# Patient Record
Sex: Female | Born: 1970 | Race: Black or African American | Hispanic: No | Marital: Married | Smoking: Never smoker
Health system: Southern US, Community
[De-identification: ages and names within clinical notes are randomized; demographics above are authoritative.]

## PROBLEM LIST (undated history)

## (undated) DIAGNOSIS — T7840XA Allergy, unspecified, initial encounter: Secondary | ICD-10-CM

## (undated) DIAGNOSIS — D649 Anemia, unspecified: Secondary | ICD-10-CM

## (undated) DIAGNOSIS — N879 Dysplasia of cervix uteri, unspecified: Secondary | ICD-10-CM

## (undated) DIAGNOSIS — F419 Anxiety disorder, unspecified: Secondary | ICD-10-CM

## (undated) DIAGNOSIS — R87619 Unspecified abnormal cytological findings in specimens from cervix uteri: Secondary | ICD-10-CM

## (undated) DIAGNOSIS — I10 Essential (primary) hypertension: Secondary | ICD-10-CM

## (undated) DIAGNOSIS — R519 Headache, unspecified: Secondary | ICD-10-CM

## (undated) DIAGNOSIS — K219 Gastro-esophageal reflux disease without esophagitis: Secondary | ICD-10-CM

## (undated) DIAGNOSIS — K5792 Diverticulitis of intestine, part unspecified, without perforation or abscess without bleeding: Secondary | ICD-10-CM

## (undated) DIAGNOSIS — N946 Dysmenorrhea, unspecified: Secondary | ICD-10-CM

## (undated) DIAGNOSIS — F329 Major depressive disorder, single episode, unspecified: Secondary | ICD-10-CM

## (undated) DIAGNOSIS — J45909 Unspecified asthma, uncomplicated: Secondary | ICD-10-CM

## (undated) DIAGNOSIS — M1990 Unspecified osteoarthritis, unspecified site: Secondary | ICD-10-CM

## (undated) DIAGNOSIS — A64 Unspecified sexually transmitted disease: Secondary | ICD-10-CM

## (undated) DIAGNOSIS — F32A Depression, unspecified: Secondary | ICD-10-CM

## (undated) DIAGNOSIS — R51 Headache: Secondary | ICD-10-CM

## (undated) DIAGNOSIS — K589 Irritable bowel syndrome without diarrhea: Secondary | ICD-10-CM

## (undated) HISTORY — DX: Anxiety disorder, unspecified: F41.9

## (undated) HISTORY — DX: Dysmenorrhea, unspecified: N94.6

## (undated) HISTORY — DX: Essential (primary) hypertension: I10

## (undated) HISTORY — PX: COLONOSCOPY: SHX174

## (undated) HISTORY — DX: Unspecified osteoarthritis, unspecified site: M19.90

## (undated) HISTORY — DX: Unspecified abnormal cytological findings in specimens from cervix uteri: R87.619

## (undated) HISTORY — DX: Anemia, unspecified: D64.9

## (undated) HISTORY — PX: ADENOIDECTOMY: SUR15

## (undated) HISTORY — DX: Gastro-esophageal reflux disease without esophagitis: K21.9

## (undated) HISTORY — PX: TUBAL LIGATION: SHX77

## (undated) HISTORY — DX: Depression, unspecified: F32.A

## (undated) HISTORY — DX: Diverticulitis of intestine, part unspecified, without perforation or abscess without bleeding: K57.92

## (undated) HISTORY — DX: Irritable bowel syndrome, unspecified: K58.9

## (undated) HISTORY — PX: ABDOMINAL HYSTERECTOMY: SHX81

## (undated) HISTORY — DX: Allergy, unspecified, initial encounter: T78.40XA

## (undated) HISTORY — DX: Unspecified sexually transmitted disease: A64

## (undated) HISTORY — DX: Major depressive disorder, single episode, unspecified: F32.9

## (undated) HISTORY — PX: TONSILLECTOMY: SUR1361

---
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
## 1990-08-06 HISTORY — PX: COLPOSCOPY: SHX161

## 1991-08-07 DIAGNOSIS — N879 Dysplasia of cervix uteri, unspecified: Secondary | ICD-10-CM

## 1991-08-07 HISTORY — DX: Dysplasia of cervix uteri, unspecified: N87.9

## 2002-02-17 ENCOUNTER — Other Ambulatory Visit: Admission: RE | Admit: 2002-02-17 | Discharge: 2002-02-17 | Payer: Self-pay | Admitting: *Deleted

## 2014-03-26 ENCOUNTER — Ambulatory Visit: Payer: Self-pay | Admitting: Obstetrics and Gynecology

## 2015-03-03 ENCOUNTER — Other Ambulatory Visit: Payer: Self-pay | Admitting: *Deleted

## 2015-03-03 DIAGNOSIS — Z1231 Encounter for screening mammogram for malignant neoplasm of breast: Secondary | ICD-10-CM

## 2015-03-28 ENCOUNTER — Ambulatory Visit: Payer: Self-pay | Attending: Family Medicine

## 2015-06-03 ENCOUNTER — Ambulatory Visit
Admission: RE | Admit: 2015-06-03 | Discharge: 2015-06-03 | Disposition: A | Discharge status: Home / Self Care | Payer: Managed Care, Other (non HMO) | Source: Ambulatory Visit | Attending: Family Medicine | Admitting: Family Medicine

## 2015-06-03 ENCOUNTER — Other Ambulatory Visit: Payer: Self-pay | Admitting: Family Medicine

## 2015-06-03 DIAGNOSIS — R7611 Nonspecific reaction to tuberculin skin test without active tuberculosis: Secondary | ICD-10-CM

## 2015-08-02 ENCOUNTER — Ambulatory Visit
Admission: RE | Admit: 2015-08-02 | Discharge: 2015-08-02 | Disposition: A | Discharge status: Home / Self Care | Payer: Managed Care, Other (non HMO) | Source: Ambulatory Visit | Attending: Family Medicine | Admitting: Family Medicine

## 2015-08-02 DIAGNOSIS — Z1231 Encounter for screening mammogram for malignant neoplasm of breast: Secondary | ICD-10-CM | POA: Insufficient documentation

## 2015-11-11 ENCOUNTER — Ambulatory Visit (INDEPENDENT_AMBULATORY_CARE_PROVIDER_SITE_OTHER): Payer: 59 | Admitting: Primary Care

## 2015-11-11 ENCOUNTER — Encounter: Payer: Self-pay | Admitting: Primary Care

## 2015-11-11 VITALS — BP 148/98 | HR 72 | Temp 97.7°F | Ht 62.0 in | Wt 211.0 lb

## 2015-11-11 DIAGNOSIS — K219 Gastro-esophageal reflux disease without esophagitis: Secondary | ICD-10-CM | POA: Diagnosis not present

## 2015-11-11 DIAGNOSIS — I10 Essential (primary) hypertension: Secondary | ICD-10-CM | POA: Diagnosis not present

## 2015-11-11 MED ORDER — HYDROCHLOROTHIAZIDE 25 MG PO TABS: 25.0000 mg | ORAL_TABLET | Freq: Every day | ORAL | Status: DC

## 2015-11-11 NOTE — Progress Notes (Signed)
Pre visit review using our clinic review tool, if applicable. No additional management support is needed unless otherwise documented below in the visit note. 

## 2015-11-11 NOTE — Patient Instructions (Signed)
Start Hydrochlorothiazide 25 mg tablets for hypertension. Take 1 tablet by mouth once every morning.  Check your blood pressure daily, around the same time of day, for the next 2 weeks.   Ensure that you have rested for 30 minutes prior to checking your blood pressure. Record your readings and bring them to your next visit.  Schedule a follow up appointment in 2 weeks for re-evaluation.   It was a pleasure to meet you today! Please don't hesitate to call me with any questions. Welcome to [facility]!  DASH Eating Plan DASH stands for "Dietary Approaches to Stop Hypertension." The DASH eating plan is a healthy eating plan that has been shown to reduce high blood pressure (hypertension). Additional health benefits may include reducing the risk of type 2 diabetes mellitus, heart disease, and stroke. The DASH eating plan may also help with weight loss. WHAT DO I NEED TO KNOW ABOUT THE DASH EATING PLAN? For the DASH eating plan, you will follow these general guidelines:  Choose foods with a percent daily value for sodium of less than 5% (as listed on the food label).  Use salt-free seasonings or herbs instead of table salt or sea salt.  Check with your health care provider or pharmacist before using salt substitutes.  Eat lower-sodium products, often labeled as "lower sodium" or "no salt added."  Eat fresh foods.  Eat more vegetables, fruits, and low-fat dairy products.  Choose whole grains. Look for the word "whole" as the first word in the ingredient list.  Choose fish and skinless chicken or [place] more often than red meat. Limit fish, poultry, and meat to 6 oz (170 g) each day.  Limit sweets, desserts, sugars, and sugary drinks.  Choose heart-healthy fats.  Limit cheese to 1 oz (28 g) per day.  Eat more home-cooked food and less restaurant, buffet, and fast food.  Limit fried foods.  Cook foods using methods other than frying.  Limit canned vegetables. If you do use them, rinse  them well to decrease the sodium.  When eating at a restaurant, ask that your food be prepared with less salt, or no salt if possible. WHAT FOODS CAN I EAT? Seek help from a dietitian for individual calorie needs. Grains Whole grain or whole wheat bread. Brown rice. Whole grain or whole wheat pasta. Quinoa, bulgur, and whole grain cereals. Low-sodium cereals. Corn or whole wheat flour tortillas. Whole grain cornbread. Whole grain crackers. Low-sodium crackers. Vegetables Fresh or frozen vegetables (raw, steamed, roasted, or grilled). Low-sodium or reduced-sodium tomato and vegetable juices. Low-sodium or reduced-sodium tomato sauce and paste. Low-sodium or reduced-sodium canned vegetables.  Fruits All fresh, canned (in natural juice), or frozen fruits. Meat and Other Protein Products Ground beef (85% or leaner), grass-fed beef, or beef trimmed of fat. Skinless chicken or [place]. Ground chicken or [place]. Pork trimmed of fat. All fish and seafood. Eggs. Dried beans, peas, or lentils. Unsalted nuts and seeds. Unsalted canned beans. Dairy Low-fat dairy products, such as skim or 1% milk, 2% or reduced-fat cheeses, low-fat ricotta or cottage cheese, or plain low-fat yogurt. Low-sodium or reduced-sodium cheeses. Fats and Oils Tub margarines without trans fats. Light or reduced-fat mayonnaise and salad dressings (reduced sodium). Avocado. Safflower, olive, or canola oils. Natural peanut or almond butter. Other Unsalted popcorn and pretzels. The items listed above may not be a complete list of recommended foods or beverages. Contact your dietitian for more options. WHAT FOODS ARE NOT RECOMMENDED? Grains White bread. White pasta. White rice. Refined cornbread.  Bagels and croissants. Crackers that contain trans fat. Vegetables Creamed or fried vegetables. Vegetables in a cheese sauce. Regular canned vegetables. Regular canned tomato sauce and paste. Regular tomato and vegetable juices. Fruits Dried  fruits. Canned fruit in light or heavy syrup. Fruit juice. Meat and Other Protein Products Fatty cuts of meat. Ribs, chicken wings, bacon, sausage, bologna, salami, chitterlings, fatback, hot dogs, bratwurst, and packaged luncheon meats. Salted nuts and seeds. Canned beans with salt. Dairy Whole or 2% milk, cream, half-and-half, and cream cheese. Whole-fat or sweetened yogurt. Full-fat cheeses or blue cheese. Nondairy creamers and whipped toppings. Processed cheese, cheese spreads, or cheese curds. Condiments Onion and garlic salt, seasoned salt, table salt, and sea salt. Canned and packaged gravies. Worcestershire sauce. Tartar sauce. Barbecue sauce. Teriyaki sauce. Soy sauce, including reduced sodium. Steak sauce. Fish sauce. Oyster sauce. Cocktail sauce. Horseradish. Ketchup and mustard. Meat flavorings and tenderizers. Bouillon cubes. Hot sauce. Tabasco sauce. Marinades. Taco seasonings. Relishes. Fats and Oils Butter, stick margarine, lard, shortening, ghee, and bacon fat. Coconut, palm kernel, or palm oils. Regular salad dressings. Other Pickles and olives. Salted popcorn and pretzels. The items listed above may not be a complete list of foods and beverages to avoid. Contact your dietitian for more information. WHERE CAN I FIND MORE INFORMATION? National Heart, Lung, and Blood Institute: CableProm[employer].itwww.nhlbi.nih.gov/health/health-topics/topics/dash/   This information is not intended to replace advice given to you by your health care provider. Make sure you discuss any questions you have with your health care provider.   Document Released: 07/12/2011 Document Revised: 08/13/2014 Document Reviewed: 05/27/2013 Elsevier Interactive Patient Education [employer] Elsevier Inc.

## 2015-11-11 NOTE — Progress Notes (Signed)
Subjective:    Patient ID: Kristen [name], female    DOB: 1971/05/15, 45 y.o.   MRN: [id number]  HPI  Kristen [name] is a 45 year old female who presents today to establish care and discuss the problems mentioned below. Will obtain old records. Her last physical was August 2017.   1) Elevated Blood Pressure: History of elevated readings for the past 7 years after postpartum with her daughter. She was told by her prior PCP that she needed to lose weight. She was provided with samples of an unknown BP medication that caused dizziness. She has a family history of hypertension in her mother and grandmother. She endorses a poor diet and does not exercise.  Yesterday at work she started to experience dizziness, headache, and feeling "off". She had her BP checked at work shortly after symptom onset which was 168/108, and 150/102 several hours later upon recheck. Last night she checked her BP when she got home which was 146/98. This morning her blood pressure was 136/98 and then140/94 this afternoon. Today she's continued to notice her symptoms of neck pain, feeling "off" and headache. Denies chest pain.   2) GERD: Diagnosed in 2012. She takes pantoprazole 40 mg as needed for acid reflux and has been on since 2013. She tries to control her diet to prevent symptoms. her symptoms consist of severe esophageal burning, mostly at night occasionally. She will experience attacks twice monthly.   3) IBS: Constipation type. Diagnosed in 2015. She was once managed on samples of Linzess but has no constipation issues now. Her symptoms consist of abdominal cramping and bloating. She will experience these symptoms once to twice weekly depending on her diet.  Review of Systems  Respiratory: Negative for shortness of breath.   Cardiovascular: Negative for chest pain.  Gastrointestinal: Negative for abdominal pain and constipation.  Neurological: Positive for headaches. Negative for dizziness and numbness.       Past  Medical History  Diagnosis Date  . GERD (gastroesophageal reflux disease)   . Essential hypertension   . IBS (irritable bowel syndrome)     Social History   Social History  . Marital Status: Legally Separated    Spouse Name: N/A  . Number of Children: N/A  . Years of Education: N/A   Occupational History  . Not on file.   Social History Main Topics  . Smoking status: Never Smoker   . Smokeless tobacco: Not on file  . Alcohol Use: No  . Drug Use: No  . Sexual Activity: Not on file   Other Topics Concern  . Not on file   Social History Narrative   Separated.    3 children.    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     No past surgical history on file.  Family History  Problem Relation Age of Onset  . Hypertension Mother   . Diabetes Maternal Grandmother   . Hypertension Maternal Grandmother   . Colon cancer Father [phone]  . Depression Mother     Allergies  Allergen Reactions  . Penicillin G Rash    No current outpatient prescriptions on file prior to visit.   No current facility-administered medications on file prior to visit.    BP 148/98 mmHg  Pulse 72  Temp(Src) 97.7 F (36.5 C) (Oral)  Ht 5\' 2"  (1.575 m)  Wt 211 lb (95.709 kg)  BMI 38.58 kg/m2  SpO2 98%  LMP 11/06/2015  Objective:   Physical Exam  Constitutional: She appears well-nourished.  Neck: Neck supple.  Cardiovascular: Normal rate and regular rhythm.   Pulmonary/Chest: Effort normal and breath sounds normal.  Skin: Skin is warm and dry.  Psychiatric: She has a normal mood and affect.          Assessment & Plan:

## 2015-11-11 NOTE — Assessment & Plan Note (Signed)
Long history of elevated readings. BP above goal with readings she brought from recent episode. BP above goal in clinic. Will initiate HCTZ 25 mg once daily. Will have her monitor BP and follow up in 2 weeks. BMP next visit.

## 2015-11-11 NOTE — Assessment & Plan Note (Signed)
Diagnosed in 2012, managed on pantoprazole PRN. She is aware of triggers and attempts to prevent with her diet. Will continue to monitor.

## 2015-11-14 ENCOUNTER — Ambulatory Visit: Payer: Managed Care, Other (non HMO) | Admitting: Primary Care

## 2015-11-19 DIAGNOSIS — H43812 Vitreous degeneration, left eye: Secondary | ICD-10-CM | POA: Diagnosis not present

## 2015-11-25 ENCOUNTER — Other Ambulatory Visit (INDEPENDENT_AMBULATORY_CARE_PROVIDER_SITE_OTHER): Payer: 59

## 2015-11-25 DIAGNOSIS — I10 Essential (primary) hypertension: Secondary | ICD-10-CM

## 2015-11-25 LAB — BASIC METABOLIC PANEL
BUN: 14 mg/dL (ref 6–23)
CO2: 27 mEq/L (ref 19–32)
Calcium: 9.1 mg/dL (ref 8.4–10.5)
Chloride: 107 mEq/L (ref 96–112)
Creatinine, Ser: 0.86 mg/dL (ref 0.40–1.20)
GFR: 92.05 mL/min (ref >60.00)
Glucose, Bld: 88 mg/dL (ref 70–99)
Potassium: 4.3 mEq/L (ref 3.5–5.1)
Sodium: 140 mEq/L (ref 135–145)

## 2015-11-29 ENCOUNTER — Ambulatory Visit (INDEPENDENT_AMBULATORY_CARE_PROVIDER_SITE_OTHER): Payer: 59 | Admitting: Primary Care

## 2015-11-29 ENCOUNTER — Encounter: Payer: Self-pay | Admitting: Primary Care

## 2015-11-29 VITALS — BP 120/72 | HR 74 | Temp 98.0°F | Ht 62.0 in | Wt 209.4 lb

## 2015-11-29 DIAGNOSIS — G4700 Insomnia, unspecified: Secondary | ICD-10-CM | POA: Diagnosis not present

## 2015-11-29 DIAGNOSIS — I10 Essential (primary) hypertension: Secondary | ICD-10-CM | POA: Diagnosis not present

## 2015-11-29 NOTE — Assessment & Plan Note (Signed)
Improved on HCTZ 25 mg. BMP unremarkable. Continue current regimen, discussed improvements in diet to help with BP levels.

## 2015-11-29 NOTE — Progress Notes (Signed)
Pre visit review using our clinic review tool, if applicable. No additional management support is needed unless otherwise documented below in the visit note. 

## 2015-11-29 NOTE — Progress Notes (Signed)
Subjective:    Patient ID: Kristen [name], female    DOB: Dec 01, 1970, 45 y.o.   MRN: [id number]  HPI  Ms. Kristen [name] is a 45 year old female who presents today for follow up of hypertension. She was diagnosed 2 weeks ago after numerous elevated readings at work and home. She was initiated on HCTZ 25 mg last visit.   Since her last visit she is checking her blood pressure at home and is getting readings of 130's/80 on average. Her BP is stable today in the clinic. She has noticed floaters to her left eye for the past several weeks. She underwent evaluation with her eye doctor 1 week ago which was unremarkable. She is due to see a retinal specialist. Denies chest pain, dizziness, shortness of breath.   2) Insomnia: Long history of difficulty staying asleep. She has no difficulty falling asleep, but will wake up around 2 am every morning. She will get about 4 hours of sleep on average. She has tried melatonin in the past which caused "crazy dreams". She did get a full nights sleep last night after working out.   Review of Systems  Eyes: Positive for visual disturbance.  Respiratory: Negative for shortness of breath.   Cardiovascular: Negative for chest pain and leg swelling.  Neurological: Negative for dizziness and headaches.  Psychiatric/Behavioral: Positive for sleep disturbance. The patient is not nervous/anxious.        Past Medical History  Diagnosis Date  . GERD (gastroesophageal reflux disease)   . Essential hypertension   . IBS (irritable bowel syndrome)      Social History   Social History  . Marital Status: Legally Separated    Spouse Name: N/A  . Number of Children: N/A  . Years of Education: N/A   Occupational History  . Not on file.   Social History Main Topics  . Smoking status: Never Smoker   . Smokeless tobacco: Not on file  . Alcohol Use: No  . Drug Use: No  . Sexual Activity: Not on file   Other Topics Concern  . Not on file   Social History Narrative     Separated.    3 children.    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     No past surgical history on file.  Family History  Problem Relation Age of Onset  . Hypertension Mother   . Diabetes Maternal Grandmother   . Hypertension Maternal Grandmother   . Colon cancer Father [phone]  . Depression Mother     Allergies  Allergen Reactions  . Penicillin G Rash    Current Outpatient Prescriptions on File Prior to Visit  Medication Sig Dispense Refill  . hydrochlorothiazide (HYDRODIURIL) 25 MG tablet Take 1 tablet (25 mg total) by mouth daily. 30 tablet 3  . pantoprazole (PROTONIX) 40 MG tablet Take 40 mg by mouth daily.  5   No current facility-administered medications on file prior to visit.    BP 120/72 mmHg  Pulse 74  Temp(Src) 98 F (36.7 C) (Oral)  Ht 5\' 2"  (1.575 m)  Wt 209 lb 6.4 oz (94.983 kg)  BMI 38.29 kg/m2  SpO2 98%  LMP 11/06/2015    Objective:   Physical Exam  Constitutional: She appears well-nourished.  Cardiovascular: Normal rate and regular rhythm.   Pulmonary/Chest: Effort normal and breath sounds normal.  Skin: Skin is warm and dry.          Assessment &  Plan:

## 2015-11-29 NOTE — Assessment & Plan Note (Signed)
Long history of staying asleep.  Typically sleeps 4 hours per night as she will wake around 2 am. No improvement with Melatonin in the past. Will have her continue work outs as this improved sleep. Discussed importance of bedtime routine. Will have her try Unisom OTC if no improvement.

## 2015-11-29 NOTE — Patient Instructions (Addendum)
Your blood pressure is much improved. Continue HCTZ 25 mg tablets once daily.  Please notify me if no improvement in your sleep. You could try Unisom, which is an over the counter sleep aid.  Follow up in August 2017 for your annual physical.  It was a pleasure to see you today!  DASH Eating Plan DASH stands for "Dietary Approaches to Stop Hypertension." The DASH eating plan is a healthy eating plan that has been shown to reduce high blood pressure (hypertension). Additional health benefits may include reducing the risk of type 2 diabetes mellitus, heart disease, and stroke. The DASH eating plan may also help with weight loss. WHAT DO I NEED TO KNOW ABOUT THE DASH EATING PLAN? For the DASH eating plan, you will follow these general guidelines:  Choose foods with a percent daily value for sodium of less than 5% (as listed on the food label).  Use salt-free seasonings or herbs instead of table salt or sea salt.  Check with your health care provider or pharmacist before using salt substitutes.  Eat lower-sodium products, often labeled as "lower sodium" or "no salt added."  Eat fresh foods.  Eat more vegetables, fruits, and low-fat dairy products.  Choose whole grains. Look for the word "whole" as the first word in the ingredient list.  Choose fish and skinless chicken or [place] more often than red meat. Limit fish, poultry, and meat to 6 oz (170 g) each day.  Limit sweets, desserts, sugars, and sugary drinks.  Choose heart-healthy fats.  Limit cheese to 1 oz (28 g) per day.  Eat more home-cooked food and less restaurant, buffet, and fast food.  Limit fried foods.  Cook foods using methods other than frying.  Limit canned vegetables. If you do use them, rinse them well to decrease the sodium.  When eating at a restaurant, ask that your food be prepared with less salt, or no salt if possible. WHAT FOODS CAN I EAT? Seek help from a dietitian for individual calorie  needs. Grains Whole grain or whole wheat bread. Brown rice. Whole grain or whole wheat pasta. Quinoa, bulgur, and whole grain cereals. Low-sodium cereals. Corn or whole wheat flour tortillas. Whole grain cornbread. Whole grain crackers. Low-sodium crackers. Vegetables Fresh or frozen vegetables (raw, steamed, roasted, or grilled). Low-sodium or reduced-sodium tomato and vegetable juices. Low-sodium or reduced-sodium tomato sauce and paste. Low-sodium or reduced-sodium canned vegetables.  Fruits All fresh, canned (in natural juice), or frozen fruits. Meat and Other Protein Products Ground beef (85% or leaner), grass-fed beef, or beef trimmed of fat. Skinless chicken or [place]. Ground chicken or [place]. Pork trimmed of fat. All fish and seafood. Eggs. Dried beans, peas, or lentils. Unsalted nuts and seeds. Unsalted canned beans. Dairy Low-fat dairy products, such as skim or 1% milk, 2% or reduced-fat cheeses, low-fat ricotta or cottage cheese, or plain low-fat yogurt. Low-sodium or reduced-sodium cheeses. Fats and Oils Tub margarines without trans fats. Light or reduced-fat mayonnaise and salad dressings (reduced sodium). Avocado. Safflower, olive, or canola oils. Natural peanut or almond butter. Other Unsalted popcorn and pretzels. The items listed above may not be a complete list of recommended foods or beverages. Contact your dietitian for more options. WHAT FOODS ARE NOT RECOMMENDED? Grains White bread. White pasta. White rice. Refined cornbread. Bagels and croissants. Crackers that contain trans fat. Vegetables Creamed or fried vegetables. Vegetables in a cheese sauce. Regular canned vegetables. Regular canned tomato sauce and paste. Regular tomato and vegetable juices. Fruits Dried fruits. Canned fruit  in light or heavy syrup. Fruit juice. Meat and Other Protein Products Fatty cuts of meat. Ribs, chicken wings, bacon, sausage, bologna, salami, chitterlings, fatback, hot dogs, bratwurst,  and packaged luncheon meats. Salted nuts and seeds. Canned beans with salt. Dairy Whole or 2% milk, cream, half-and-half, and cream cheese. Whole-fat or sweetened yogurt. Full-fat cheeses or blue cheese. Nondairy creamers and whipped toppings. Processed cheese, cheese spreads, or cheese curds. Condiments Onion and garlic salt, seasoned salt, table salt, and sea salt. Canned and packaged gravies. Worcestershire sauce. Tartar sauce. Barbecue sauce. Teriyaki sauce. Soy sauce, including reduced sodium. Steak sauce. Fish sauce. Oyster sauce. Cocktail sauce. Horseradish. Ketchup and mustard. Meat flavorings and tenderizers. Bouillon cubes. Hot sauce. Tabasco sauce. Marinades. Taco seasonings. Relishes. Fats and Oils Butter, stick margarine, lard, shortening, ghee, and bacon fat. Coconut, palm kernel, or palm oils. Regular salad dressings. Other Pickles and olives. Salted popcorn and pretzels. The items listed above may not be a complete list of foods and beverages to avoid. Contact your dietitian for more information. WHERE CAN I FIND MORE INFORMATION? National Heart, Lung, and Blood Institute: travelstabloid.com   This information is not intended to replace advice given to you by your health care provider. Make sure you discuss any questions you have with your health care provider.   Document Released: 07/12/2011 Document Revised: 08/13/2014 Document Reviewed: 05/27/2013 Elsevier Interactive Patient Education [employer].

## 2015-12-30 ENCOUNTER — Other Ambulatory Visit: Payer: Self-pay | Admitting: Primary Care

## 2015-12-30 ENCOUNTER — Other Ambulatory Visit (INDEPENDENT_AMBULATORY_CARE_PROVIDER_SITE_OTHER): Payer: 59

## 2015-12-30 DIAGNOSIS — I10 Essential (primary) hypertension: Secondary | ICD-10-CM

## 2015-12-30 DIAGNOSIS — H43812 Vitreous degeneration, left eye: Secondary | ICD-10-CM | POA: Diagnosis not present

## 2015-12-30 DIAGNOSIS — Z131 Encounter for screening for diabetes mellitus: Secondary | ICD-10-CM

## 2015-12-30 DIAGNOSIS — H02539 Eyelid retraction unspecified eye, unspecified lid: Secondary | ICD-10-CM | POA: Diagnosis not present

## 2015-12-30 DIAGNOSIS — H43392 Other vitreous opacities, left eye: Secondary | ICD-10-CM | POA: Diagnosis not present

## 2015-12-30 DIAGNOSIS — Z0000 Encounter for general adult medical examination without abnormal findings: Secondary | ICD-10-CM

## 2015-12-30 LAB — LIPID PANEL
Cholesterol: 122 mg/dL (ref 0–200)
HDL: 37.1 mg/dL — ABNORMAL LOW (ref >39.00)
LDL Cholesterol: 69 mg/dL (ref 0–99)
NonHDL: 84.49
Total CHOL/HDL Ratio: 3
Triglycerides: 77 mg/dL (ref 0.0–149.0)
VLDL: 15.4 mg/dL (ref 0.0–40.0)

## 2015-12-30 LAB — HEPATIC FUNCTION PANEL
ALT: 13 U/L (ref 0–35)
AST: 16 U/L (ref 0–37)
Albumin: 4 g/dL (ref 3.5–5.2)
Alkaline Phosphatase: 33 U/L — ABNORMAL LOW (ref 39–117)
Bilirubin, Direct: 0.1 mg/dL (ref 0.0–0.3)
Total Bilirubin: 0.4 mg/dL (ref 0.2–1.2)
Total Protein: 7.1 g/dL (ref 6.0–8.3)

## 2015-12-30 LAB — HEMOGLOBIN A1C: Hgb A1c MFr Bld: 5.3 % (ref 4.6–6.5)

## 2015-12-30 LAB — CBC
HCT: 36.9 % (ref 36.0–46.0)
Hemoglobin: 12.3 g/dL (ref 12.0–15.0)
MCHC: 33.5 g/dL (ref 30.0–36.0)
MCV: 95.7 fl (ref 78.0–100.0)
Platelets: 367 10*3/uL (ref 150.0–400.0)
RBC: 3.85 Mil/uL — ABNORMAL LOW (ref 3.87–5.11)
RDW: 13.2 % (ref 11.5–15.5)
WBC: 6.4 10*3/uL (ref 4.0–10.5)

## 2015-12-30 LAB — VITAMIN D 25 HYDROXY (VIT D DEFICIENCY, FRACTURES): VITD: 32.03 ng/mL (ref 30.00–100.00)

## 2015-12-30 LAB — TSH: TSH: 2.43 u[IU]/mL (ref 0.35–4.50)

## 2016-01-03 ENCOUNTER — Encounter: Payer: Self-pay | Admitting: Primary Care

## 2016-01-03 ENCOUNTER — Ambulatory Visit (INDEPENDENT_AMBULATORY_CARE_PROVIDER_SITE_OTHER): Payer: 59 | Admitting: Primary Care

## 2016-01-03 VITALS — BP 136/86 | HR 75 | Temp 98.1°F | Ht 62.0 in | Wt 210.8 lb

## 2016-01-03 DIAGNOSIS — H0520 Unspecified exophthalmos: Secondary | ICD-10-CM | POA: Diagnosis not present

## 2016-01-03 DIAGNOSIS — I10 Essential (primary) hypertension: Secondary | ICD-10-CM | POA: Diagnosis not present

## 2016-01-03 DIAGNOSIS — Z0000 Encounter for general adult medical examination without abnormal findings: Secondary | ICD-10-CM | POA: Diagnosis not present

## 2016-01-03 NOTE — Assessment & Plan Note (Signed)
Stable on HCTZ 25 mg. Continue same. Discussed the importance of a healthy diet and regular exercise in order for weight loss and to reduce risk of other medical diseases. Commended her on exercise.

## 2016-01-03 NOTE — Progress Notes (Signed)
Pre visit review using our clinic review tool, if applicable. No additional management support is needed unless otherwise documented below in the visit note. 

## 2016-01-03 NOTE — Patient Instructions (Addendum)
It is important that you improve your diet. Please limit carbohydrates in the form of white bread, rice, pasta, cakes, cookies, sugary drinks, fast food, etc. Increase your consumption of fresh fruits and vegetables.  Ensure you are consuming 64 ounces of water daily.  Start exercising. You should be getting 1 hour of moderate intensity exercise 5 days weekly.  Follow up in 1 year for repeat physical or sooner if needed.  It was a pleasure to see you today!  Heart-Healthy Eating Plan Many factors influence your heart health, including eating and exercise habits. Heart (coronary) risk increases with abnormal blood fat (lipid) levels. Heart-healthy meal planning includes limiting unhealthy fats, increasing healthy fats, and making other small dietary changes. This includes maintaining a healthy body weight to help keep lipid levels within a normal range. WHAT IS MY PLAN?  Your health care provider recommends that you:  Get no more than _________% of the total calories in your daily diet from fat.  Limit your intake of saturated fat to less than _________% of your total calories each day.  Limit the amount of cholesterol in your diet to less than _________ mg per day. WHAT TYPES OF FAT SHOULD I CHOOSE?  Choose healthy fats more often. Choose monounsaturated and polyunsaturated fats, such as olive oil and canola oil, flaxseeds, walnuts, almonds, and seeds.  Eat more omega-3 fats. Good choices include salmon, mackerel, sardines, tuna, flaxseed oil, and ground flaxseeds. Aim to eat fish at least two times each week.  Limit saturated fats. Saturated fats are primarily found in animal products, such as meats, butter, and cream. Plant sources of saturated fats include palm oil, palm kernel oil, and coconut oil.  Avoid foods with partially hydrogenated oils in them. These contain trans fats. Examples of foods that contain trans fats are stick margarine, some tub margarines, cookies, crackers, and  other baked goods. WHAT GENERAL GUIDELINES DO I NEED TO FOLLOW?  Check food labels carefully to identify foods with trans fats or high amounts of saturated fat.  Fill one half of your plate with vegetables and green salads. Eat 4-5 servings of vegetables per day. A serving of vegetables equals 1 cup of raw leafy vegetables,  cup of raw or cooked cut-up vegetables, or  cup of vegetable juice.  Fill one fourth of your plate with whole grains. Look for the word "whole" as the first word in the ingredient list.  Fill one fourth of your plate with lean protein foods.  Eat 4-5 servings of fruit per day. A serving of fruit equals one medium whole fruit,  cup of dried fruit,  cup of fresh, frozen, or canned fruit, or  cup of 100% fruit juice.  Eat more foods that contain soluble fiber. Examples of foods that contain this type of fiber are apples, broccoli, carrots, beans, peas, and barley. Aim to get 20-30 g of fiber per day.  Eat more home-cooked food and less restaurant, buffet, and fast food.  Limit or avoid alcohol.  Limit foods that are high in starch and sugar.  Avoid fried foods.  Cook foods by using methods other than frying. Baking, boiling, grilling, and broiling are all great options. Other fat-reducing suggestions include:  Removing the skin from poultry.  Removing all visible fats from meats.  Skimming the fat off of stews, soups, and gravies before serving them.  Steaming vegetables in water or broth.  Lose weight if you are overweight. Losing just 5-10% of your initial body weight can help your  overall health and prevent diseases such as diabetes and heart disease.  Increase your consumption of nuts, legumes, and seeds to 4-5 servings per week. One serving of dried beans or legumes equals  cup after being cooked, one serving of nuts equals 1 ounces, and one serving of seeds equals  ounce or 1 tablespoon.  You may need to monitor your salt (sodium) intake,  especially if you have high blood pressure. Talk with your health care provider or dietitian to get more information about reducing sodium. WHAT FOODS CAN I EAT? Grains Breads, including [place], white, pita, wheat, raisin, rye, oatmeal, and [place]. Tortillas that are neither fried nor made with lard or trans fat. Low-fat rolls, including hotdog and hamburger buns and English muffins. Biscuits. Muffins. Waffles. Pancakes. Light popcorn. Whole-grain cereals. Flatbread. Melba toast. Pretzels. Breadsticks. Rusks. Low-fat snacks and crackers, including oyster, saltine, matzo, graham, animal, and rye. Rice and pasta, including brown rice and those that are made with whole wheat. Vegetables All vegetables. Fruits All fruits, but limit coconut. Meats and Other Protein Sources Lean, well-trimmed beef, veal, pork, and lamb. Chicken and [place] without skin. All fish and shellfish. Wild duck, rabbit, pheasant, and venison. Egg whites or low-cholesterol egg substitutes. Dried beans, peas, lentils, and tofu.Seeds and most nuts. Dairy Low-fat or nonfat cheeses, including ricotta, string, and mozzarella. Skim or 1% milk that is liquid, powdered, or evaporated. Buttermilk that is made with low-fat milk. Nonfat or low-fat yogurt. Beverages Mineral water. Diet carbonated beverages. Sweets and Desserts Sherbets and fruit ices. Honey, jam, marmalade, jelly, and syrups. Meringues and gelatins. Pure sugar candy, such as hard candy, jelly beans, gumdrops, mints, marshmallows, and small amounts of dark chocolate. [employer]. Eat all sweets and desserts in moderation. Fats and Oils Nonhydrogenated (trans-free) margarines. Vegetable oils, including soybean, sesame, sunflower, olive, peanut, safflower, corn, canola, and cottonseed. Salad dressings or mayonnaise that are made with a vegetable oil. Limit added fats and oils that you use for cooking, baking, salads, and as spreads. Other Cocoa powder. Coffee and tea.  All seasonings and condiments. The items listed above may not be a complete list of recommended foods or beverages. Contact your dietitian for more options. WHAT FOODS ARE NOT RECOMMENDED? Grains Breads that are made with saturated or trans fats, oils, or whole milk. Croissants. Butter rolls. Cheese breads. Sweet rolls. Donuts. Buttered popcorn. Chow mein noodles. High-fat crackers, such as cheese or butter crackers. Meats and Other Protein Sources Fatty meats, such as hotdogs, short ribs, sausage, spareribs, bacon, ribeye roast or steak, and mutton. High-fat deli meats, such as salami and bologna. Caviar. Domestic duck and goose. Organ meats, such as kidney, liver, sweetbreads, brains, gizzard, chitterlings, and heart. Dairy Cream, sour cream, cream cheese, and creamed cottage cheese. Whole milk cheeses, including blue (bleu), [address], [place], [place], [address], [place], [address], [place], [place], and [place]. Whole or 2% milk that is liquid, evaporated, or condensed. Whole buttermilk. Cream sauce or high-fat cheese sauce. Yogurt that is made from whole milk. Beverages Regular sodas and drinks with added sugar. Sweets and Desserts Frosting. Pudding. Cookies. Cakes other than angel food cake. Candy that has milk chocolate or white chocolate, hydrogenated fat, butter, coconut, or unknown ingredients. Buttered syrups. Full-fat ice cream or ice cream drinks. Fats and Oils Gravy that has suet, meat fat, or shortening. Cocoa butter, hydrogenated oils, palm oil, coconut oil, palm kernel oil. These can often be found in baked products, candy, fried foods, nondairy creamers, and whipped toppings. Solid fats and  shortenings, including bacon fat, salt pork, lard, and butter. Nondairy cream substitutes, such as coffee creamers and sour cream substitutes. Salad dressings that are made of unknown oils, cheese, or sour cream. The items listed above may not be a complete list of foods and beverages to avoid.  Contact your dietitian for more information.   This information is not intended to replace advice given to you by your health care provider. Make sure you discuss any questions you have with your health care provider.   Document Released: 05/01/2008 Document Revised: 08/13/2014 Document Reviewed: 01/14/2014 Elsevier Interactive Patient Education [employer]2016 Elsevier Inc.

## 2016-01-03 NOTE — Assessment & Plan Note (Signed)
Td due, will [facility] with [facility] as she was hired in January 2017. Pap and mammogram UTD. Discussed the importance of a healthy diet and regular exercise in order for weight loss and to reduce risk of other medical diseases. Labs unremarkable. Exam with goiter and mild exophthalmos. recently evaluated by optometrist who recommends further testing. TSH unremarkable, T3 and T4 pending.  Follow up in 1 year for repeat physical.

## 2016-01-03 NOTE — Progress Notes (Signed)
Subjective:    Patient ID: Kristen [name], female    DOB: [birth year], 45 y.o.   MRN: [id number]  HPI  Kristen [name] is a 45 year old female who presents today for complete physical.  Immunizations: -Tetanus: Unsure. Believes it was in 2006. -Influenza: Completed in January 2017.   Diet: She endorses a fair diet. Breakfast: Eggs, bacon Lunch: Fast food, sometimes makes healthier choices. Dinner: [employer], [employer]. Snacks: Fruit, applesauce, chips, junk food. Desserts: Once daily Beverages: Water, occasionally sweet tea  Exercise: She was once exercising three days weekly that she recently started. Eye exam: Completed in May 2017. Dental exam: Completed 1 year ago. Pap Smear: Completed August 2016, normal. Mammogram: Completed December 2016.   Review of Systems  Constitutional: Negative for unexpected weight change.  HENT: Negative for rhinorrhea.   Respiratory: Negative for cough and shortness of breath.   Cardiovascular: Negative for chest pain.  Gastrointestinal: Negative for diarrhea and constipation.  Genitourinary: Negative for difficulty urinating and menstrual problem.       Regular periods, heavy  Musculoskeletal: Negative for myalgias and arthralgias.  Skin: Negative for rash.  Allergic/Immunologic: Negative for environmental allergies.  Neurological: Negative for dizziness, numbness and headaches.  Psychiatric/Behavioral:       Denies concerns anxiety or depression.       Past Medical History  Diagnosis Date  . GERD (gastroesophageal reflux disease)   . Essential hypertension   . IBS (irritable bowel syndrome)      Social History   Social History  . Marital Status: Legally Separated    Spouse Name: N/A  . Number of Children: N/A  . Years of Education: N/A   Occupational History  . Not on file.   Social History Main Topics  . Smoking status: Never Smoker   . Smokeless tobacco: Not on file  . Alcohol Use: No  . Drug Use: No  . Sexual  Activity: Not on file   Other Topics Concern  . Not on file   Social History Narrative   Separated.    3 children.    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     No past surgical history on file.  Family History  Problem Relation Age of Onset  . Hypertension Mother   . Diabetes Maternal Grandmother   . Hypertension Maternal Grandmother   . Colon cancer Father [id number]  . Depression Mother     Allergies  Allergen Reactions  . Penicillin G Rash    Current Outpatient Prescriptions on File Prior to Visit  Medication Sig Dispense Refill  . hydrochlorothiazide (HYDRODIURIL) 25 MG tablet Take 1 tablet (25 mg total) by mouth daily. 30 tablet 3  . pantoprazole (PROTONIX) 40 MG tablet Take 40 mg by mouth daily.  5   No current facility-administered medications on file prior to visit.    BP 136/86 mmHg  Pulse 75  Temp(Src) 98.1 F (36.7 C) (Oral)  Ht 5\' 2"  (1.575 m)  Wt 210 lb 12.8 oz (95.618 kg)  BMI 38.55 kg/m2  SpO2 98%  LMP 12/22/2015    Objective:   Physical Exam  Constitutional: She is oriented to person, place, and time. She appears well-nourished.  HENT:  Right Ear: Tympanic membrane and ear canal normal.  Left Ear: Tympanic membrane and ear canal normal.  Nose: Nose normal.  Mouth/Throat: Oropharynx is clear and moist.  Eyes: Conjunctivae and EOM are normal. Pupils are equal, round, and reactive  to light.  Neck: Neck supple. No thyromegaly present.  Cardiovascular: Normal rate and regular rhythm.   No murmur heard. Pulmonary/Chest: Effort normal and breath sounds normal. She has no rales.  Abdominal: Soft. Bowel sounds are normal. There is no tenderness.  Musculoskeletal: Normal range of motion.  Lymphadenopathy:    She has no cervical adenopathy.  Neurological: She is alert and oriented to person, place, and time. She has normal reflexes. No cranial nerve deficit.  Skin: Skin is warm and dry. No rash noted.    Psychiatric: She has a normal mood and affect.          Assessment & Plan:

## 2016-01-04 LAB — T4, FREE: Free T4: 0.96 ng/dL (ref 0.60–1.60)

## 2016-01-04 LAB — T3, FREE: T3, Free: 3.2 pg/mL (ref 2.3–4.2)

## 2016-01-10 ENCOUNTER — Encounter: Payer: 59 | Admitting: Primary Care

## 2016-02-29 ENCOUNTER — Encounter: Payer: Self-pay | Admitting: Primary Care

## 2016-03-01 ENCOUNTER — Ambulatory Visit (INDEPENDENT_AMBULATORY_CARE_PROVIDER_SITE_OTHER): Payer: 59 | Admitting: Primary Care

## 2016-03-01 ENCOUNTER — Encounter: Payer: Self-pay | Admitting: Primary Care

## 2016-03-01 VITALS — BP 130/80 | HR 76 | Temp 98.0°F | Ht 62.0 in | Wt 214.1 lb

## 2016-03-01 DIAGNOSIS — R5383 Other fatigue: Secondary | ICD-10-CM | POA: Diagnosis not present

## 2016-03-01 DIAGNOSIS — G4700 Insomnia, unspecified: Secondary | ICD-10-CM | POA: Diagnosis not present

## 2016-03-01 LAB — COMPREHENSIVE METABOLIC PANEL
ALT: 14 U/L (ref 0–35)
AST: 13 U/L (ref 0–37)
Albumin: 3.8 g/dL (ref 3.5–5.2)
Alkaline Phosphatase: 35 U/L — ABNORMAL LOW (ref 39–117)
BUN: 9 mg/dL (ref 6–23)
CO2: 29 mEq/L (ref 19–32)
Calcium: 9 mg/dL (ref 8.4–10.5)
Chloride: 106 mEq/L (ref 96–112)
Creatinine, Ser: 0.9 mg/dL (ref 0.40–1.20)
GFR: 87.24 mL/min (ref >60.00)
Glucose, Bld: 84 mg/dL (ref 70–99)
Potassium: 3.6 mEq/L (ref 3.5–5.1)
Sodium: 139 mEq/L (ref 135–145)
Total Bilirubin: 0.4 mg/dL (ref 0.2–1.2)
Total Protein: 7.2 g/dL (ref 6.0–8.3)

## 2016-03-01 LAB — CBC
HCT: 37.4 % (ref 36.0–46.0)
Hemoglobin: 12.5 g/dL (ref 12.0–15.0)
MCHC: 33.4 g/dL (ref 30.0–36.0)
MCV: 96.4 fl (ref 78.0–100.0)
Platelets: 333 10*3/uL (ref 150.0–400.0)
RBC: 3.88 Mil/uL (ref 3.87–5.11)
RDW: 13.1 % (ref 11.5–15.5)
WBC: 6.7 10*3/uL (ref 4.0–10.5)

## 2016-03-01 LAB — VITAMIN B12: Vitamin B-12: 317 pg/mL (ref 211–911)

## 2016-03-01 LAB — POC URINALSYSI DIPSTICK (AUTOMATED)
Bilirubin, UA: NEGATIVE
Blood, UA: NEGATIVE
Glucose, UA: NEGATIVE
Ketones, UA: NEGATIVE
Leukocytes, UA: NEGATIVE
Nitrite, UA: NEGATIVE
Protein, UA: NEGATIVE
Spec Grav, UA: >=1.03
Urobilinogen, UA: NEGATIVE
pH, UA: 6

## 2016-03-01 LAB — IBC PANEL
Iron: 119 ug/dL (ref 42–145)
Saturation Ratios: 32.3 % (ref 20.0–50.0)
Transferrin: 263 mg/dL (ref 212.0–360.0)

## 2016-03-01 MED ORDER — TRAZODONE HCL 50 MG PO TABS: 25.0000 mg | ORAL_TABLET | Freq: Every evening | ORAL | 1 refills | Status: DC | PRN

## 2016-03-01 NOTE — Progress Notes (Signed)
Pre visit review using our clinic review tool, if applicable. No additional management support is needed unless otherwise documented below in the visit note. 

## 2016-03-01 NOTE — Patient Instructions (Signed)
Complete lab work prior to leaving today. I will notify you of your results once received.   Start Trazodone tablets for insomnia. Take 1/2 to 1 tablet by mouth at bedtime. We are starting you at a low dose so we can always move up.  Please notify me if no improvement in fatigue in 1-2 weeks.  It was a pleasure to see you today!

## 2016-03-01 NOTE — Assessment & Plan Note (Signed)
Present for 1 week, also with some difficulty getting her words out. Neuro exam completely unremarkable. TSH, CBC, electrolytes and may unremarkable. We'll check vitamin B12, CBC, CMP to ensure no changes. Suspect this could be attributed to anxiety that's uncontrolled. Could also be attributed to insomnia. Will treat insomnia for now and have her follow-up if no improvement. At that point we'll consider treatment for anxiety.

## 2016-03-01 NOTE — Progress Notes (Signed)
Subjective:    Patient ID: Kristen [name], female    DOB: Dec 03, 1970, 45 y.o.   MRN: [id number]  HPI  Kristen [name] is a 45 year old female with a history of hypertension who presents today with a chief complaint of fatigue. She also reports blurred vision, dizziness, and difficulty get her words out sometimes.   Her symptoms began 1 week ago. She wakes up in the morning and doesn't feel as though she has rested while sleeping. She goes to bed at 10 pm and will wake up at 6 am. She will wake up during the night to urinate and will fall back asleep within 1 hour. She's tried taking Melatonin which did not help, and cannot tolerate benadryl as it causes her to feel "crazy".   Denies increased anxiety, increased stress, increased work hours. She does snore loudly at night, and her daughter has come out of her own bedroom to shut the door to the patient's room due to the loud snoring. She sleeps alone and is unsure if she's experienced apnea. Denies family history of sleep apnea. Epworth Sleepiness Scale of 12 today.  She's undergone numerous eye exams since December 2016. Her contacts were adjusted 1 month ago, but she continues to struggle with her vision. She denies chronic headaches. She will experience dizziness intermittently that has been ongoing over the last year. She will close her eyes which improves her dizziness.  She would like a CT scan of her head as she may have a brain tumor as she has experienced situations within the last 1 week where she cannot get her words out correctly.  2) Generalized anxiety disorder: Diagnosed years ago and once trialed on Wellbutrin, zoloft, paxil, lexapro. Each of these drugs cause her to feel worse than before so she is currently not managed on any medication. She doesn't believe her anxiety is bad enough to go on daily medication. She was once managed by therapy as well.  Review of Systems  Constitutional: Positive for fatigue. Negative for unexpected  weight change.  Eyes:       Chronic visual disturbance since December 2016  Respiratory: Negative for shortness of breath.   Cardiovascular: Negative for chest pain.  Gastrointestinal: Negative for blood in stool.  Endocrine: Negative for cold intolerance and heat intolerance.  Neurological: Negative for weakness, numbness and headaches.       Intermittent dizziness  Psychiatric/Behavioral: Positive for sleep disturbance. Negative for suicidal ideas. The patient is nervous/anxious.        Past Medical History:  Diagnosis Date  . Essential hypertension   . GERD (gastroesophageal reflux disease)   . IBS (irritable bowel syndrome)      Social History   Social History  . Marital status: Legally Separated    Spouse name: N/A  . Number of children: N/A  . Years of education: N/A   Occupational History  . Not on file.   Social History Main Topics  . Smoking status: Never Smoker  . Smokeless tobacco: Not on file  . Alcohol use No  . Drug use: No  . Sexual activity: Not on file   Other Topics Concern  . Not on file   Social History Narrative   Separated.    3 children.    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     No past surgical history on file.  Family History  Problem Relation Age of Onset  . Hypertension  Mother   . Diabetes Maternal Grandmother   . Hypertension Maternal Grandmother   . Colon cancer Father 13  . Depression Mother     Allergies  Allergen Reactions  . Penicillin G Rash    Current Outpatient Prescriptions on File Prior to Visit  Medication Sig Dispense Refill  . hydrochlorothiazide (HYDRODIURIL) 25 MG tablet Take 1 tablet (25 mg total) by mouth daily. 30 tablet 3  . pantoprazole (PROTONIX) 40 MG tablet Take 40 mg by mouth daily as needed.   5   No current facility-administered medications on file prior to visit.     BP 130/80 (BP Location: Left Arm, Patient Position: Sitting, Cuff Size:  Normal)   Pulse 76   Temp 98 F (36.7 C) (Oral)   Ht 5\' 2"  (1.575 m)   Wt 214 lb 1.9 oz (97.1 kg)   SpO2 98%   BMI 39.16 kg/m    Objective:   Physical Exam  Constitutional: She is oriented to person, place, and time. She appears well-nourished.  Eyes: EOM are normal. Pupils are equal, round, and reactive to light.  Neck: Neck supple.  Cardiovascular: Normal rate and regular rhythm.   Pulmonary/Chest: Effort normal and breath sounds normal.  Neurological: She is alert and oriented to person, place, and time. She has normal reflexes. No cranial nerve deficit.  Skin: Skin is warm and dry.  Psychiatric: She has a normal mood and affect.          Assessment & Plan:

## 2016-03-01 NOTE — Assessment & Plan Note (Addendum)
Would like to try medication for sleep. Given history of anxiety and depression mixed with insomnia, will try low-dose trazodone. Discussed possible side effects and instructions on use. We'll have her start with one half tab to 1 tab 1 hour before bedtime for insomnia. She is to notify me if no improvement. Epworth sleepiness scale of 12 today, will offer sleep study as she is at high risk for sleep apnea.Marland Kitchen

## 2016-05-11 ENCOUNTER — Ambulatory Visit (INDEPENDENT_AMBULATORY_CARE_PROVIDER_SITE_OTHER): Payer: 59 | Admitting: Family Medicine

## 2016-05-11 ENCOUNTER — Encounter: Payer: Self-pay | Admitting: Family Medicine

## 2016-05-11 VITALS — BP 126/90 | HR 74 | Temp 97.7°F | Wt 212.1 lb

## 2016-05-11 DIAGNOSIS — J029 Acute pharyngitis, unspecified: Secondary | ICD-10-CM | POA: Diagnosis not present

## 2016-05-11 DIAGNOSIS — B349 Viral infection, unspecified: Secondary | ICD-10-CM | POA: Diagnosis not present

## 2016-05-11 DIAGNOSIS — K120 Recurrent oral aphthae: Secondary | ICD-10-CM | POA: Diagnosis not present

## 2016-05-11 DIAGNOSIS — J020 Streptococcal pharyngitis: Secondary | ICD-10-CM

## 2016-05-11 LAB — POCT RAPID STREP A (OFFICE): Rapid Strep A Screen: NEGATIVE

## 2016-05-11 NOTE — Progress Notes (Signed)
Subjective:     Patient ID: Kristen [name], female   DOB: 02/16/1971, 45 y.o.   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 She presents today with sore throat x 2 days, feels raspy. Has seasonal allergies and initially thought it was from allergies and draining, but if feels worse this morning. She feels fatigued, not achy. She has a 45 year old without symptoms. Takes Claritin as needed- did not take this morning. Took a dayquil this morning without significant relief. Has been having intermittent tooth pain with known wisdom teeth that need removal. Occasional nonproductive cough. No SOB, no wheeze. No fever. No ear pain. 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   Past Medical History:  Diagnosis Date  . Essential hypertension   . GERD (gastroesophageal reflux disease)   . IBS (irritable bowel syndrome)    No past surgical history on file. Family History  Problem Relation Age of Onset  . Hypertension Mother   . Diabetes Maternal Grandmother   . Hypertension Maternal Grandmother   . Colon cancer Father [id number]  . Depression Mother    Social History  Substance Use Topics  . Smoking status: Never Smoker  . Smokeless tobacco: Not on file  . Alcohol use No     Review of Systems Per HPI    Objective:   Physical Exam  Constitutional: She is oriented to person, place, and time. She appears well-developed and well-nourished. No distress.  HENT:  Head: Normocephalic and atraumatic.  Right Ear: Tympanic membrane, external ear and ear canal normal.  Left Ear: Tympanic membrane, external ear and ear canal normal.  Nose: Mucosal edema present.  Mouth/Throat: Uvula is midline and mucous membranes are normal. Posterior oropharyngeal erythema present. No oropharyngeal exudate, posterior oropharyngeal edema or tonsillar abscesses.  Sounds congested, some hoarseness. Right cheek with small ulcer.   Eyes:  Conjunctivae are normal.  Neck: Normal range of motion. Neck supple.  Cardiovascular: Normal rate, regular rhythm and normal heart sounds.   Pulmonary/Chest: Effort normal and breath sounds normal.  Neurological: She is alert and oriented to person, place, and time.  Skin: Skin is warm and dry. She is not diaphoretic.  Psychiatric: She has a normal mood and affect. Her behavior is normal. Judgment and thought content normal.  Vitals reviewed.     BP 126/90   Pulse 74   Temp 97.7 F (36.5 C)   Wt 212 lb 1.9 oz (96.2 kg)   SpO2 98%   BMI 38.80 kg/m  Wt Readings from Last 3 Encounters:  05/11/16 212 lb 1.9 oz (96.2 kg)  03/01/16 214 lb 1.9 oz (97.1 kg)  01/03/16 210 lb 12.8 oz (95.6 kg)  POCT rapid strep- negative  Assessment:     1. Sore throat - Culture, Group A Strep - POCT rapid strep A  - Provided written and verbal information regarding diagnosis and treatment. - suggested ibuprofen 400-600 mg po q 8-12 hours, warm salt water gargles  2. Viral illness - supportive care including rest, hydration, symptomatic relief measures  3. Aphthous ulcer of mouth - warm salt water gargles, avoid irritants   Olean Reeeborah Gessner, FNP-BC  [facility]  05/11/2016 10:47 AM

## 2016-05-11 NOTE — Patient Instructions (Signed)
Take ibuprofen 2-3 tablets every 8-12 hours as needed for pain/fever  Use salt water gargles as needed  Take claritin daily

## 2016-05-13 LAB — CULTURE, GROUP A STREP

## 2016-05-16 ENCOUNTER — Other Ambulatory Visit: Payer: Self-pay | Admitting: Primary Care

## 2016-05-16 MED ORDER — PANTOPRAZOLE SODIUM 40 MG PO TBEC: 40.0000 mg | DELAYED_RELEASE_TABLET | Freq: Every day | ORAL | 0 refills | Status: DC | PRN

## 2016-05-16 MED ORDER — AMOXICILLIN 500 MG PO CAPS: 500.0000 mg | ORAL_CAPSULE | Freq: Two times a day (BID) | ORAL | 0 refills | Status: DC

## 2016-05-16 MED ORDER — PANTOPRAZOLE SODIUM 40 MG PO TBEC: 40.0000 mg | DELAYED_RELEASE_TABLET | Freq: Every day | ORAL | 1 refills | Status: DC | PRN

## 2016-05-16 NOTE — Addendum Note (Signed)
Addended by: Olean ReeGESSNER, Linsi Humann B on: 05/16/2016 10:02 AM   Modules accepted: Orders

## 2016-05-18 ENCOUNTER — Encounter: Payer: Self-pay | Admitting: Primary Care

## 2016-05-18 ENCOUNTER — Ambulatory Visit (INDEPENDENT_AMBULATORY_CARE_PROVIDER_SITE_OTHER): Payer: 59 | Admitting: Primary Care

## 2016-05-18 VITALS — BP 122/70 | HR 77 | Temp 98.2°F | Ht 62.0 in | Wt 212.4 lb

## 2016-05-18 DIAGNOSIS — G4700 Insomnia, unspecified: Secondary | ICD-10-CM

## 2016-05-18 DIAGNOSIS — R5383 Other fatigue: Secondary | ICD-10-CM | POA: Diagnosis not present

## 2016-05-18 DIAGNOSIS — R221 Localized swelling, mass and lump, neck: Secondary | ICD-10-CM

## 2016-05-18 DIAGNOSIS — H539 Unspecified visual disturbance: Secondary | ICD-10-CM

## 2016-05-18 DIAGNOSIS — I10 Essential (primary) hypertension: Secondary | ICD-10-CM

## 2016-05-18 LAB — MAGNESIUM: Magnesium: 2.1 mg/dL (ref 1.5–2.5)

## 2016-05-18 NOTE — Assessment & Plan Note (Signed)
Stable in the office today, continue HCTZ 25 mg. 

## 2016-05-18 NOTE — Progress Notes (Signed)
Pre visit review using our clinic review tool, if applicable. No additional management support is needed unless otherwise documented below in the visit note. 

## 2016-05-18 NOTE — Assessment & Plan Note (Signed)
Ongoing x 1 year. Full work up per optometrist who recommends neurology eval. Neuro exam today unremarkable.  Referral placed for further evaluation.

## 2016-05-18 NOTE — Assessment & Plan Note (Signed)
Some improvement with Trazodone.

## 2016-05-18 NOTE — Progress Notes (Signed)
Subjective:    Patient ID: Kristen [name], female    DOB: [birth year], 45 y.o.   MRN: [id number]  HPI  Ms. Kristen [name] is a 45 year old female who presents today to discuss multiple concerns.  1) Fatigue: Also with difficulty sleeping, exophthalmos (chronic), voice hoariness, thickness to her tongue, mental cloudiness, slight enlargement to the right side of her neck, and increased PMS symptoms. These symptoms have been ongoing for the past 1 year. She under went treatment in May for thyroid disorder with normal TSH, Free T4, and Free T3. She's not recently undergone evaluation with thyroid ultrasound. Denies palpitations, unexplained weight loss or gain, chest pain, heat/cold intolerance.  2) Visual Changes: Ongoing for the past 1 year. Recommend by her Optometrist to see neurology for further evaluation of symptoms. She's had blurred vision, floaters, can't focus on objects. She's tried numerous different powers for contact lenses without improvement.   Review of Systems  Constitutional: Positive for fatigue.  HENT: Positive for voice change. Negative for sore throat and trouble swallowing.        Enlargement to right anterior neck  Eyes: Positive for visual disturbance. Negative for photophobia and pain.  Respiratory: Negative for shortness of breath.   Cardiovascular: Negative for chest pain and palpitations.  Endocrine: Negative for cold intolerance and heat intolerance.  Neurological: Negative for dizziness and weakness.       Mental cloudiness  Psychiatric/Behavioral: Positive for sleep disturbance.       Past Medical History:  Diagnosis Date  . Essential hypertension   . GERD (gastroesophageal reflux disease)   . IBS (irritable bowel syndrome)      Social History   Social History  . Marital status: Legally Separated    Spouse name: N/A  . Number of children: N/A  . Years of education: N/A   Occupational History  . Not on file.   Social History Main Topics  . Smoking  status: Never Smoker  . Smokeless tobacco: Not on file  . Alcohol use No  . Drug use: No  . Sexual activity: Not on file   Other Topics Concern  . Not on file   Social History Narrative   Separated.    3 children.    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     No past surgical history on file.  Family History  Problem Relation Age of Onset  . Hypertension Mother   . Diabetes Maternal Grandmother   . Hypertension Maternal Grandmother   . Colon cancer Father 251  . Depression Mother     Allergies  Allergen Reactions  . Penicillin G Rash    Current Outpatient Prescriptions on File Prior to Visit  Medication Sig Dispense Refill  . hydrochlorothiazide (HYDRODIURIL) 25 MG tablet Take 1 tablet (25 mg total) by mouth daily. 30 tablet 3  . pantoprazole (PROTONIX) 40 MG tablet Take 1 tablet (40 mg total) by mouth daily as needed. 30 tablet 0  . traZODone (DESYREL) 50 MG tablet Take 0.5-1 tablets (25-50 mg total) by mouth at bedtime as needed for sleep. 30 tablet 1   No current facility-administered medications on file prior to visit.     BP 122/70   Pulse 77   Temp 98.2 F (36.8 C) (Oral)   Ht 5\' 2"  (1.575 m)   Wt 212 lb 6.4 oz (96.3 kg)   LMP 05/10/2016   SpO2 97%   BMI 38.85 kg/m  Objective:   Physical Exam  Constitutional: She is oriented to person, place, and time. She appears well-nourished.  Eyes: EOM are normal. Pupils are equal, round, and reactive to light.  Neck: Neck supple.  Mild enlargement noted to right anterior neck. No palpable nodule to thyroid.  Cardiovascular: Normal rate and regular rhythm.   Pulmonary/Chest: Effort normal and breath sounds normal.  Neurological: She is alert and oriented to person, place, and time. No cranial nerve deficit.  Skin: Skin is warm and dry.  Psychiatric: She has a normal mood and affect.          Assessment & Plan:

## 2016-05-18 NOTE — Patient Instructions (Signed)
Complete lab work prior to leaving today. I will notify you of your results once received.   You will be contacted regarding your referral to Neurology.  Please let us know if you have not heard back within one week.   Stop by the front desk and speak with either Shirlee LimerickMarion or Revonda StandardAllison regarding your Ultrasound. I will be in touch with you once I receive these results.  It was a pleasure to see you today!

## 2016-05-18 NOTE — Assessment & Plan Note (Signed)
Also with hoarse voice, exophthalmos (chronic), mental cloudiness. Exam with mild enlargement to right anterior neck. Obtain thyroid ultrasound and labs including TSI, TPO, Magnesium.

## 2016-05-19 LAB — THYROID PEROXIDASE ANTIBODY: Thyroperoxidase Ab SerPl-aCnc: 1 IU/mL (ref <9)

## 2016-05-23 ENCOUNTER — Ambulatory Visit
Admission: RE | Admit: 2016-05-23 | Discharge: 2016-05-23 | Disposition: A | Discharge status: Home / Self Care | Payer: 59 | Source: Ambulatory Visit | Attending: Primary Care | Admitting: Primary Care

## 2016-05-23 DIAGNOSIS — R5383 Other fatigue: Secondary | ICD-10-CM | POA: Insufficient documentation

## 2016-05-23 DIAGNOSIS — R221 Localized swelling, mass and lump, neck: Secondary | ICD-10-CM | POA: Insufficient documentation

## 2016-05-24 LAB — THYROID STIMULATING IMMUNOGLOBULIN: TSI: <89 % baseline (ref <140)

## 2016-06-07 ENCOUNTER — Ambulatory Visit (INDEPENDENT_AMBULATORY_CARE_PROVIDER_SITE_OTHER): Payer: 59 | Admitting: Neurology

## 2016-06-07 ENCOUNTER — Encounter: Payer: Self-pay | Admitting: Neurology

## 2016-06-07 VITALS — BP 132/85 | HR 72 | Ht 62.0 in | Wt 213.0 lb

## 2016-06-07 DIAGNOSIS — H0520 Unspecified exophthalmos: Secondary | ICD-10-CM | POA: Diagnosis not present

## 2016-06-07 DIAGNOSIS — H16223 Keratoconjunctivitis sicca, not specified as Sjogren's, bilateral: Secondary | ICD-10-CM | POA: Diagnosis not present

## 2016-06-07 DIAGNOSIS — R0683 Snoring: Secondary | ICD-10-CM

## 2016-06-07 DIAGNOSIS — H5713 Ocular pain, bilateral: Secondary | ICD-10-CM

## 2016-06-07 DIAGNOSIS — M3501 Sicca syndrome with keratoconjunctivitis: Secondary | ICD-10-CM

## 2016-06-07 DIAGNOSIS — H538 Other visual disturbances: Secondary | ICD-10-CM

## 2016-06-07 MED ORDER — ALPRAZOLAM 0.5 MG PO TABS: 0.5000 mg | ORAL_TABLET | Freq: Every evening | ORAL | 0 refills | Status: DC | PRN

## 2016-06-07 MED ORDER — CYCLOSPORINE 0.05 % OP EMUL: 1.0000 [drp] | Freq: Two times a day (BID) | OPHTHALMIC | 2 refills | Status: DC

## 2016-06-07 NOTE — Progress Notes (Signed)
Provider:  Melvyn Novasarmen  Korena Nass, M D  Referring Provider: Doreene Nestlark, Katherine K, NP Primary Care Physician:  Morrie Sheldonlark,Katherine Kendal, NP  Chief Complaint  Patient presents with  . Eye Problem    rm 11, New Pt, "slurred speech, dizziness, blurred vision"    HPI:  Kristen [name] is a 45 y.o. female  Is seen here as a referral from Dr. Sharen HonesGutierrez for a neurological evaluation of vision changes.   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 She has noted a decline in visual acuity and especially a decline in her night vision ability. She has frequently seen her optometrist but also has been seen by ophthalmologist Dr. Luciana Axeankin, a retina specialist. (His notes are in Epic)  Dr. Caryn BeeKevin Ritter's notes , ([facility] ),  who saw her almost monthly ,were brought in copy  today .  They span a duration between February 13 through September 22- 6 visits in total. Her contact lenses had no longer prevented her vision from blurring and she found it difficult to focus her gaze. She used a lens called to the air optics aqua. Distant vision on the right eye in February was 2060 and on the left 20/20. She was diagnosed as having presbyopia. I could review all her limbs prescriptions and her optometrist also kept a record of all prescription medicines. She was only on Protonix Claritin at the time of these tests. As she still had trouble with focusing of her gaze and vision she returned in March her distant vision felt okay remarked in her chart was that the lens strength was approximately the same as before yet her visual abilities continued to decline. By April the patient had an additional medication prescribed hydrochlorothiazide. For the treatment of hypertension. A full examination on 11/19/2015 showed normal fundi, normal clear macula, and a posterior vitreous detachment on the left. She was still searching for more tolerable lens. By May, eyedrops were  added XIIDRA .  The patient continued to be blurred him a unclear and not sharp. She felt by September that her lenses had become intolerable. She had floaters, she reported poor night vision, redness, and headaches. The eyedrops prescribed were meant to alleviate eye dryness. Her last attempt at a contact lens was the Biofinity-Multifocal which she is wearing today. Her concern is that the eyedrops  workwell but are too expansive, unaffordable to her. She received the current bottle with a coupon- but once this coupon runs out the medication will cost her $200 or more.  She still experiences discomfort with using these lenses, irritation burning plus she is unhappy with her current visual acuity. She was able to read large print at about 8 yards distance but reported that the letters were not sharp at the margin, she also thought the print was black but it is purple.  Review of Systems: Out of a complete 14 system review, the patient complains of only the following symptoms, and all other reviewed systems are negative.  headaches, none now.  The headaches were most often appearing at the end of a days work when she had worn her contact lenses for many many hours, she associated nausea with her headaches, photophobia, and this seems to be a migraine component. Dry eye . Floaters in both eyes, started in the left/  Social History   Social History  . Marital status: Legally Separated    Spouse name: N/A  . Number of children:  3  . Years of education: 84   Occupational History  .      [employer] [place], [employer]   Social History Main Topics  . Smoking status: Never Smoker  . Smokeless tobacco: Never Used  . Alcohol use No     Comment: rare  . Drug use: No  . Sexual activity: Not on file   Other Topics Concern  . Not on file   Social History Narrative   Separated.    3 children.    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      Family History  Problem Relation Age of Onset  . Hypertension Mother   . Depression Mother   . Diabetes Maternal Grandmother   . Hypertension Maternal Grandmother   . Colon cancer Father 4    Past Medical History:  Diagnosis Date  . Essential hypertension   . GERD (gastroesophageal reflux disease)   . IBS (irritable bowel syndrome)     Past Surgical History:  Procedure Laterality Date  . ADENOIDECTOMY     lymph nodes removed  . TONSILLECTOMY    . TUBAL LIGATION      Current Outpatient Prescriptions  Medication Sig Dispense Refill  . hydrochlorothiazide (HYDRODIURIL) 25 MG tablet Take 1 tablet (25 mg total) by mouth daily. 30 tablet 3  . pantoprazole (PROTONIX) 40 MG tablet Take 1 tablet (40 mg total) by mouth daily as needed. 30 tablet 0  . traZODone (DESYREL) 50 MG tablet Take 0.5-1 tablets (25-50 mg total) by mouth at bedtime as needed for sleep. 30 tablet 1   No current facility-administered medications for this visit.     Allergies as of 06/07/2016 - Review Complete 06/07/2016  Allergen Reaction Noted  . Amoxicillin Nausea Only 06/07/2016  . Penicillin g Rash 11/11/2015    Vitals: BP 132/85   Pulse 72   Ht 5\' 2"  (1.575 m)   Wt 213 lb (96.6 kg)   LMP 05/10/2016   BMI 38.96 kg/m  Last Weight:  Wt Readings from Last 1 Encounters:  06/07/16 213 lb (96.6 kg)   Last Height:   Ht Readings from Last 1 Encounters:  06/07/16 5\' 2"  (1.575 m)    Physical exam:  General: The patient is awake, alert and appears not in acute distress. The patient is well groomed. Head: Normocephalic, atraumatic. Neck is supple.  Cardiovascular:  Regular rate and rhythm , without  murmurs or carotid bruit, and without distended neck veins. Respiratory: Lungs are clear to auscultation. Skin:  Without evidence of edema, or rash Trunk: BMI is elevated and patient  has normal posture.  Neurologic exam : The patient is awake and alert, oriented to place and time.  Memory  subjective described as intact.  There is a normal attention span & concentration ability.  Speech is fluent without dysarthria, dysphonia or aphasia. Mood and affect are appropriate.  Cranial nerves: Pupils are equal and briskly reactive to light.  She has occasionally a eye lid tic.  Funduscopic exam without evidence of pallor or edema.  Her pupils constrict to accomodation, she has protruding eyes, no eye pain.  Extraocular movements  in vertical and horizontal planes intact and without nystagmus.  Visual fields by finger perimetry are intact. Hearing to finger rub intact.  Facial sensation intact to fine touch. Facial motor strength is symmetric and tongue and uvula move midline. Tongue protrusion into either cheek is normal. Shoulder shrug is normal.  Motor exam:  Normal tone ,muscle bulk and symmetric strength in all extremities.  Sensory:  Fine touch, pinprick and vibration were tested in all extremities. Proprioception was normal.  Coordination: Rapid alternating movements in the fingers/hands were normal. Finger-to-nose maneuver  normal without evidence of ataxia, dysmetria or tremor.  Gait and station: Patient walks without assistive device and is able unassisted to climb up to the exam table.  Deep tendon reflexes: in the  upper and lower extremities are symmetric and intact..   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 This progression has been documented over the last 6 months with monthly optometrist visits. She has tried multiple contact lenses and still feels that she has not found a good fit, she has blurred vision, unsharp margins when reading, and today here at the office even some trouble with identifying color.  Dr. Luciana Axeankin a retina specialist ophthalmologist had seen the patient  this summer and had mentioned to her that he is concerned about possible Graves' disease. In response to this she has been tested by her primary care provider but all thyroid checks have returned negative.  She also has a history of migraine clearly associated headaches with nausea and photophobia but these have not been present as her vision declines they have not let to diplopia or any visual aura that she can identify. Her loss of visual acuity is unrelated to migraines.  She is fluid extraocular movements, intact accommodation, but clearly visible exophthalmus.  Plan:  Treatment plan and additional workup :  I will order an MRI of the brain, with and without contrast. My goal is to document any retro-orbital pressure, fluid accumulation, or demyelination. For this reason contrast was need to be added. As to the dry eye, they have just occurred over the last 6 months. We should check for rheumatological conditions such as sicca syndrome, she reported also some dry throat difficulties with swallowing, and the feeling of an enlarged neck. Thyroid scan was negative.   I hope that the patient can change to a cheaper dry eye medication, such as Restasis. I will ask for her to return for a revisit in 4 weeks after the MRI is done.  I would much prefer for for for her to change from contact lenses to corrective  glasses as not to have  contact irritation    Melvyn Novasarmen Hashim Eichhorst MD 06/07/2016

## 2016-06-07 NOTE — Addendum Note (Signed)
Addended by: Melvyn NovasHMEIER, Harvy Riera on: 06/07/2016 05:11 PM   Modules accepted: Orders

## 2016-06-07 NOTE — Patient Instructions (Signed)
Keratoconjunctivitis Sicca Keratoconjunctivitis sicca (dry eye) is dryness of the membranes surrounding the eye. It is caused by inadequate production of natural tears. The eyes must remain lubricated at all times. This creates a smooth surface of the cornea (clear covering at the front of the eye), which allows the eyelids to slide over the eye without causing soreness and irritation. A small amount of tears are constantly produced by the tear glands (lacrimal glands). These glands are located under the outside part of the upper eyelids. Dry eyes are often caused by decreased tear production. This condition is called aqueous tear-deficient dry eyes. The tear glands do not produce enough tears to keep the tissues surrounding the eye moist. This condition is common in postmenopausal women. Dry eyes may also be caused by an abnormality of how the tears are made. This can result in faster evaporation of the tears. It is called evaporative dry eyes. Although the tear glands produce enough tears, the rate of evaporation is so fast that the entire eye surface cannot be kept covered with a complete layer of tears. The condition can occur during certain activities or in unusually dry surroundings.  Sometimes dry eyes are symptoms of other diseases that can affect the eyes. Some examples are:   Rheumatoid arthritis.  Systemic lupus erythematosus (lupus). Chronic inflammatory disease, causing the body to attack its own tissue.  Sjogren syndrome. Dryness of the eyes and mouth, sometimes associated with a form of rheumatoid arthritis or lupus. SYMPTOMS  Symptoms of dry eyes include:  Irritation.  Itching.  Redness.  Burning and feeling as though there is something gritty in the eye. If the surface of the eye becomes damaged, sensitivity may increase, along with increased discomfort and sensitivity to bright light. Symptoms are made worse by activities with decreased blinking, such as staring at a  television, book, or computer screen.  Symptoms are also worse in dusty or smoky areas and in dry environments. Certain drugs can make symptoms worse, including antihistamines, tranquilizers, diuretics, antihypertensives (blood pressure medicine), and oral contraceptives. Symptoms tend to improve during cool, rainy, or foggy weather and in humid places, such as in the shower. DIAGNOSIS  Dry eyes are usually diagnosed by symptoms alone. Sometimes a Schirmer test is done, in which a strip of filter paper is placed at the edge of the eyelid. The amount of moisture bathing the eye is measured. This test is done by measuring how far the tears go up a strip of paper applied to the eye in a set amount of time. Your caregiver will use a special microscope to examine the surface of the eye and the cornea to see if there are signs of dryness. TREATMENT  Artificial tears (eye drops made with substances that simulate real tears) applied every few hours can generally control the problem. Lubricating ointments may help more severe cases. Avoiding dry, drafty environments and using humidifiers may also help. Minor surgery can be done to block the flow of tears into the nose, so that more tears are available to bathe the eyes. Patients with evaporative dry eyes may also benefit from treatment of the inflammation (redness and soreness) of the eyelids, which often occurs with the dry eyes. This treatment may include warm compresses, eyelid margin scrubs, or oral medicines. PROGNOSIS  Even with severe dry eyes, it is uncommon to lose vision. At times, it may become difficult to see. Rarely, scarring and ulcers may occur, and blood vessels can grow across the cornea. Scarring and blood  vessel growth can impair vision. In severe cases, the cornea may become damaged or infected. Ulcers or infections of the cornea are serious complications. PREVENTION  There is no way to prevent getting keratoconjunctivitis sicca. However,  complications can be prevented by keeping the eyes as moist as possible with artificial drops, as needed. SEEK IMMEDIATE MEDICAL CARE IF:   Your eye pain gets worse.  You develop a pus-like drainage from the eye.  The eye drops or medicines prescribed by your caregiver are not helping.  You have dry eyes and a sudden increase in discomfort or redness.  You have a sudden decrease in vision.  You have any other questions or concerns.   This information is not intended to replace advice given to you by your health care provider. Make sure you discuss any questions you have with your health care provider.   Document Released: 06/09/2004 Document Revised: 08/13/2014 Document Reviewed: 06/13/2009 Elsevier Interactive Patient Education 2016 Elsevier Inc.  

## 2016-06-08 ENCOUNTER — Other Ambulatory Visit (INDEPENDENT_AMBULATORY_CARE_PROVIDER_SITE_OTHER): Payer: 59

## 2016-06-08 DIAGNOSIS — H538 Other visual disturbances: Secondary | ICD-10-CM | POA: Diagnosis not present

## 2016-06-08 DIAGNOSIS — M3501 Sicca syndrome with keratoconjunctivitis: Secondary | ICD-10-CM

## 2016-06-08 DIAGNOSIS — H0520 Unspecified exophthalmos: Secondary | ICD-10-CM

## 2016-06-08 DIAGNOSIS — H5713 Ocular pain, bilateral: Secondary | ICD-10-CM

## 2016-06-08 DIAGNOSIS — H16223 Keratoconjunctivitis sicca, not specified as Sjogren's, bilateral: Secondary | ICD-10-CM

## 2016-06-09 LAB — COMPREHENSIVE METABOLIC PANEL
ALT: 15 IU/L (ref 0–32)
AST: 15 IU/L (ref 0–40)
Albumin/Globulin Ratio: 1.4 (ref 1.2–2.2)
Albumin: 4.1 g/dL (ref 3.5–5.5)
Alkaline Phosphatase: 39 IU/L (ref 39–117)
BUN/Creatinine Ratio: 7 — ABNORMAL LOW (ref 9–23)
BUN: 6 mg/dL (ref 6–24)
Bilirubin Total: 0.3 mg/dL (ref 0.0–1.2)
CO2: 23 mmol/L (ref 18–29)
Calcium: 9.4 mg/dL (ref 8.7–10.2)
Chloride: 100 mmol/L (ref 96–106)
Creatinine, Ser: 0.81 mg/dL (ref 0.57–1.00)
GFR calc Af Amer: 102 mL/min/{1.73_m2} (ref >59)
GFR calc non Af Amer: 89 mL/min/{1.73_m2} (ref >59)
Globulin, Total: 3 g/dL (ref 1.5–4.5)
Glucose: 79 mg/dL (ref 65–99)
Potassium: 4.2 mmol/L (ref 3.5–5.2)
Sodium: 138 mmol/L (ref 134–144)
Total Protein: 7.1 g/dL (ref 6.0–8.5)

## 2016-06-09 LAB — SJOGREN'S SYNDROME ANTIBODS(SSA + SSB)
ENA SSA (RO) Ab: <0.2 AI (ref 0.0–0.9)
ENA SSB (LA) Ab: <0.2 AI (ref 0.0–0.9)

## 2016-06-09 LAB — ANA W/REFLEX IF POSITIVE: Anti Nuclear Antibody(ANA): NEGATIVE

## 2016-06-12 ENCOUNTER — Telehealth: Payer: Self-pay

## 2016-06-12 NOTE — Telephone Encounter (Signed)
-----   Message from Melvyn Novasarmen Dohmeier, MD sent at 06/11/2016  1:27 PM EST ----- Autoimmune test for Sicca negative , normal metabolic function.  Low creatinine is not abnormal, but desirable.CD

## 2016-06-12 NOTE — Telephone Encounter (Signed)
I called pt and left a detailed message (per DPR) that her labs were normal. I asked pt to call me back with further questions or concerns.

## 2016-06-20 ENCOUNTER — Ambulatory Visit (INDEPENDENT_AMBULATORY_CARE_PROVIDER_SITE_OTHER): Payer: 59

## 2016-06-20 ENCOUNTER — Telehealth: Payer: Self-pay | Admitting: Neurology

## 2016-06-20 DIAGNOSIS — H0520 Unspecified exophthalmos: Secondary | ICD-10-CM | POA: Diagnosis not present

## 2016-06-20 DIAGNOSIS — M3501 Sicca syndrome with keratoconjunctivitis: Secondary | ICD-10-CM

## 2016-06-20 DIAGNOSIS — R0683 Snoring: Secondary | ICD-10-CM | POA: Diagnosis not present

## 2016-06-20 DIAGNOSIS — H5713 Ocular pain, bilateral: Secondary | ICD-10-CM | POA: Diagnosis not present

## 2016-06-20 DIAGNOSIS — H538 Other visual disturbances: Secondary | ICD-10-CM | POA: Diagnosis not present

## 2016-06-20 DIAGNOSIS — H16223 Keratoconjunctivitis sicca, not specified as Sjogren's, bilateral: Secondary | ICD-10-CM | POA: Diagnosis not present

## 2016-06-20 MED ORDER — CYCLOSPORINE 0.05 % OP EMUL: 1.0000 [drp] | Freq: Two times a day (BID) | OPHTHALMIC | 2 refills | Status: DC

## 2016-06-20 NOTE — Telephone Encounter (Signed)
Changed to multidose.

## 2016-06-20 NOTE — Telephone Encounter (Signed)
PLEASE CHANGE THE SCRIPT TO RESTASIS MULTIDOSE 90 DAY SCRIPT SO THAT SHE CAN USE A COUPON FOR IT.

## 2016-06-21 MED ORDER — GADOPENTETATE DIMEGLUMINE 469.01 MG/ML IV SOLN: 20.0000 mL | Freq: Once | INTRAVENOUS | Status: DC | PRN

## 2016-06-21 NOTE — Telephone Encounter (Signed)
[facility] [place]. Received a receipt of confirmation.

## 2016-06-22 ENCOUNTER — Encounter: Payer: Self-pay | Admitting: Neurology

## 2016-06-22 ENCOUNTER — Telehealth: Payer: Self-pay | Admitting: Neurology

## 2016-06-22 DIAGNOSIS — G932 Benign intracranial hypertension: Secondary | ICD-10-CM | POA: Insufficient documentation

## 2016-06-22 MED ORDER — ACETAZOLAMIDE 250 MG PO TABS: ORAL_TABLET | ORAL | 5 refills | Status: DC

## 2016-06-22 NOTE — Telephone Encounter (Signed)
Patient was called and told about the possible intracranial fluid pressure elevation.  We decided not to do a spinal tap as it is very difficult to get done before the holidays, but start on Topiramate or diamox. Decided on Diamox 250 mg in am and pm. By mouth. [facility].

## 2016-06-25 ENCOUNTER — Other Ambulatory Visit: Payer: Self-pay

## 2016-06-25 ENCOUNTER — Encounter: Payer: Self-pay | Admitting: Neurology

## 2016-06-25 MED ORDER — CYCLOSPORINE 0.05 % OP EMUL: 1.0000 [drp] | Freq: Two times a day (BID) | OPHTHALMIC | 1 refills | Status: DC

## 2016-06-25 NOTE — Telephone Encounter (Signed)
RX for restasis faxed to [facility] in [place]. Received a receipt of confirmation.  

## 2016-06-26 ENCOUNTER — Other Ambulatory Visit: Payer: Self-pay

## 2016-06-26 ENCOUNTER — Telehealth: Payer: Self-pay

## 2016-06-26 DIAGNOSIS — H538 Other visual disturbances: Secondary | ICD-10-CM

## 2016-06-26 MED ORDER — TOPIRAMATE 25 MG PO TABS: 25.0000 mg | ORAL_TABLET | Freq: Two times a day (BID) | ORAL | 3 refills | Status: DC

## 2016-06-26 NOTE — Telephone Encounter (Signed)
Received a pa request from pt's [facility] pharmacy asking for a pa for restasis. I called [facility]. I was advised that a 30 day supply of restasis is on pt's formulary. A 90 day supply is not on formulary and would require a PA.  I have completed a PA for a 90 day supply via covermymeds. Sent to [facility]. Should have a determination in 1-3 business days.

## 2016-06-26 NOTE — Telephone Encounter (Signed)
Diamox and topiramate both block and enzyme that produces fluid in the brain, they have only mild diuretic effect. A diuretic is not a primary treatment for benign intracranial hypertension.CD

## 2016-06-26 NOTE — Telephone Encounter (Signed)
I am absolutely willing to try Topiramate, but I usually don't add lasix right away. Some patients do well with monotherapy on topiramate.  I will prescribe 25 mg topiramate for bid use. Side effects can be : tingling , word finding delay and it can render hormonal contraceptives less effective .   I wrote for 60  Tab of TPM 25 mg, bid with 2 refills.

## 2016-06-27 NOTE — Telephone Encounter (Signed)
I received notification from [facility] that the pa for restasis is not needed because it is on the list of covered drugs. I advised pharmacy of this and asked them to try a 90 day supply (this is the pa I completed) but if the 90 day supply is not approved still, to please dispense a 30 day supply, which is covered for certain.

## 2016-07-11 ENCOUNTER — Encounter: Payer: Self-pay | Admitting: Neurology

## 2016-07-11 ENCOUNTER — Ambulatory Visit (INDEPENDENT_AMBULATORY_CARE_PROVIDER_SITE_OTHER): Payer: 59 | Admitting: Neurology

## 2016-07-11 VITALS — BP 118/82 | HR 70 | Resp 18 | Ht 62.0 in | Wt 210.0 lb

## 2016-07-11 DIAGNOSIS — R9089 Other abnormal findings on diagnostic imaging of central nervous system: Secondary | ICD-10-CM

## 2016-07-11 DIAGNOSIS — G932 Benign intracranial hypertension: Secondary | ICD-10-CM

## 2016-07-11 DIAGNOSIS — R0683 Snoring: Secondary | ICD-10-CM

## 2016-07-11 DIAGNOSIS — R51 Headache: Secondary | ICD-10-CM

## 2016-07-11 DIAGNOSIS — R519 Headache, unspecified: Secondary | ICD-10-CM

## 2016-07-11 NOTE — Progress Notes (Signed)
Provider:  Melvyn Novasarmen  Aryonna Gunnerson, M D  Referring Provider: Doreene Nestlark, Katherine K, NP Primary Care Physician:  Morrie Sheldonlark,Katherine Kendal, NP  Chief Complaint  Patient presents with  . Follow-up    Rm 10. Patient states that she stopped the Diamox, reports it gave her headaches and caused dizziness. Also not currently taking Xanax and Topamax.     HPI:  Kristen [name] is a 45 y.o. female  Is seen here as a referral from Dr. Sharen HonesGutierrez for a neurological evaluation of vision changes.   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 She has noted a decline in visual acuity and especially a decline in her night vision ability. She has frequently seen her optometrist but also has been seen by ophthalmologist Dr. Luciana Axeankin, a retina specialist. (His notes are in Epic)  Dr. Caryn BeeKevin Ritter's notes , ([facility] ),  who saw her almost monthly ,were brought in copy  today .  They span a duration between February 13 through September 22- 6 visits in total. Her contact lenses had no longer prevented her vision from blurring and she found it difficult to focus her gaze. She used a lens called to the air optics aqua. Distant vision on the right eye in February was 2060 and on the left 20/20. She was diagnosed as having presbyopia. I could review all her limbs prescriptions and her optometrist also kept a record of all prescription medicines. She was only on Protonix Claritin at the time of these tests. As she still had trouble with focusing of her gaze and vision she returned in March her distant vision felt okay remarked in her chart was that the lens strength was approximately the same as before yet her visual abilities continued to decline. By April the patient had an additional medication prescribed hydrochlorothiazide. For the treatment of hypertension. A full examination on 11/19/2015 showed normal fundi, normal clear macula, and a posterior vitreous  detachment on the left. She was still searching for more tolerable lens. By May, eyedrops were added XIIDRA .  The patient continued to be blurred him a unclear and not sharp. She felt by September that her lenses had become intolerable. She had floaters, she reported poor night vision, redness, and headaches. The eyedrops prescribed were meant to alleviate eye dryness. Her last attempt at a contact lens was the Biofinity-Multifocal which she is wearing today. Her concern is that the eyedrops  workwell but are too expansive, unaffordable to her. She received the current bottle with a coupon- but once this coupon runs out the medication will cost her $200 or more.  She still experiences discomfort with using these lenses, irritation burning plus she is unhappy with her current visual acuity. She was able to read large print at about 8 yards distance but reported that the letters were not sharp at the margin, she also thought the print was black but it is purple.  Interval history on 07/11/2016, I follow up with [name] today on her recent neuroimaging report. She underwent an MRI brain with and without contrast on November 16, interpreted by my colleague Dr. Luciano CutterVik Penumalli. It showed mild enlargement of the bilateral optic nerve sheath and a flattened pituitary gland on the floor of the sella. These findings can be seen in the setting of an idiopathic intracranial hypertension. For this reason the patient will be treated with fluid reducing medications. Her first medication was Diamox, which led her  to be severely lethargic and unwell. After that she started on Topiramate, developed joint pain, dizziness and diarrhea.  There now meeting to see if it is necessary to do a spinal tap or not. Since her MRI shows changes that are consistent with idiopathic hypertension and she has clinical symptoms of blurred vision and retro-orbital pain, there may be other explanations for these conditions. And since she has  gotten sicker on medication than without medication I feel that we need to establish the diagnosis beyond doubt.  Review of Systems: Out of a complete 14 system review, the patient complains of only the following symptoms, and all other reviewed systems are negative.  headaches, none now.  The headaches were most often appearing at the end of a days work when she had worn her contact lenses for many many hours, she associated nausea with her headaches, photophobia, and this seems to be a migraine component. Dry eye . Floaters in both eyes, started in the left/  Social History   Social History  . Marital status: Legally Separated    Spouse name: N/A  . Number of children: 3  . Years of education: 7618   Occupational History  .      [employer]   Social History Main Topics  . Smoking status: Never Smoker  . Smokeless tobacco: Never Used  . Alcohol use No     Comment: rare  . Drug use: No  . Sexual activity: Not on file   Other Topics Concern  . Not on file   Social History Narrative   Separated.    3 children.    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     Family History  Problem Relation Age of Onset  . Hypertension Mother   . Depression Mother   . Diabetes Maternal Grandmother   . Hypertension Maternal Grandmother   . Colon cancer Father [id number]    Past Medical History:  Diagnosis Date  . Essential hypertension   . GERD (gastroesophageal reflux disease)   . IBS (irritable bowel syndrome)     Past Surgical History:  Procedure Laterality Date  . ADENOIDECTOMY     lymph nodes removed  . TONSILLECTOMY    . TUBAL LIGATION      Current Outpatient Prescriptions  Medication Sig Dispense Refill  . cycloSPORINE (RESTASIS MULTIDOSE) 0.05 % ophthalmic emulsion Place 1 drop into both eyes 2 (two) times daily. 90 days 18 mL 1  . hydrochlorothiazide (HYDRODIURIL) 25 MG tablet Take 1 tablet (25 mg total) by  mouth daily. 30 tablet 3  . pantoprazole (PROTONIX) 40 MG tablet Take 1 tablet (40 mg total) by mouth daily as needed. 30 tablet 0  . acetaZOLAMIDE (DIAMOX) 250 MG tablet Take one in AM and one in late afternoon by mouth. (Patient not taking: Reported on 07/11/2016) 60 tablet 5  . ALPRAZolam (XANAX) 0.5 MG tablet Take 1 tablet (0.5 mg total) by mouth at bedtime as needed for anxiety. (Patient not taking: Reported on 07/11/2016) 30 tablet 0  . topiramate (TOPAMAX) 25 MG tablet Take 1 tablet (25 mg total) by mouth 2 (two) times daily. (Patient not taking: Reported on 07/11/2016) 60 tablet 3   No current facility-administered medications for this visit.    Facility-Administered Medications Ordered in Other Visits  Medication Dose Route Frequency Provider Last Rate Last Dose  . gadopentetate dimeglumine (MAGNEVIST) injection 20 mL  20 mL Intravenous Once  PRN Melvyn Novas, MD        Allergies as of 07/11/2016 - Review Complete 07/11/2016  Allergen Reaction Noted  . Amoxicillin Nausea Only 06/07/2016  . Penicillin g Rash 11/11/2015    Vitals: BP 118/82   Pulse 70   Resp 18   Ht 5\' 2"  (1.575 m)   Wt 210 lb (95.3 kg)   BMI 38.41 kg/m  Last Weight:  Wt Readings from Last 1 Encounters:  07/11/16 210 lb (95.3 kg)   Last Height:   Ht Readings from Last 1 Encounters:  07/11/16 5\' 2"  (1.575 m)    Physical exam:  General: The patient is awake, alert and appears not in acute distress. The patient is well groomed. Head: Normocephalic, atraumatic. Neck is supple.  Cardiovascular:  Regular rate and rhythm , without  murmurs or carotid bruit, and without distended neck veins. Respiratory: Lungs are clear to auscultation. Skin:  Without evidence of edema, or rash Trunk: BMI is elevated and patient  has normal posture.  Neurologic exam : The patient is awake and alert, oriented to place and time.  Memory subjective described as intact.  There is a normal attention span & concentration  ability.  Speech is fluent without dysarthria, dysphonia or aphasia. Mood and affect are appropriate.  Cranial nerves: Pupils are equal and briskly reactive to light.  She has occasionally a eye lid tic.  Funduscopic exam without evidence of pallor or edema.  Her pupils constrict to accomodation, she has protruding eyes, no eye pain. Since she has started on topiramate she developed a tic and twitch in the left eyelid. There is no ptosis, but proptosis. ( Dr. Barbaraann Barthel note ) Extraocular movements  in vertical and horizontal planes intact and without nystagmus.  Visual fields by finger perimetry are intact. Hearing to finger rub intact.  Facial sensation intact to fine touch. Facial motor strength is symmetric and tongue and uvula move midline. Tongue protrusion into either cheek is normal. Shoulder shrug is normal.   Motor exam:  Normal tone ,muscle bulk and symmetric strength in all extremities. Sensory:  Reports tingling numbness in fingers and around the mouth during Diamox treatment, this ceased under topiramate and has not returned.  Coordination: Rapid alternating movements in the fingers/hands were normal. Finger-to-nose maneuver  normal without evidence of ataxia, dysmetria or tremor.  Gait and station: Patient walks without assistive device and is able unassisted to climb up to the exam table.  Deep tendon reflexes: in the  upper and lower extremities are symmetric and intact..   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 This progression has been documented over the last 6 months with monthly optometrist visits. She has tried multiple contact lenses and still feels that she has not found a good fit, she has blurred vision, unsharp margins when reading, and today here at the office even some trouble  with identifying color.  Dr. Luciana Axe a retina specialist ophthalmologist had seen the patient this summer and had mentioned to her that he is concerned about possible Graves' disease. In response to this she has been tested by her primary care provider but all thyroid checks have returned negative.  She also has a history of migraine clearly associated headaches with nausea and photophobia but these have not been present as her vision declines they have not let to diplopia or  any visual aura that she can identify. Her loss of visual acuity is unrelated to migraines.  She is fluid extraocular movements, intact accommodation, but clearly visible exophthalmus.  Plan:  Treatment plan and additional workup :  Mrs. 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 She did not tolerate Diamox nor topiramate,  Both carboanhydrase inhibitors.  I will order a lumbar puncture to evaluate for opening pressure. She will not take topiramate or Diamox for the time being. She stopped taking topiramate about a week ago. I'm optimistic that her  pressure would be increasing again after 14 days of medication.  Will send fluid for oligoclonal, CBC diff, protein and glucose.  Opening pressure to be documented by interventional radiologist, who will perform the lumbar puncture.  Split night polysomnography ordered, as patient suffers from sleep related headaches, morning headaches, and untreated sleep apnea is a risk factor in developing pseudotumor cerebri. CO2 retention should be measured. Phone follow up with results.   Porfirio Mylar Rojean Ige MD 07/11/2016

## 2016-07-11 NOTE — Patient Instructions (Signed)
Lumbar Puncture, Care After °Refer to this sheet in the next few weeks. These instructions provide you with information on caring for yourself after your procedure. Your health care provider may also give you more specific instructions. Your treatment has been planned according to current medical practices, but problems sometimes occur. Call your health care provider if you have any problems or questions after your procedure. °What can I expect after the procedure? °After your procedure, it is typical to have the following sensations: °· Mild discomfort or pain at the insertion site. °· Mild headache that is relieved with pain medicines. ° °Follow these instructions at home: ° °· Avoid lifting anything heavier than 10 lb (4.5 kg) for at least 12 hours after the procedure. °· Drink enough fluids to keep your urine clear or pale yellow. °Contact a health care provider if: °· You have fever or chills. °· You have nausea or vomiting. °· You have a headache that lasts for more than 2 days. °Get help right away if: °· You have any numbness or tingling in your legs. °· You are unable to control your bowel or bladder. °· You have bleeding or swelling in your back at the insertion site. °· You are dizzy or faint. °This information is not intended to replace advice given to you by your health care provider. Make sure you discuss any questions you have with your health care provider. °Document Released: 07/28/2013 Document Revised: 12/29/2015 Document Reviewed: 03/31/2013 °Elsevier Interactive Patient Education © 2017 Elsevier Inc. ° °

## 2016-07-17 ENCOUNTER — Telehealth: Payer: Self-pay | Admitting: Neurology

## 2016-07-17 NOTE — Telephone Encounter (Signed)
Kim/GI [phone] called to advise [facility] tomorrow morning at 7:30 there is a discrepancy with CBC w/diff. She advised they close at 4pm. Please call asap!

## 2016-07-17 NOTE — Telephone Encounter (Signed)
I called back and clarified orders

## 2016-07-18 ENCOUNTER — Ambulatory Visit
Admission: RE | Admit: 2016-07-18 | Discharge: 2016-07-18 | Disposition: A | Discharge status: Home / Self Care | Payer: 59 | Source: Ambulatory Visit | Attending: Neurology | Admitting: Neurology

## 2016-07-18 VITALS — BP 135/86 | HR 69

## 2016-07-18 DIAGNOSIS — H539 Unspecified visual disturbance: Secondary | ICD-10-CM

## 2016-07-18 DIAGNOSIS — R0683 Snoring: Secondary | ICD-10-CM

## 2016-07-18 DIAGNOSIS — G932 Benign intracranial hypertension: Secondary | ICD-10-CM | POA: Diagnosis not present

## 2016-07-18 LAB — GLUCOSE, CSF: Glucose, CSF: 68 mg/dL (ref 43–76)

## 2016-07-18 LAB — CSF CELL COUNT WITH DIFFERENTIAL
RBC Count, CSF: 1 cells/uL (ref 0–10)
WBC, CSF: 0 cells/uL (ref 0–5)

## 2016-07-18 LAB — PROTEIN, CSF: Total Protein, CSF: 17 mg/dL (ref 15–45)

## 2016-07-18 NOTE — Progress Notes (Signed)
1 SST tube drawn from right AC to go with spinal fluid. Site is unremarkable and pt tolerated procedure well. Discharge instructions explained to pt. 

## 2016-07-18 NOTE — Discharge Instructions (Signed)

## 2016-07-19 ENCOUNTER — Telehealth: Payer: Self-pay

## 2016-07-19 NOTE — Telephone Encounter (Signed)
-----   Message from Carmen Dohmeier, MD sent at 07/18/2016 12:25 PM EST ----- 16 cm water opening pressure is normal. Not  consistent with pseudotumor cerebri.  No diamox should be used, the fluid  Has not built up.     

## 2016-07-19 NOTE — Telephone Encounter (Signed)
-----   Message from Melvyn Novasarmen Dohmeier, MD sent at 07/18/2016 12:25 PM EST ----- 16 cm water opening pressure is normal. Not  consistent with pseudotumor cerebri.  No diamox should be used, the fluid  Has not built up.

## 2016-07-19 NOTE — Telephone Encounter (Signed)
I called to discuss CSF results and LP. (Per Dr. Vickey Hugerohmeier, "Normal CSF sample, no MS and no infection, no inflammation.")  No answer, left a message asking pt to call me back.

## 2016-07-23 ENCOUNTER — Telehealth: Payer: Self-pay | Admitting: Neurology

## 2016-07-23 DIAGNOSIS — H538 Other visual disturbances: Secondary | ICD-10-CM

## 2016-07-23 LAB — OLIGOCLONAL BANDS, CSF + SERM

## 2016-07-23 NOTE — Telephone Encounter (Signed)
I spoke to patient and she is aware of results. Patient states that she is NOT taking Topamax and Diamox. She is requesting again to stop her HCTZ and take a stronger diuretic like Lasix. Patient asks if you can call her back and discuss further.

## 2016-07-23 NOTE — Telephone Encounter (Signed)
Dr. Vickey Hugerohmeier has called the patient and discussed results further. Also placed referral to Neuro optho. Patient has sleep study scheduled and will return for appt after.

## 2016-07-23 NOTE — Telephone Encounter (Signed)
I should have said topamax or diamox, but it seems not necessary at all to use . There is no need for lasix.

## 2016-07-23 NOTE — Progress Notes (Signed)
No oligoclonals.  Good results.

## 2016-07-23 NOTE — Telephone Encounter (Signed)
Patient is returning your call.  

## 2016-07-25 ENCOUNTER — Ambulatory Visit (INDEPENDENT_AMBULATORY_CARE_PROVIDER_SITE_OTHER): Payer: 59 | Admitting: Neurology

## 2016-07-25 DIAGNOSIS — R0683 Snoring: Secondary | ICD-10-CM

## 2016-07-25 DIAGNOSIS — R51 Headache: Secondary | ICD-10-CM

## 2016-07-25 DIAGNOSIS — G478 Other sleep disorders: Secondary | ICD-10-CM

## 2016-07-25 DIAGNOSIS — R519 Headache, unspecified: Secondary | ICD-10-CM

## 2016-07-25 DIAGNOSIS — G932 Benign intracranial hypertension: Secondary | ICD-10-CM

## 2016-08-03 NOTE — Procedures (Signed)
PATIENT'S NAME:  Kristen [name], Kristen [name] DOB:      1970-10-07      MR#:    [id number]     DATE OF RECORDING: 07/25/2016 REFERRING M.D.:  Vernona RiegerKatherine Clark, NP, Dr Fulton ReekGutierrez  Study Performed:   Baseline Polysomnogram HISTORY:  Kristen [name] is a 45 y.o. female  referred by  Dr. Sharen HonesGutierrez for a neurological evaluation of vision changes. She has mild papilledema dry eyes, and LP revealed  normal ICP.  Sleep study with capnography was ordered.  The patient's weight 209 pounds with a height of 62 (inches), resulting in a BMI of 38.5 kg/m2. The patient's neck circumference measured 17 inches.  CURRENT MEDICATIONS: Restasis, HCTZ, Protonix, Diamox, Xanax, Topamax   PROCEDURE:  This is a multichannel digital polysomnogram utilizing the Somnostar 11.2 system.  Electrodes and sensors were applied and monitored per AASM Specifications.   EEG, EOG, Chin and Limb EMG, were sampled at 200 Hz.  ECG, Snore and Nasal Pressure, Thermal Airflow, Respiratory Effort, CPAP Flow and Pressure, Oximetry was sampled at 50 Hz. Digital video and audio were recorded.      BASELINE STUDY  Lights Out was at 22:44 and Lights On at 05:09.  Total recording time (TRT) was 386 minutes, with a total sleep time (TST) of  332.5 minutes. The patient's sleep latency was 23 minutes.  REM latency was 82 minutes.  The sleep efficiency was 86.1 %.     SLEEP ARCHITECTURE: WASO (Wake after sleep onset) was 26.5 minutes.  There were 8.5 minutes in Stage N1, 251 minutes Stage N2, 51 minutes Stage N3 and 22 minutes in Stage REM.  The percentage of Stage N1 was 2.6%, Stage N2 was 75.5%, Stage N3 was 15.3% and Stage R (REM sleep) was 6.6%.    RESPIRATORY ANALYSIS:  There were a total of 21 respiratory events:  2 obstructive apneas, 0 central apneas and 0 mixed apneas with a total of 2 apneas and an apnea index (AI) of .4 /hour. There were 19 hypopneas with a hypopnea index of 3.4 /hour. The patient also had 0 respiratory event related arousals (RERAs).       The total APNEA/HYPOPNEA INDEX (AHI) was 3.8/hour and the total RESPIRATORY DISTURBANCE INDEX was 3.8 /hour.  12 events occurred in REM sleep and 14 events in NREM. The REM AHI was 32.7 /hour, versus a non-REM AHI of 1.7. The patient spent 155.5 minutes of total sleep time in the supine position and 177 minutes in non-supine. The supine AHI was 3.5 versus a non-supine AHI of 4.0.  OXYGEN SATURATION & C02:  The Wake baseline 02 saturation was 96%, with the lowest being 78%. Time spent below 89% saturation equaled 18 minutes.  Average End Tidal CO2 during sleep was 36. torr.  During REM, the average End Tidal CO2 during sleep was 38.8 torr.  During NREM, the average End Tidal CO2 during sleep was 35.8 torr.  Total sleep time greater than 40 torr was 0.6 minutes.   PERIODIC LIMB MOVEMENTS:   The patient had a total of 7 Periodic Limb Movements.  The Periodic Limb Movement (PLM) index was 1.3 and the PLM Arousal index was 0.0 /hour.  The arousals were noted as: 68 were spontaneous, 0 were associated with PLMs, 8 were associated with respiratory events. Audio and video analysis did not show any abnormal or unusual movements, behaviors, phonations or vocalizations.   The patient took 1 bathroom break. Very loud Snoring was noted. There was no clinically significant hypoxemia or hypercapnia.  EKG was in keeping with normal sinus rhythm (NSR).    IMPRESSION:  1. Mild, and clinically insignificant  Obstructive Sleep Apnea(OSA), which only accentuated during REM sleep to an AHI of  32.7 . 2. No arousals from  Periodic Limb Movement Disorder (PLMD) 3. Loud primary snoring and truncated airflow, indicative of upper airway resistance.   RECOMMENDATIONS:  Snoring treatment may include simple weight loss, dental device or ENT procedure.  CPAP is not indicated for this mild degree of apnea.  No hypercapnia/ hypoxemia found. The patient will not need to resume medication such as Diamox or  Topiramate, and ICP was normal.   1. A follow up appointment will [facility]. The referring provider will be notified of the results.      I certify that I have reviewed the entire raw data recording prior to the issuance of this report in accordance with the Standards of Accreditation of the American Academy of Sleep Medicine (AASM)    Melvyn Novasarmen Jerrel Tiberio, MD 08-02-2016 Diplomat, American Board of Psychiatry and Neurology  Diplomat, American Board of Sleep Medicine Medical Director of [facility], an AASM accredited facility

## 2016-08-13 ENCOUNTER — Telehealth: Payer: Self-pay

## 2016-08-13 DIAGNOSIS — H524 Presbyopia: Secondary | ICD-10-CM | POA: Diagnosis not present

## 2016-08-13 DIAGNOSIS — H04129 Dry eye syndrome of unspecified lacrimal gland: Secondary | ICD-10-CM | POA: Diagnosis not present

## 2016-08-13 DIAGNOSIS — H538 Other visual disturbances: Secondary | ICD-10-CM | POA: Diagnosis not present

## 2016-08-13 NOTE — Telephone Encounter (Signed)
Patient is aware of results and recommendations. She states that she saw Neuro-optho today and they will be sending notes. PCP was sent copy of report.

## 2016-08-13 NOTE — Telephone Encounter (Signed)
-----   Message from Melvyn Novasarmen Dohmeier, MD sent at 08/03/2016 10:14 AM EST ----- No apnea and neither high Co2 nor low Oxygen. This patient only had loud snoring. Main recommendation to treat is weight loss.

## 2016-08-16 ENCOUNTER — Encounter: Payer: Self-pay | Admitting: Neurology

## 2016-09-03 ENCOUNTER — Other Ambulatory Visit: Payer: 59

## 2016-09-03 ENCOUNTER — Encounter: Payer: Self-pay | Admitting: Primary Care

## 2016-09-03 ENCOUNTER — Ambulatory Visit (INDEPENDENT_AMBULATORY_CARE_PROVIDER_SITE_OTHER): Payer: 59 | Admitting: Primary Care

## 2016-09-03 VITALS — BP 146/94 | HR 73 | Temp 98.0°F | Ht 62.0 in | Wt 213.0 lb

## 2016-09-03 DIAGNOSIS — I10 Essential (primary) hypertension: Secondary | ICD-10-CM

## 2016-09-03 DIAGNOSIS — R601 Generalized edema: Secondary | ICD-10-CM

## 2016-09-03 DIAGNOSIS — K219 Gastro-esophageal reflux disease without esophagitis: Secondary | ICD-10-CM

## 2016-09-03 DIAGNOSIS — E669 Obesity, unspecified: Secondary | ICD-10-CM

## 2016-09-03 DIAGNOSIS — H539 Unspecified visual disturbance: Secondary | ICD-10-CM | POA: Diagnosis not present

## 2016-09-03 LAB — BASIC METABOLIC PANEL
BUN: 12 mg/dL (ref 6–23)
CO2: 25 mEq/L (ref 19–32)
Calcium: 9 mg/dL (ref 8.4–10.5)
Chloride: 106 mEq/L (ref 96–112)
Creatinine, Ser: 0.89 mg/dL (ref 0.40–1.20)
GFR: 88.17 mL/min (ref >60.00)
Glucose, Bld: 94 mg/dL (ref 70–99)
Potassium: 3.9 mEq/L (ref 3.5–5.1)
Sodium: 138 mEq/L (ref 135–145)

## 2016-09-03 LAB — BRAIN NATRIURETIC PEPTIDE: Pro B Natriuretic peptide (BNP): 35 pg/mL (ref 0.0–100.0)

## 2016-09-03 NOTE — Progress Notes (Signed)
Subjective:    Patient ID: Kristen [name], female    DOB: Jan 06, 1971, 46 y.o.   MRN: [id number]  HPI  Ms. Kristen [name] is a 46 year old female who presents today for follow up.  1) Intracranial Hypertension: Currently following with Neurology. Originally seen in Summer of 2017 for complaints of fatigue, difficulty with memory, blurred vision. Full work up for thyroid etiology completed which was unremarkable. She has had numerous eye exams which were determined to be overall unremarkable. Currently seeing Neurology who has completed testing including MRI, lumbar puncture, labs, split night polysomnography. MRI was suspicious for intracranial hypertension, but was considered negative after further evaluation.   She was prescribed Topamax for headaches but has not taken it due to side effects. Her sleep study was negative for sleep apnea. She was told to lose weight. She is motivated on losing weight and would like to start the ketogenic diet. She wants to make sure her adrenal and pituitary glands are stable to start the diet. She also wants a full panel of additional labs as she's had no conclusion.   She has noticed bilateral chest "sensation/discomfort" that is intermittent mostly at rest. She denies abdominal pain, diaphoresis, nausea, radiation of chest discomfort. She is under a lot of stress. She does have a history of GERD and has not been taking her pantoprazole daily. She endorses a poor diet and does not exercise. She has continued to notice generalized fluid retention, headaches, fatigue, difficulty with memory, and is frustrated that there's no conclusion.  Review of Systems  Constitutional: Positive for fatigue. Negative for unexpected weight change.  HENT: Negative for congestion.   Eyes: Positive for visual disturbance.  Respiratory: Negative for shortness of breath.   Cardiovascular: Negative for chest pain and palpitations.  Gastrointestinal:       GERD symptoms  Neurological:  Positive for headaches.       Past Medical History:  Diagnosis Date  . Essential hypertension   . GERD (gastroesophageal reflux disease)   . IBS (irritable bowel syndrome)      Social History   Social History  . Marital status: Legally Separated    Spouse name: N/A  . Number of children: 3  . Years of education: 1718   Occupational History  .       [employer], [employer]   Social History Main Topics  . Smoking status: Never Smoker  . Smokeless tobacco: Never Used  . Alcohol use No     Comment: rare  . Drug use: No  . Sexual activity: Not on file   Other Topics Concern  . Not on file   Social History Narrative   Separated.    3 children.    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     Past Surgical History:  Procedure Laterality Date  . ADENOIDECTOMY     lymph nodes removed  . TONSILLECTOMY    . TUBAL LIGATION      Family History  Problem Relation Age of Onset  . Hypertension Mother   . Depression Mother   . Diabetes Maternal Grandmother   . Hypertension Maternal Grandmother   . Colon cancer Father [id number]    Allergies  Allergen Reactions  . Amoxicillin Nausea Only  . Penicillin G Rash    Current Outpatient Prescriptions on File Prior to Visit  Medication Sig Dispense Refill  . hydrochlorothiazide (HYDRODIURIL) 25 MG tablet Take 1 tablet (25 mg  total) by mouth daily. 30 tablet 3  . pantoprazole (PROTONIX) 40 MG tablet Take 1 tablet (40 mg total) by mouth daily as needed. 30 tablet 0  . ALPRAZolam (XANAX) 0.5 MG tablet Take 1 tablet (0.5 mg total) by mouth at bedtime as needed for anxiety. (Patient not taking: Reported on 07/11/2016) 30 tablet 0  . cycloSPORINE (RESTASIS MULTIDOSE) 0.05 % ophthalmic emulsion Place 1 drop into both eyes 2 (two) times daily. 90 days (Patient not taking: Reported on 09/03/2016) 18 mL 1   Current Facility-Administered Medications on File Prior to Visit  Medication Dose  Route Frequency Provider Last Rate Last Dose  . gadopentetate dimeglumine (MAGNEVIST) injection 20 mL  20 mL Intravenous Once PRN Melvyn Novas, MD        BP (!) 146/94   Pulse 73   Temp 98 F (36.7 C) (Oral)   Ht 5\' 2"  (1.575 m)   Wt 213 lb (96.6 kg)   LMP 08/27/2016   SpO2 96%   BMI 38.96 kg/m    Objective:   Physical Exam  Constitutional: She appears well-nourished.  Neck: Neck supple. No thyromegaly present.  Cardiovascular: Normal rate and regular rhythm.   Pulmonary/Chest: Effort normal and breath sounds normal.  Skin: Skin is warm and dry.  Psychiatric:  Anxious and frustrated with lack of conclusive results          Assessment & Plan:

## 2016-09-03 NOTE — Assessment & Plan Note (Signed)
Suspect chest "sensation" contributed. Also suspect increased stress to be a cause. Will have her restart her pantoprazole x 4 weeks, then reduce to H2 Blocker.

## 2016-09-03 NOTE — Progress Notes (Signed)
Pre visit review using our clinic review tool, if applicable. No additional management support is needed unless otherwise documented below in the visit note. 

## 2016-09-03 NOTE — Patient Instructions (Addendum)
Complete lab work prior to leaving today. I will notify you of your results once received.   Restart your pantoprazole tablets and continue these for at least 4 weeks.  It's important to improve your diet by reducing consumption of fast food, fried food, processed snack foods. Increase consumption of fresh vegetables and fruits, whole grains, water.  Ensure you are drinking 64 ounces of water daily.  Start exercising. You should be getting 150 minutes of moderate intensity exercise weekly.  It was a pleasure to see you today!

## 2016-09-03 NOTE — Assessment & Plan Note (Signed)
Continues to experience. Thorough work up per Neurology. Check additional labs today. Discussed stress reduction techniques and importance of weight loss.

## 2016-09-03 NOTE — Assessment & Plan Note (Signed)
Slightly above goal today, just took HCTZ prior to arrival. Consider low dose Sprinolactone due to fluid retention and hypertension. Will await labs.

## 2016-09-03 NOTE — Assessment & Plan Note (Signed)
Diet consisting of fast food nearly everyday, does not exercise. Discussed how this diet can lead to fluid retention, fatigue, etc. Strongly encourage she change lifestyle and recommended Dr. Dalbert [place]asley in [place]. She may try the ketogenic diet.

## 2016-09-05 LAB — ALDOSTERONE: Aldosterone, Serum: 11 ng/dL

## 2016-11-01 ENCOUNTER — Encounter: Payer: Self-pay | Admitting: Neurology

## 2016-11-18 ENCOUNTER — Telehealth: Payer: Self-pay | Admitting: Primary Care

## 2016-11-18 NOTE — Telephone Encounter (Signed)
-----   Message from Robert Bellow, LPN sent at 11/12/8117  8:37 AM EDT ----- Regarding: Rash to face Jae Dire,  I just went through my labs. Dr. Vickey Huger did an ANA with reflex on 06/08/16. It was negative.   The rash comes and goes. Honestly, I can't tell you how frequently it does this as I am so used to it now.   Are they are any other tests available for lupus other than ANA?   I promise you that I am not trying to be sick especially with lupus. I am just trying to understand why I feel so fatigued all the time.   Thanks, Virl Axe

## 2016-11-18 NOTE — Telephone Encounter (Signed)
Noted. Will address with patient. She's had an extensive work up for numerous autoimmune causes for symptoms of which were all negative. Highly suspect fatigue related to obesity secondary to poor diet and lack of physical exercise. Also consider psychological component.

## 2016-11-20 ENCOUNTER — Other Ambulatory Visit: Payer: Self-pay | Admitting: Primary Care

## 2016-11-20 DIAGNOSIS — Z1231 Encounter for screening mammogram for malignant neoplasm of breast: Secondary | ICD-10-CM

## 2016-12-04 ENCOUNTER — Ambulatory Visit
Admission: RE | Admit: 2016-12-04 | Discharge: 2016-12-04 | Disposition: A | Discharge status: Home / Self Care | Payer: 59 | Source: Ambulatory Visit | Attending: Primary Care | Admitting: Primary Care

## 2016-12-04 DIAGNOSIS — Z1231 Encounter for screening mammogram for malignant neoplasm of breast: Secondary | ICD-10-CM | POA: Diagnosis not present

## 2016-12-26 ENCOUNTER — Encounter: Payer: Self-pay | Admitting: Physician Assistant

## 2016-12-26 ENCOUNTER — Encounter: Payer: Self-pay | Admitting: Primary Care

## 2016-12-26 ENCOUNTER — Ambulatory Visit (INDEPENDENT_AMBULATORY_CARE_PROVIDER_SITE_OTHER): Payer: 59 | Admitting: Primary Care

## 2016-12-26 VITALS — BP 122/80 | HR 76 | Temp 98.0°F | Ht 62.0 in | Wt 214.1 lb

## 2016-12-26 DIAGNOSIS — R1032 Left lower quadrant pain: Secondary | ICD-10-CM

## 2016-12-26 DIAGNOSIS — Z1211 Encounter for screening for malignant neoplasm of colon: Secondary | ICD-10-CM | POA: Diagnosis not present

## 2016-12-26 DIAGNOSIS — I10 Essential (primary) hypertension: Secondary | ICD-10-CM

## 2016-12-26 LAB — COMPREHENSIVE METABOLIC PANEL
ALT: 18 U/L (ref 0–35)
AST: 18 U/L (ref 0–37)
Albumin: 4 g/dL (ref 3.5–5.2)
Alkaline Phosphatase: 34 U/L — ABNORMAL LOW (ref 39–117)
BUN: 8 mg/dL (ref 6–23)
CO2: 28 mEq/L (ref 19–32)
Calcium: 9.2 mg/dL (ref 8.4–10.5)
Chloride: 107 mEq/L (ref 96–112)
Creatinine, Ser: 0.83 mg/dL (ref 0.40–1.20)
GFR: 95.43 mL/min (ref >60.00)
Glucose, Bld: 88 mg/dL (ref 70–99)
Potassium: 4 mEq/L (ref 3.5–5.1)
Sodium: 139 mEq/L (ref 135–145)
Total Bilirubin: 0.5 mg/dL (ref 0.2–1.2)
Total Protein: 7.4 g/dL (ref 6.0–8.3)

## 2016-12-26 LAB — CBC WITH DIFFERENTIAL/PLATELET
Basophils Absolute: 0.1 10*3/uL (ref 0.0–0.1)
Basophils Relative: 0.8 % (ref 0.0–3.0)
Eosinophils Absolute: 0.1 10*3/uL (ref 0.0–0.7)
Eosinophils Relative: 1.7 % (ref 0.0–5.0)
HCT: 37.2 % (ref 36.0–46.0)
Hemoglobin: 12.4 g/dL (ref 12.0–15.0)
Lymphocytes Relative: 29.8 % (ref 12.0–46.0)
Lymphs Abs: 2.2 10*3/uL (ref 0.7–4.0)
MCHC: 33.3 g/dL (ref 30.0–36.0)
MCV: 96.2 fl (ref 78.0–100.0)
Monocytes Absolute: 0.7 10*3/uL (ref 0.1–1.0)
Monocytes Relative: 10.2 % (ref 3.0–12.0)
Neutro Abs: 4.2 10*3/uL (ref 1.4–7.7)
Neutrophils Relative %: 57.5 % (ref 43.0–77.0)
Platelets: 309 10*3/uL (ref 150.0–400.0)
RBC: 3.87 Mil/uL (ref 3.87–5.11)
RDW: 12.9 % (ref 11.5–15.5)
WBC: 7.3 10*3/uL (ref 4.0–10.5)

## 2016-12-26 MED ORDER — HYDROCHLOROTHIAZIDE 25 MG PO TABS: 25.0000 mg | ORAL_TABLET | Freq: Every day | ORAL | 1 refills | Status: DC

## 2016-12-26 MED ORDER — PANTOPRAZOLE SODIUM 40 MG PO TBEC: 40.0000 mg | DELAYED_RELEASE_TABLET | Freq: Every day | ORAL | 1 refills | Status: DC | PRN

## 2016-12-26 NOTE — Progress Notes (Signed)
Subjective:    Patient ID: Kristen [name], female    DOB: 04-11-1971, 46 y.o.   MRN: [id number]  HPI  Ms. Kristen [name] is a 46 year old female with a history of diverticulitis who presents today with a chief complaint of abdominal pain. She also reports constipation. Her abdominal pain is located to the left lower quadrant which began Saturday (4 days ago). Her pain originally began as a sharp, cramping pain that shot up through her vagina. This pain has since resolved.   She's also had moderate bloating with constipation. She's taken Miralax on Saturday night and Monday without much improvement in constipation or bloating. She took an entire bottle of magnesium citrate last night and has had numerous bowel movements since.   Overall her left lower quadrant pain continues to bother her intermittently, only when having bowel movements and describes her pain as sharp. She is able to eat and drink without pain. She has noticed mild nausea, denies vomiting and fevers, bloody stools.   She would also like to be checked for celiac disease. She recently went on a diet where she limited wheat and felt much better. She recently started eating wheat again and started feeling worse. She has chronic fatigue.  Review of Systems  Constitutional: Negative for chills and fever.  Gastrointestinal: Positive for abdominal pain, constipation and nausea. Negative for blood in stool and vomiting.  Genitourinary: Negative for vaginal discharge.       Past Medical History:  Diagnosis Date  . Diverticulitis   . Essential hypertension   . GERD (gastroesophageal reflux disease)   . IBS (irritable bowel syndrome)      Social History   Social History  . Marital status: Legally Separated    Spouse name: N/A  . Number of children: 3  . Years of education: 3818   Occupational History  .      [employer], [employer]   Social History Main Topics  . Smoking status: Never Smoker  . Smokeless tobacco:  Never Used  . Alcohol use No     Comment: rare  . Drug use: No  . Sexual activity: Not on file   Other Topics Concern  . Not on file   Social History Narrative   Separated.    3 children.    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     Past Surgical History:  Procedure Laterality Date  . ADENOIDECTOMY     lymph nodes removed  . TONSILLECTOMY    . TUBAL LIGATION      Family History  Problem Relation Age of Onset  . Hypertension Mother   . Depression Mother   . Diabetes Maternal Grandmother   . Hypertension Maternal Grandmother   . Colon cancer Father [id number]    Allergies  Allergen Reactions  . Amoxicillin Nausea Only  . Penicillin G Rash    Current Outpatient Prescriptions on File Prior to Visit  Medication Sig Dispense Refill  . ALPRAZolam (XANAX) 0.5 MG tablet Take 1 tablet (0.5 mg total) by mouth at bedtime as needed for anxiety. (Patient not taking: Reported on 07/11/2016) 30 tablet 0  . cycloSPORINE (RESTASIS MULTIDOSE) 0.05 % ophthalmic emulsion Place 1 drop into both eyes 2 (two) times daily. 90 days (Patient not taking: Reported on 09/03/2016) 18 mL 1   Current Facility-Administered Medications on File Prior to Visit  Medication Dose Route Frequency Provider Last Rate Last Dose  .  gadopentetate dimeglumine (MAGNEVIST) injection 20 mL  20 mL Intravenous Once PRN Dohmeier, Porfirio Mylar, MD        BP 122/80   Pulse 76   Temp 98 F (36.7 C) (Oral)   Ht 5\' 2"  (1.575 m)   Wt 214 lb 1.9 oz (97.1 kg)   LMP 12/11/2016   SpO2 98%   BMI 39.16 kg/m    Objective:   Physical Exam  Constitutional: She appears well-nourished.  Neck: Neck supple.  Cardiovascular: Normal rate and regular rhythm.   Pulmonary/Chest: Effort normal and breath sounds normal.  Abdominal: Soft. Normal appearance. Bowel sounds are increased. There is tenderness in the left lower quadrant.  Skin: Skin is warm and dry.          Assessment & Plan:    Abdominal Pain:  Located to LLQ with constipation and bloating x 4 days. Exam today with tenderness to LLQ. Doesn't appear sickly. Given history of diverticulitis, will obtain blood work and keep in differential list. Will also check for Celiac disease per patient request, although unlikely. Bowels are moving now with magnesium citrate, this could be contributing to abdominal discomfort. Will await lab results. She will update if symptoms progress.  Morrie Sheldon, NP

## 2016-12-26 NOTE — Patient Instructions (Signed)
Complete lab work prior to leaving today. I will notify you of your results once received.   Stop by the front desk and speak with either Kristen [name] or Kristen [name] regarding your referral to GI for the colonoscopy.  Avoid spicy foods, fried foods, fatty foods, fast food as this will cause increased irritation.   It was a pleasure to see you today!

## 2016-12-31 LAB — CELIAC PNL 2 RFLX ENDOMYSIAL AB TTR
(tTG) Ab, IgA: <1 U/mL
(tTG) Ab, IgG: 2 U/mL
Endomysial Ab IgA: NEGATIVE
Gliadin(Deam) Ab,IgA: 12 U (ref <20)
Gliadin(Deam) Ab,IgG: 3 U (ref <20)
Immunoglobulin A: 433 mg/dL (ref 81–463)

## 2017-01-03 ENCOUNTER — Encounter: Payer: Self-pay | Admitting: Physician Assistant

## 2017-01-03 ENCOUNTER — Ambulatory Visit (INDEPENDENT_AMBULATORY_CARE_PROVIDER_SITE_OTHER): Payer: 59 | Admitting: Physician Assistant

## 2017-01-03 VITALS — BP 122/84 | HR 75 | Ht 61.0 in | Wt 214.0 lb

## 2017-01-03 DIAGNOSIS — R1032 Left lower quadrant pain: Secondary | ICD-10-CM

## 2017-01-03 MED ORDER — NA SULFATE-K SULFATE-MG SULF 17.5-3.13-1.6 GM/177ML PO SOLN: 1.0000 | Freq: Once | ORAL | 0 refills | Status: AC

## 2017-01-03 NOTE — Addendum Note (Signed)
Addended by: Alexis FrockDD, Markell Sciascia V. on: 01/03/2017 04:20 PM   Modules accepted: Orders

## 2017-01-03 NOTE — Patient Instructions (Addendum)
You have been scheduled for a colonoscopy. Please follow written instructions given to you at your visit today.  Please pick up your prep supplies at the pharmacy within the next 1-3 days.[place].  If you use inhalers (even only as needed), please bring them with you on the day of your procedure. Your physician has requested that you go to www.startemmi.com and enter the access code given to you at your visit today. This web site gives a general overview about your procedure. However, you should still follow specific instructions given to you by our office regarding your preparation for the procedure.   You have been scheduled for a CT scan of the abdomen and pelvis at [facility] ([address]---this is in the same building as Press [facility]).   You are scheduled on Friday 01-04-2017 at 4:00 PM. You should arrive 3:45 PM  to your appointment time for registration. Please follow the written instructions below on the day of your exam:  WARNING: IF YOU ARE ALLERGIC TO IODINE/X-RAY DYE, PLEASE NOTIFY RADIOLOGY IMMEDIATELY AT [phone]! YOU WILL BE GIVEN A 13 HOUR PREMEDICATION PREP.  1) Do not eat  anything after 1:00 PM(4 hours prior to your test) 2) You have been given 2 bottles of oral contrast to drink. The solution may taste               better if refrigerated, but do NOT add ice or any other liquid to this solution. Shake             well before drinking.    Drink 1 bottle of contrast @ 12:30 PM (2 hours prior to your exam)  Drink 1 bottle of contrast @ 1:30 PM (1 hour prior to your exam)  You may take any medications as prescribed with a small amount of water except for the following: Metformin, Glucophage, Glucovance, Avandamet, Riomet, Fortamet, Actoplus Met, Janumet, Glumetza or Metaglip. The above medications must be held the day of the exam AND 48 hours after the exam.  The purpose of you drinking the oral contrast is to aid in the visualization of  your intestinal tract. The contrast solution may cause some diarrhea. Before your exam is started, you will be given a small amount of fluid to drink. Depending on your individual set of symptoms, you may also receive an intravenous injection of x-ray contrast/dye. Plan on being at [facility] for 30 minutes or long, depending on the type of exam you are having performed.  If you have any questions regarding your exam or if you need to reschedule, you may call the CT department at [phone] between the hours of 8:00 am and 5:00 pm, Monday-Friday.  ________________________________________________________________________

## 2017-01-03 NOTE — Progress Notes (Addendum)
Subjective:    Patient ID: Kristen [name], female    DOB: February 15, 1971, 46 y.o.   MRN: [id number]  HPI Kristen [name] is a pleasant 46 year old African-American female, new to GI today referred by Kristen Peltatherine Clarke NP/Kristen [name] for evaluation of left-sided abdominal pain and consideration of colonoscopy. Patient has history of hypertension, chronic fatigue, and obesity. She relates family history of colon cancer in her father diagnosed at age 46-50. No other family members with colon cancer that she is aware of. Patient states she underwent an appendectomy at age 46 and family apparently told at that time that she had diverticulitis. She's not had any prior colonoscopy. She says she has had some issues with her stomach off and on for years. She had problems with GERD previously and was on Protonix but is only using that as needed over the past few years. She was told at one point that she had IBS. She says she has had some problems with constipation alternating bowel habits. She was tried on Linzess by her PCP but says that was not helpful and just caused cramping.  More acutely she says that she developed left-sided abdominal pain a couple of weeks ago which was quite uncomfortable at onset with some pain radiating into her back and shooting into her vaginal area. She says she was constipated at that time took a bottle of mag citrate and within the next couple of days she felt better and acute pain resolved. She has not had any associated fever or chills, no nausea or vomiting. She's been able to eat without difficulty. She does not do well with MiraLAX which causes bloating.  Labs done 12/26/2016 with normal CBC, normal CMET,and celiac panel also negative.  Review of Systems Pertinent positive and negative review of systems were noted in the above HPI section.  All other review of systems was otherwise negative.  Outpatient Encounter Prescriptions as of 01/03/2017  Medication Sig  .  hydrochlorothiazide (HYDRODIURIL) 25 MG tablet Take 1 tablet (25 mg total) by mouth daily.  Kristen [name]. Lifitegrast (XIIDRA OP) Apply to eye.  . pantoprazole (PROTONIX) 40 MG tablet Take 1 tablet (40 mg total) by mouth daily as needed.  . [DISCONTINUED] ALPRAZolam (XANAX) 0.5 MG tablet Take 1 tablet (0.5 mg total) by mouth at bedtime as needed for anxiety.  . [DISCONTINUED] cycloSPORINE (RESTASIS MULTIDOSE) 0.05 % ophthalmic emulsion Place 1 drop into both eyes 2 (two) times daily. 90 days   Facility-Administered Encounter Medications as of 01/03/2017  Medication  . gadopentetate dimeglumine (MAGNEVIST) injection 20 mL   Allergies  Allergen Reactions  . Amoxicillin Nausea Only  . Penicillin G Rash   Patient Active Problem List   Diagnosis Date Noted  . Obesity (BMI 30-39.9) 09/03/2016  . Intracranial hypertension, benign 06/22/2016  . Visual disturbance 05/18/2016  . Other fatigue 03/01/2016  . Preventative health care 01/03/2016  . Insomnia 11/29/2015  . Essential hypertension 11/11/2015  . GERD (gastroesophageal reflux disease) 11/11/2015   Social History   Social History  . Marital status: Legally Separated    Spouse name: N/A  . Number of children: 3  . Years of education: 3218   Occupational History  .      Kristen [name], Health Coach   Social History Main Topics  . Smoking status: Never Smoker  . Smokeless tobacco: Never Used  . Alcohol use No  . Drug use: No  . Sexual activity: Not Currently    Partners: Male    Birth control/ protection:  Surgical   Other Topics Concern  . Not on file   Social History Narrative   Separated.    3 children.    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     Kristen [name]'s family history includes Colon cancer (age of onset: 10) in her father; Depression in her mother; Diabetes in her maternal grandmother; Hypertension in her maternal grandmother and mother.      Objective:    Vitals:     01/03/17 1401  BP: 122/84  Pulse: 75    Physical Exam well-developed African-American female in no acute distress, pleasant blood pressure 122/84 pulse 75, height 5 foot 1, weight 214, BMI 40.4. HEENT ;nontraumatic normocephalic EOMI PERRLA sclera anicteric, Cardiovascular; regular rate and rhythm with S1-S2 no murmur or gallop, Pulmonary ;clear bilaterally, Abdomen; obese, soft, bowel sounds are present she does have some tenderness in the left lower quadrant and suprapubic area, there is no guarding or rebound no palpable mass or hepatosplenomegaly, rectal; exam not done, Extremities; no clubbing cyanosis or edema skin warm and dry, Neuropsych ;mood and affect appropriate       Assessment & Plan:   #8 46 year old African-American female with 2 week history of left-sided abdominal discomfort and constipation. Acute symptoms resolved after bowel purge however on exam she remains tender in the left lower quadrant and suprapubic area. Cannot rule out mild diverticulitis or other intra-abdominal inflammatory process. Also consider ovarian cyst #2 positive family history of colon cancer in patient's father age 80-50 #3 hypertension #4 obesity  Plan; Patient will be scheduled for CT scan of the abdomen and pelvis with contrast.   Will also schedule for colonoscopy with Dr. Rhea [name]. Procedure was discussed in detail with the patient including risks and benefits and she is agreeable to proceed.  If CT scan does show evidence of diverticulitis or other issue, can reschedule colonoscopy accordingly.  Kristen [name] S Kristen Greth PA-C 01/03/2017   Cc: Kristen Nest, NP  Addendum: Reviewed and agree with initial management. Kristen [name], Kristen Caddy, MD

## 2017-01-04 ENCOUNTER — Ambulatory Visit (INDEPENDENT_AMBULATORY_CARE_PROVIDER_SITE_OTHER)
Admission: RE | Admit: 2017-01-04 | Discharge: 2017-01-04 | Disposition: A | Discharge status: Home / Self Care | Payer: 59 | Source: Ambulatory Visit | Attending: Physician Assistant | Admitting: Physician Assistant

## 2017-01-04 DIAGNOSIS — R1032 Left lower quadrant pain: Secondary | ICD-10-CM

## 2017-01-04 DIAGNOSIS — R109 Unspecified abdominal pain: Secondary | ICD-10-CM | POA: Diagnosis not present

## 2017-01-04 MED ORDER — IOPAMIDOL (ISOVUE-300) INJECTION 61%
100.0000 mL | Freq: Once | INTRAVENOUS | Status: AC | PRN
  Administered 2017-01-04: 100 mL via INTRAVENOUS

## 2017-01-07 ENCOUNTER — Other Ambulatory Visit: Payer: Self-pay | Admitting: Primary Care

## 2017-01-07 DIAGNOSIS — N83202 Unspecified ovarian cyst, left side: Secondary | ICD-10-CM

## 2017-01-08 ENCOUNTER — Encounter: Payer: Self-pay | Admitting: Obstetrics & Gynecology

## 2017-01-08 ENCOUNTER — Ambulatory Visit (INDEPENDENT_AMBULATORY_CARE_PROVIDER_SITE_OTHER): Payer: 59 | Admitting: Obstetrics & Gynecology

## 2017-01-08 ENCOUNTER — Encounter: Payer: Self-pay | Admitting: Internal Medicine

## 2017-01-08 ENCOUNTER — Other Ambulatory Visit (HOSPITAL_COMMUNITY)
Admission: RE | Admit: 2017-01-08 | Discharge: 2017-01-08 | Disposition: A | Discharge status: Home / Self Care | Payer: 59 | Source: Ambulatory Visit | Attending: Obstetrics & Gynecology | Admitting: Obstetrics & Gynecology

## 2017-01-08 VITALS — BP 126/86 | HR 82 | Resp 16 | Ht 62.5 in | Wt 213.0 lb

## 2017-01-08 DIAGNOSIS — N852 Hypertrophy of uterus: Secondary | ICD-10-CM

## 2017-01-08 DIAGNOSIS — N921 Excessive and frequent menstruation with irregular cycle: Secondary | ICD-10-CM

## 2017-01-08 DIAGNOSIS — L680 Hirsutism: Secondary | ICD-10-CM

## 2017-01-08 DIAGNOSIS — Z124 Encounter for screening for malignant neoplasm of cervix: Secondary | ICD-10-CM | POA: Diagnosis not present

## 2017-01-08 DIAGNOSIS — N83202 Unspecified ovarian cyst, left side: Secondary | ICD-10-CM

## 2017-01-08 DIAGNOSIS — N838 Other noninflammatory disorders of ovary, fallopian tube and broad ligament: Secondary | ICD-10-CM

## 2017-01-08 DIAGNOSIS — N920 Excessive and frequent menstruation with regular cycle: Secondary | ICD-10-CM | POA: Diagnosis not present

## 2017-01-08 NOTE — Progress Notes (Signed)
GYNECOLOGY  VISIT   HPI: 46 y.o. [phone] [name] here for discussion of menstrual cycles and enlarged ovarian cyst that was noted on CT scan done due to pain.  Concern was present that she had possible diverticulitis.  Pain started over the weekend and was so severe that she ended up going to see PCP.  CT was obtained showing bilaterally enlarged ovaries with 3cm "intermediate cyst".  Pt states she did some reading about this and is wondering about PCOS.  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  Very interested in proceeding with evaluation for this.  Also reports her cycles are very heavy and prolonged.  Cycles usually start with two days of brown, lighter flow and then become very heavy for three to four days.  During this time, she uses the infinity pads (which are urinary incontinence pads) due to the volume they will hold and and still has to change them every two hours for about three days.  Then she will have spotting for days afterwards.  In May, she spotting for almost 14 more days after the heavy bleeding slowed down.  She never actually stopped bleeding in May before cycle started again.  Cycles seem to be getting closer together to her as well.  Reports she is completely sick of bleeding and would like definitive treatment for this if appropriate and possible.  Works two jobs--[employer] during the week and [employer] over the weekend.  Feels fatigue all of the time.  Not sure if this is related to bleeding.  PCP has done a lot of blood work in the past few years that has not really shown anything specific.  Pt did have CBC within the last year that did not show anemia but reports this has been an off and on problem for her in the past.  GYNECOLOGIC HISTORY: Patient's last menstrual period was 01/01/2017. Contraception: tubal ligation  Menopausal hormone therapy: none  Patient Active Problem List   Diagnosis Date Noted  . Obesity (BMI 30-39.9) 09/03/2016  . Intracranial hypertension, benign 06/22/2016  . Visual disturbance 05/18/2016  . Other fatigue 03/01/2016  . Preventative health care 01/03/2016  . Insomnia 11/29/2015  . Essential hypertension 11/11/2015  . GERD (gastroesophageal reflux disease) 11/11/2015    Past Medical History:  Diagnosis Date  . Abnormal Pap smear of cervix    dysplasia   . Anemia   . Anxiety   . Depression   . Diverticulitis   . Dysmenorrhea   . Essential hypertension   . GERD (gastroesophageal reflux disease)   . IBS (irritable bowel syndrome)   . STD (sexually transmitted disease) ~1998   chlamydia     Past Surgical History:  Procedure Laterality Date  . ADENOIDECTOMY     lymph nodes removed  . APPENDECTOMY    . COLPOSCOPY  1992  . TONSILLECTOMY    . TUBAL LIGATION      MEDS:  Reviewed in EPIC and UTD  ALLERGIES: Amoxicillin and Penicillin g  Family History  Problem Relation Age of Onset  . Hypertension Mother   . Depression Mother   . Diabetes Maternal Grandmother   . Hypertension Maternal Grandmother   . Colon cancer Father 40  . Diabetes Brother   . Stomach cancer Neg Hx     SH:  Separated, non smoker  Review of Systems  Gastrointestinal: Positive for abdominal pain.  All other systems reviewed and are  negative.   PHYSICAL EXAMINATION:    BP 126/86 (BP Location: Left Arm, Patient Position: Sitting, Cuff Size: Large)   Pulse 82   Resp 16   Ht 5' 2.5" (1.588 m)   Wt 213 lb (96.6 kg)   LMP 01/01/2017   BMI 38.34 kg/m     General appearance: alert, cooperative and appears stated age Neck: no adenopathy, supple, symmetrical, trachea midline and thyroid normal to inspection and palpation CV:  Regular rate and rhythm Lungs:  clear to auscultation, no wheezes, rales or rhonchi, symmetric air entry Breasts: normal appearance, no masses or tenderness, very large and pendulous as well Abdomen: soft, non-tender; bowel  sounds normal; no masses,  no organomegaly  Pelvic: External genitalia:  no lesions              Urethra:  normal appearing urethra with no masses, tenderness or lesions              Bartholins and Skenes: normal                 Vagina: normal appearing vagina with normal color and discharge, no lesions              Cervix: no lesions              Bimanual Exam:  Uterus:  enlarged, 12-14 weeks size and anteflexed, mobile              Adnexa: no mass, fullness, tenderness              Anus:  no lesions  Endometrial biopsy recommended.  Discussed with patient.  Verbal and written consent obtained.   Procedure:  Speculum placed.  Cervix visualized and cleansed with betadine prep.  A single toothed tenaculum was applied to the anterior lip of the cervix.  Endometrial pipelle was advanced through the cervix into the endometrial cavity without difficulty.  Pipelle passed to 10cm.  Suction applied and pipelle removed with good tissue sample obtained.  Two passes performed.  Tenculum removed.  No bleeding noted.  Patient tolerated procedure well.  Chaperone was present for exam.  Assessment: Enlarged uterus Enlarged ovaries with 3cm cyst Hirsutism Fatigue Menorrhagia with irregular cycles  Plan: Pap and HR HPV obtained today Endometrial biopsy obtained Ferritin, CBC, and Testosterone level obtained today Will return for PUS She wants to go ahead and start thinking about surgical dates so desires to see surgical scheduler today.   ~45 minutes spent with patient >50% of time was in face to face discussion of above.

## 2017-01-09 ENCOUNTER — Encounter: Payer: Self-pay | Admitting: Obstetrics & Gynecology

## 2017-01-09 NOTE — Progress Notes (Signed)
Kennon RoundsSally from Dr. Rondel BatonMiller's office called to check with anesthesia to see if it was ok for Kristen [name] to have her colonoscopy with propofol on July 6th and have her surgery on July 10th.  I spoke with Dr. Sandford Craze. Jackson and she stated that the colonoscopy on the 6th would not interfere with Kristen [name] having her surgery on the 10th.

## 2017-01-10 ENCOUNTER — Telehealth: Payer: Self-pay

## 2017-01-10 LAB — CYTOLOGY - PAP
Diagnosis: NEGATIVE
HPV: NOT DETECTED

## 2017-01-10 LAB — CBC
Hematocrit: 38 % (ref 34.0–46.6)
Hemoglobin: 12.2 g/dL (ref 11.1–15.9)
MCH: 31.3 pg (ref 26.6–33.0)
MCHC: 32.1 g/dL (ref 31.5–35.7)
MCV: 97 fL (ref 79–97)
Platelets: 428 10*3/uL — ABNORMAL HIGH (ref 150–379)
RBC: 3.9 x10E6/uL (ref 3.77–5.28)
RDW: 13.2 % (ref 12.3–15.4)
WBC: 5.5 10*3/uL (ref 3.4–10.8)

## 2017-01-10 LAB — TESTOSTERONE, FREE, DIRECT
Testosterone, Free: 1.1 pg/mL (ref 0.0–4.2)
Testosterone, total: 15.4 ng/dL

## 2017-01-10 LAB — FERRITIN: Ferritin: 37 ng/mL (ref 15–150)

## 2017-01-10 NOTE — Telephone Encounter (Signed)
Dr.Miller, please review and advise labs from 01/08/2017.  Visit Follow-Up Question  Message [phone]  From Lu Duffelarlesia Pinson To Jerene BearsMary S Miller, MD Sent 01/09/2017 3:50 PM  Dr. Hyacinth MeekerMiller,   Have the results come back from the labs completed yesterday?   Also, I need to reschedule my colonoscopy. What is the timeframe you suggest after the surgery? In other words, do you feel October or November would be too soon?   Thank you, [name]   Responsible Party   Pool - [facility] Pool No one has taken responsibility for this message.  No actions have been taken on this message.

## 2017-01-10 NOTE — Telephone Encounter (Signed)
Telephone encounter created to discuss with Dr.Miller. 

## 2017-01-11 NOTE — Telephone Encounter (Signed)
This is all good with me.  Thanks for contacting anesthesia.  Ok to close encounter.

## 2017-01-11 NOTE — Telephone Encounter (Signed)
Call to patient. Per ROI, caan leave message on voice mail. Phone log has number confirmation.  Left message that hospital anesthesia has agreed to surgery date of 02-12-17 following colonoscopy on 02-08-17.  Have sent to Dr Hyacinth MeekerMiller for final approval. Left message to call back to review surgery instructions and schedule pre/post operative appointments.

## 2017-01-14 ENCOUNTER — Telehealth: Payer: Self-pay | Admitting: *Deleted

## 2017-01-14 ENCOUNTER — Other Ambulatory Visit: Payer: Self-pay | Admitting: Obstetrics & Gynecology

## 2017-01-14 NOTE — Telephone Encounter (Signed)
Patient is returning a call to NewrySally. Please use the work number to reach her.

## 2017-01-14 NOTE — Telephone Encounter (Signed)
Lab results reviewed with patient as directed by Dr Hyacinth MeekerMiller.   Encounter closed.

## 2017-01-14 NOTE — Telephone Encounter (Signed)
Call to patient. Advised of response from anesthesia and Dr Hyacinth MeekerMiller regarding colonoscopy. Surgery instruction sheet reviewed and printed copy will be mailed to patient.  Lab results reviewed with patient as directed by Dr Hyacinth MeekerMiller, see result noted.  Patient has pelvic ultrasound and surgery consult scheduled for 01-24-17.

## 2017-01-14 NOTE — Telephone Encounter (Signed)
-----   Message from Mary S Miller, MD sent at 01/11/2017  9:22 PM EDT ----- Please let pt know her pap is normal and HR HPV was negative.  02 recall.  Also, her endometrial biopsy was negative.    She does not have anemia.  Her WBC ct is normal.  Her platelet count is a little elevated but this is not in a range that needs anything except monitoring.  Her ferritin is normal at 37.  Lastly, her total and free testosterone levels are completely normal.  I have placed a referral to Amy McMichael, dermatology, at [facility].  Pt is scheduled for surgery.  She has an ultrasound planned and will assess ovaries at that point more thoroughly. 

## 2017-01-14 NOTE — Telephone Encounter (Signed)
Call to patient regarding surgery instructions. Lab results reviewed as directed by Dr Hyacinth MeekerMiller. See alternate phone encounter.  Encounter closed.

## 2017-01-14 NOTE — Telephone Encounter (Signed)
Left message to call regarding results -eh 

## 2017-01-14 NOTE — Telephone Encounter (Signed)
-----   Message from Jerene BearsMary S Miller, MD sent at 01/11/2017  9:22 PM EDT ----- Please let pt know her pap is normal and HR HPV was negative.  02 recall.  Also, her endometrial biopsy was negative.    She does not have anemia.  Her WBC ct is normal.  Her platelet count is a little elevated but this is not in a range that needs anything except monitoring.  Her ferritin is normal at 37.  Lastly, her total and free testosterone levels are completely normal.  I have placed a referral to Isaac LaudAmy McMichael, dermatology, at [facility].  Pt is scheduled for surgery.  She has an ultrasound planned and will assess ovaries at that point more thoroughly.

## 2017-01-23 ENCOUNTER — Other Ambulatory Visit: Payer: Self-pay | Admitting: *Deleted

## 2017-01-23 DIAGNOSIS — N838 Other noninflammatory disorders of ovary, fallopian tube and broad ligament: Secondary | ICD-10-CM

## 2017-01-23 DIAGNOSIS — N921 Excessive and frequent menstruation with irregular cycle: Secondary | ICD-10-CM

## 2017-01-23 DIAGNOSIS — N852 Hypertrophy of uterus: Secondary | ICD-10-CM

## 2017-01-23 DIAGNOSIS — N83202 Unspecified ovarian cyst, left side: Secondary | ICD-10-CM

## 2017-01-24 ENCOUNTER — Ambulatory Visit (INDEPENDENT_AMBULATORY_CARE_PROVIDER_SITE_OTHER): Payer: 59

## 2017-01-24 ENCOUNTER — Ambulatory Visit (INDEPENDENT_AMBULATORY_CARE_PROVIDER_SITE_OTHER): Payer: 59 | Admitting: Obstetrics & Gynecology

## 2017-01-24 VITALS — BP 126/90 | HR 88 | Resp 16 | Ht 62.5 in | Wt 213.0 lb

## 2017-01-24 DIAGNOSIS — N839 Noninflammatory disorder of ovary, fallopian tube and broad ligament, unspecified: Secondary | ICD-10-CM

## 2017-01-24 DIAGNOSIS — N83202 Unspecified ovarian cyst, left side: Secondary | ICD-10-CM

## 2017-01-24 DIAGNOSIS — N852 Hypertrophy of uterus: Secondary | ICD-10-CM | POA: Diagnosis not present

## 2017-01-24 DIAGNOSIS — N921 Excessive and frequent menstruation with irregular cycle: Secondary | ICD-10-CM

## 2017-01-24 DIAGNOSIS — R799 Abnormal finding of blood chemistry, unspecified: Secondary | ICD-10-CM | POA: Diagnosis not present

## 2017-01-24 DIAGNOSIS — Z0289 Encounter for other administrative examinations: Secondary | ICD-10-CM

## 2017-01-24 DIAGNOSIS — N838 Other noninflammatory disorders of ovary, fallopian tube and broad ligament: Secondary | ICD-10-CM

## 2017-01-24 DIAGNOSIS — R7989 Other specified abnormal findings of blood chemistry: Secondary | ICD-10-CM

## 2017-01-24 NOTE — Progress Notes (Signed)
46 y.o. [id number] [name] here for pelvic ultrasound due to reassess left adnexa as well as evaluate uterus for causes of menorrhagia.  Endometrial biopsy done 01/24/17 was normal.  Patient's last menstrual period was 01/01/2017.  Contraception: BTL  Findings:  UTERUS: 10.3 x 8.0 x 6.9cm with 7mm intramural anterior fibroid EMS:7.474mm ADNEXA: Left ovary: 3.7 x 3.4 x 2.6cm with 2.4cm hemorrhagic cyst with internal debris.  Lateral to ovary is a solid mobile mass measuring 14. X 11mm.  This is avascualr.  Septated fluid surrounds this mass.       Right ovary: 2.6 x 1.6 x 1.3cm CUL DE SAC: no free fluid  Discussion:  Findings reviewed with pt.  She clearly does not have PCOS appearing ovaries and her testosterone levels was completely normal.  I do not think PCOS is her diagnosis.  She has central alopecia and I think [facility].  Pt in agreement with this.  Pt still contemplating hysterectomy.  Would like both ovaries removed.  I do not feel this is appropriate given her ago and risks for weight changes, CVD and bone health.  Advised pt think very seriously about this decision as this is not something I would recommend.  Given improvement in side of left ovarian cyst, may be prudent to repeat PUS again before definitive decision about surgery as this may fully resolve and removal may not be necessary.  IUD as option for treatment also discussed with pt.  She is favoring more definitive treatment.  Assessment:  Menorrhagia, physical features c/w PCOS but normal ovaries and normal testosterone levels (free and total) Small fibroid Complex left hemorrhagic ovarian cyst with solid but mobile nodule lateral to this with septated fluid surrounding.  Plan:  Pt desirous of definiitve surgery but I am not comfortable removing both ovaries.   Will obtain FSH and ca-125 today Consider repeat PUS and Mirena IUD vs hysterectomy as  well.  ~25 minutes spent with patient >50% of time was in face to face discussion of above.

## 2017-01-25 LAB — CA 125: CA 125: 6.4 U/mL (ref 0.0–38.1)

## 2017-01-25 LAB — FOLLICLE STIMULATING HORMONE: FSH: 4.1 m[IU]/mL

## 2017-01-27 ENCOUNTER — Encounter: Payer: Self-pay | Admitting: Obstetrics & Gynecology

## 2017-01-31 ENCOUNTER — Encounter: Payer: Self-pay | Admitting: Obstetrics & Gynecology

## 2017-02-04 NOTE — Patient Instructions (Addendum)
Your procedure is scheduled on:  Tuesday, February 12, 2017  [facility] at:  8:15 AM  Pick up the phone at the desk and dial [phone].  Call this number if you have problems the morning of surgery: [phone].  Remember: Do NOT eat food or drink after:  Midnight Monday  Take these medicines the morning of surgery with a SIP OF WATER:   Pantoprazole  Stop ALL herbal medications at this time  Do NOT smoke the day of surgery.  Do NOT wear jewelry (body piercing), metal hair clips/bobby pins, make-up, artifical eyelashes or nail polish. Do NOT wear lotions, powders, or perfumes.  You may wear deodorant. Do NOT shave for 48 hours prior to surgery. Do NOT bring valuables to the hospital. Contacts, dentures, or bridgework may not be worn into surgery.  Leave suitcase in car.  After surgery it may be brought to your room.  For patients admitted to the hospital, checkout time is 11:00 AM the day of discharge.  Bring a copy of your healthcare power of attorney and living will documents.

## 2017-02-05 ENCOUNTER — Encounter (HOSPITAL_COMMUNITY)
Admission: RE | Admit: 2017-02-05 | Discharge: 2017-02-05 | Disposition: A | Discharge status: Home / Self Care | Payer: 59 | Source: Ambulatory Visit | Attending: Obstetrics & Gynecology | Admitting: Obstetrics & Gynecology

## 2017-02-05 ENCOUNTER — Other Ambulatory Visit: Payer: Self-pay

## 2017-02-05 ENCOUNTER — Encounter (HOSPITAL_COMMUNITY): Payer: Self-pay

## 2017-02-05 DIAGNOSIS — Z01810 Encounter for preprocedural cardiovascular examination: Secondary | ICD-10-CM | POA: Insufficient documentation

## 2017-02-05 DIAGNOSIS — Z01818 Encounter for other preprocedural examination: Secondary | ICD-10-CM | POA: Diagnosis not present

## 2017-02-05 DIAGNOSIS — N921 Excessive and frequent menstruation with irregular cycle: Secondary | ICD-10-CM | POA: Insufficient documentation

## 2017-02-05 DIAGNOSIS — N852 Hypertrophy of uterus: Secondary | ICD-10-CM | POA: Diagnosis not present

## 2017-02-05 HISTORY — DX: Headache, unspecified: R51.9

## 2017-02-05 HISTORY — DX: Headache: R51

## 2017-02-05 HISTORY — DX: Unspecified asthma, uncomplicated: J45.909

## 2017-02-05 HISTORY — DX: Dysplasia of cervix uteri, unspecified: N87.9

## 2017-02-05 LAB — CBC
HCT: 38.8 % (ref 36.0–46.0)
Hemoglobin: 13 g/dL (ref 12.0–15.0)
MCH: 32.1 pg (ref 26.0–34.0)
MCHC: 33.5 g/dL (ref 30.0–36.0)
MCV: 95.8 fL (ref 78.0–100.0)
Platelets: 366 10*3/uL (ref 150–400)
RBC: 4.05 MIL/uL (ref 3.87–5.11)
RDW: 12.9 % (ref 11.5–15.5)
WBC: 5.6 10*3/uL (ref 4.0–10.5)

## 2017-02-05 LAB — BASIC METABOLIC PANEL
Anion gap: 6 (ref 5–15)
BUN: 9 mg/dL (ref 6–20)
CO2: 26 mmol/L (ref 22–32)
Calcium: 9.2 mg/dL (ref 8.9–10.3)
Chloride: 106 mmol/L (ref 101–111)
Creatinine, Ser: 0.83 mg/dL (ref 0.44–1.00)
GFR calc Af Amer: >60 mL/min (ref >60)
GFR calc non Af Amer: >60 mL/min (ref >60)
Glucose, Bld: 92 mg/dL (ref 65–99)
Potassium: 4.2 mmol/L (ref 3.5–5.1)
Sodium: 138 mmol/L (ref 135–145)

## 2017-02-05 NOTE — Pre-Procedure Instructions (Signed)
Dr. Sherron AlesK. Jackson made aware of EKG no new orders received at this time.

## 2017-02-07 ENCOUNTER — Ambulatory Visit (INDEPENDENT_AMBULATORY_CARE_PROVIDER_SITE_OTHER): Payer: 59 | Admitting: Obstetrics & Gynecology

## 2017-02-07 ENCOUNTER — Encounter: Payer: Self-pay | Admitting: Obstetrics & Gynecology

## 2017-02-07 ENCOUNTER — Other Ambulatory Visit: Payer: Self-pay

## 2017-02-07 ENCOUNTER — Ambulatory Visit (INDEPENDENT_AMBULATORY_CARE_PROVIDER_SITE_OTHER): Payer: 59

## 2017-02-07 VITALS — BP 110/80 | HR 72 | Resp 14 | Wt 214.0 lb

## 2017-02-07 DIAGNOSIS — N921 Excessive and frequent menstruation with irregular cycle: Secondary | ICD-10-CM

## 2017-02-07 DIAGNOSIS — N838 Other noninflammatory disorders of ovary, fallopian tube and broad ligament: Secondary | ICD-10-CM

## 2017-02-07 DIAGNOSIS — N839 Noninflammatory disorder of ovary, fallopian tube and broad ligament, unspecified: Secondary | ICD-10-CM

## 2017-02-07 NOTE — Progress Notes (Signed)
46 y.o. [id number] Legally SeparatedAfrican American female here for repeat PUS before upcoming hysterectomy.  This is being done due to left sided abnormalities on prior PUS as well as for surgical planning.  Pt has been considering just proceeding with BSO (not at my recommendation) because she "never wants to have another gynecological surgery if at all possible".  She has been counseled about risks and benefits for ovary removal including decreased bone density,likely need for HRT and increased risks for CVD.  Pt has decided if right ovary looks normal, she would like for it to stay.  Ultrasound today: Uterus:  11 x 9 x 6cm Endometrium: 16mm Left ovary:  4.4 x 2.5 x 2.3cm with 23 x 26 x 23mm cyst with internal echoes c/w  corpus luteum.  Previous solid appearing mass not present.  Mild to moderate simple fluid with thickened septations lateral to left ovary Right ovary:  3.1 x 2.3 x 2.0cm with 1.3 x 1.0cm follicle Cul de sac:  No free fluid  Ob Hx:   Patient's last menstrual period was 01/27/2017 (exact date).          Sexually active: No. Birth control: bilateral tubal ligation Last pap: 01/08/17 normal, HR HPV negative  Last MMG: 12/04/16 BIRADS 1 negative Tobacco: Never smoker  Past Surgical History:  Procedure Laterality Date  . ADENOIDECTOMY     lymph nodes removed  . APPENDECTOMY  1990  . COLONOSCOPY    . COLPOSCOPY  1992  . TONSILLECTOMY    . TUBAL LIGATION      Past Medical History:  Diagnosis Date  . Abnormal Pap smear of cervix    dysplasia   . Anemia    history of  . Anxiety   . Asthma    seasonal asthma, has not required use of inhaler in over 2 years  . Cervical dysplasia 1993  . Depression   . Diverticulitis   . Dysmenorrhea   . Essential hypertension   . GERD (gastroesophageal reflux disease)   . Headache    history of migraines  . IBS (irritable bowel syndrome)   . STD (sexually transmitted disease) ~1998   chlamydia     Allergies: Amoxicillin and  Penicillin g  Current Outpatient Prescriptions  Medication Sig Dispense Refill  . hydrochlorothiazide (HYDRODIURIL) 25 MG tablet Take 1 tablet (25 mg total) by mouth daily. 90 tablet 1  . ibuprofen (ADVIL,MOTRIN) 200 MG tablet Take 400 mg by mouth 2 (two) times daily as needed (for pain.).    Marland Kitchen. Lifitegrast (XIIDRA OP) Place 1 application into both eyes at bedtime.     . pantoprazole (PROTONIX) 40 MG tablet Take 1 tablet (40 mg total) by mouth daily as needed. (Patient taking differently: Take 40 mg by mouth daily as needed (for heartburn/indigestion.). ) 90 tablet 1   No current facility-administered medications for this visit.     ROS: A comprehensive review of systems was negative.  Exam:    BP 110/80 (BP Location: Right Arm, Patient Position: Sitting, Cuff Size: Normal)   Pulse 72   Resp 14   Wt 214 lb (97.1 kg)   LMP 01/27/2017 (Exact Date)   BMI 38.52 kg/m   General appearance: alert and cooperative Head: Normocephalic, without obvious abnormality, atraumatic Neck: no adenopathy, supple, symmetrical, trachea midline and thyroid not enlarged, symmetric, no tenderness/mass/nodules Lungs: clear to auscultation bilaterally Heart: regular rate and rhythm, S1, S2 normal, no murmur, click, rub or gallop Abdomen: soft, non-tender; bowel sounds normal; no masses,  no organomegaly Extremities: extremities normal, atraumatic, no cyanosis or edema Skin: Skin color, texture, turgor normal. No rashes or lesions Lymph nodes: Cervical, supraclavicular, and axillary nodes normal. no inguinal nodes palpated Neurologic: Grossly normal  Pelvic: not performed today  A: menorrhagia Complex left ovarian cyst c/w corpus lute  P:  TLH/LSO/right salpingectomy, cystoscopy planned.  Pt is comfortable with leaving right ovary as long as this appears normal.  Reviewed procedure, including risks and benefits.  All questions answered.     ~15 minutes spent with patient >50% of time was in face to  face discussion of above.

## 2017-02-08 ENCOUNTER — Ambulatory Visit (AMBULATORY_SURGERY_CENTER): Payer: 59 | Admitting: Internal Medicine

## 2017-02-08 ENCOUNTER — Encounter: Payer: Self-pay | Admitting: Internal Medicine

## 2017-02-08 VITALS — BP 145/102 | HR 83 | Temp 98.0°F | Resp 17 | Ht 61.0 in | Wt 214.0 lb

## 2017-02-08 DIAGNOSIS — Z1212 Encounter for screening for malignant neoplasm of rectum: Secondary | ICD-10-CM

## 2017-02-08 DIAGNOSIS — Z1211 Encounter for screening for malignant neoplasm of colon: Secondary | ICD-10-CM | POA: Diagnosis not present

## 2017-02-08 DIAGNOSIS — R1032 Left lower quadrant pain: Secondary | ICD-10-CM | POA: Diagnosis not present

## 2017-02-08 DIAGNOSIS — Z800 Family history of malignant neoplasm of digestive organs: Secondary | ICD-10-CM

## 2017-02-08 DIAGNOSIS — K219 Gastro-esophageal reflux disease without esophagitis: Secondary | ICD-10-CM | POA: Diagnosis not present

## 2017-02-08 MED ORDER — SODIUM CHLORIDE 0.9 % IV SOLN: 500.0000 mL | INTRAVENOUS | Status: DC

## 2017-02-08 NOTE — Patient Instructions (Signed)
YOU HAD AN ENDOSCOPIC PROCEDURE TODAY AT THE [facility]:   Refer to the procedure report that was given to you for any specific questions about what was found during the examination.  If the procedure report does not answer your questions, please call your gastroenterologist to clarify.  If you requested that your care partner not be given the details of your procedure findings, then the procedure report has been included in a sealed envelope for you to review at your convenience later.  YOU SHOULD EXPECT: Some feelings of bloating in the abdomen. Passage of more gas than usual.  Walking can help get rid of the air that was put into your GI tract during the procedure and reduce the bloating. If you had a lower endoscopy (such as a colonoscopy or flexible sigmoidoscopy) you may notice spotting of blood in your stool or on the toilet paper. If you underwent a bowel prep for your procedure, you may not have a normal bowel movement for a few days.  Please Note:  You might notice some irritation and congestion in your nose or some drainage.  This is from the oxygen used during your procedure.  There is no need for concern and it should clear up in a day or so.  SYMPTOMS TO REPORT IMMEDIATELY:   Following lower endoscopy (colonoscopy or flexible sigmoidoscopy):  Excessive amounts of blood in the stool  Significant tenderness or worsening of abdominal pains  Swelling of the abdomen that is new, acute  Fever of 100F or higher   For urgent or emergent issues, a gastroenterologist can be reached at any hour by calling [phone].   DIET:  We do recommend a small meal at first, but then you may proceed to your regular diet.  Drink plenty of fluids but you should avoid alcoholic beverages for 24 hours.  ACTIVITY:  You should plan to take it easy for the rest of today and you should NOT DRIVE or use heavy machinery until tomorrow (because of the sedation medicines used during the test).     FOLLOW UP: Our staff will call the number listed on your records the next business day following your procedure to check on you and address any questions or concerns that you may have regarding the information given to you following your procedure. If we do not reach you, we will leave a message.  However, if you are feeling well and you are not experiencing any problems, there is no need to return our call.  We will assume that you have returned to your regular daily activities without incident.  If any biopsies were taken you will be contacted by phone or by letter within the next 1-3 weeks.  Please call us at [phone] if you have not heard about the biopsies in 3 weeks.    SIGNATURES/CONFIDENTIALITY: You and/or your care partner have signed paperwork which will be entered into your electronic medical record.  These signatures attest to the fact that that the information above on your After Visit Summary has been reviewed and is understood.  Full responsibility of the confidentiality of this discharge information lies with you and/or your care-partner.  Thank you for letting us take care of your healthcare needs today. 

## 2017-02-08 NOTE — Op Note (Signed)
[facility] Patient Name: Kristen [name] Procedure Date: 02/08/2017 3:29 PM MRN: [id number] Endoscopist: Beverley Fiedler , MD Age: 46 Referring MD:  Date of Birth: [birth year] Gender: Female Account #: [id number] Procedure:                Colonoscopy Indications:              Screening in patient at increased risk: Family                            history of 1st-degree relative with colorectal                            cancer before age 69 years, This is the patient's                            first colonoscopy Medicines:                Monitored Anesthesia Care Procedure:                Pre-Anesthesia Assessment:                           - Prior to the procedure, a History and Physical                            was performed, and patient medications and                            allergies were reviewed. The patient's tolerance of                            previous anesthesia was also reviewed. The risks                            and benefits of the procedure and the sedation                            options and risks were discussed with the patient.                            All questions were answered, and informed consent                            was obtained. Prior Anticoagulants: The patient has                            taken no previous anticoagulant or antiplatelet                            agents. ASA Grade Assessment: II - A patient with                            mild systemic disease. After reviewing the risks  and benefits, the patient was deemed in                            satisfactory condition to undergo the procedure.                           After obtaining informed consent, the colonoscope                            was passed under direct vision. Throughout the                            procedure, the patient's blood pressure, pulse, and                            oxygen saturations were monitored continuously.  The                            Colonoscope was introduced through the anus and                            advanced to the the cecum, identified by                            appendiceal orifice and ileocecal valve. The                            colonoscopy was performed without difficulty. The                            patient tolerated the procedure well. The quality                            of the bowel preparation was good. The ileocecal                            valve, appendiceal orifice, and rectum were                            photographed. Scope In: 3:47:49 PM Scope Out: 3:57:12 PM Scope Withdrawal Time: 0 hours 7 minutes 13 seconds  Total Procedure Duration: 0 hours 9 minutes 23 seconds  Findings:                 The perianal and digital rectal examinations were                            normal.                           The entire examined colon appeared normal on direct                            and retroflexion views. Complications:            No immediate complications. Estimated Blood Loss:  Estimated blood loss: none. Impression:               - The entire examined colon is normal on direct and                            retroflexion views.                           - No specimens collected. Recommendation:           - Patient has a contact number available for                            emergencies. The signs and symptoms of potential                            delayed complications were discussed with the                            patient. Return to normal activities tomorrow.                            Written discharge instructions were provided to the                            patient.                           - Resume previous diet.                           - Continue present medications.                           - Repeat colonoscopy in 5 years for screening                            purposes.                           - If constipation persists  after abdominal                            hysterectomy, please contact my office for a                            follow-up appointment to discuss constipation                            management. Beverley Fiedler, MD 02/08/2017 4:01:10 PM This report has been signed electronically.

## 2017-02-08 NOTE — Progress Notes (Signed)
Patient called back to admission. Patient with large bottle of water and seen sipping the water, per Janalee DaneNancy Campbell LPN. Patient stating she was anxious and just forgot. Patient stating she only sipped the water, small volume. Dr. Rhea BeltonPyrtle and [facility] Center[facility] Monday CRNA informed by Darlyn Readelia McCoy RN

## 2017-02-08 NOTE — Progress Notes (Signed)
Report to PACU, RN, vss, BBS= Clear.  

## 2017-02-11 ENCOUNTER — Telehealth: Payer: Self-pay | Admitting: *Deleted

## 2017-02-11 NOTE — Telephone Encounter (Signed)
  Follow up Call-  Call back number 02/08/2017  Post procedure Call Back phone  # [phone]  Permission to leave phone message Yes  Some recent data might be hidden     Patient questions:  Do you have a fever, pain , or abdominal swelling? No. Pain Score  0 *  Have you tolerated food without any problems? Yes.    Have you been able to return to your normal activities? Yes.    Do you have any questions about your discharge instructions: Diet   No. Medications  No. Follow up visit  No.  Do you have questions or concerns about your Care? No.  Actions: * If pain score is 4 or above: No action needed, pain <4.

## 2017-02-12 ENCOUNTER — Ambulatory Visit (HOSPITAL_COMMUNITY)
Admission: RE | Admit: 2017-02-12 | Discharge: 2017-02-13 | Disposition: A | Discharge status: Home / Self Care | Payer: 59 | Source: Ambulatory Visit | Attending: Obstetrics & Gynecology | Admitting: Obstetrics & Gynecology

## 2017-02-12 ENCOUNTER — Encounter (HOSPITAL_COMMUNITY): Payer: Self-pay

## 2017-02-12 ENCOUNTER — Ambulatory Visit (HOSPITAL_COMMUNITY): Payer: 59 | Admitting: Anesthesiology

## 2017-02-12 ENCOUNTER — Encounter (HOSPITAL_COMMUNITY)
Admission: RE | Disposition: A | Discharge status: Home / Self Care | Payer: Self-pay | Source: Ambulatory Visit | Attending: Obstetrics & Gynecology

## 2017-02-12 DIAGNOSIS — I10 Essential (primary) hypertension: Secondary | ICD-10-CM | POA: Diagnosis not present

## 2017-02-12 DIAGNOSIS — Z880 Allergy status to penicillin: Secondary | ICD-10-CM | POA: Diagnosis not present

## 2017-02-12 DIAGNOSIS — N852 Hypertrophy of uterus: Secondary | ICD-10-CM | POA: Insufficient documentation

## 2017-02-12 DIAGNOSIS — F419 Anxiety disorder, unspecified: Secondary | ICD-10-CM | POA: Diagnosis not present

## 2017-02-12 DIAGNOSIS — F329 Major depressive disorder, single episode, unspecified: Secondary | ICD-10-CM | POA: Insufficient documentation

## 2017-02-12 DIAGNOSIS — K219 Gastro-esophageal reflux disease without esophagitis: Secondary | ICD-10-CM | POA: Diagnosis not present

## 2017-02-12 DIAGNOSIS — R1032 Left lower quadrant pain: Secondary | ICD-10-CM

## 2017-02-12 DIAGNOSIS — N920 Excessive and frequent menstruation with regular cycle: Secondary | ICD-10-CM | POA: Insufficient documentation

## 2017-02-12 DIAGNOSIS — K589 Irritable bowel syndrome without diarrhea: Secondary | ICD-10-CM | POA: Diagnosis not present

## 2017-02-12 DIAGNOSIS — N921 Excessive and frequent menstruation with irregular cycle: Secondary | ICD-10-CM | POA: Diagnosis not present

## 2017-02-12 DIAGNOSIS — N736 Female pelvic peritoneal adhesions (postinfective): Secondary | ICD-10-CM | POA: Diagnosis not present

## 2017-02-12 HISTORY — PX: TOTAL LAPAROSCOPIC HYSTERECTOMY WITH SALPINGECTOMY: SHX6742

## 2017-02-12 HISTORY — PX: CYSTOSCOPY: SHX5120

## 2017-02-12 LAB — PREGNANCY, URINE: Preg Test, Ur: NEGATIVE

## 2017-02-12 SURGERY — HYSTERECTOMY, TOTAL, LAPAROSCOPIC, WITH SALPINGECTOMY
Anesthesia: General | Site: Bladder

## 2017-02-12 MED ORDER — ROCURONIUM BROMIDE 100 MG/10ML IV SOLN
INTRAVENOUS | Status: AC
  Filled 2017-02-12: qty 1

## 2017-02-12 MED ORDER — ONDANSETRON HCL 4 MG/2ML IJ SOLN
4.0000 mg | Freq: Four times a day (QID) | INTRAMUSCULAR | Status: DC | PRN
  Administered 2017-02-12: 4 mg via INTRAVENOUS
  Filled 2017-02-12: qty 2

## 2017-02-12 MED ORDER — HYDROMORPHONE HCL 1 MG/ML IJ SOLN: 0.2500 mg | INTRAMUSCULAR | Status: DC | PRN

## 2017-02-12 MED ORDER — PROPOFOL 10 MG/ML IV BOLUS
INTRAVENOUS | Status: AC
  Filled 2017-02-12: qty 20

## 2017-02-12 MED ORDER — KETOROLAC TROMETHAMINE 30 MG/ML IJ SOLN
30.0000 mg | Freq: Four times a day (QID) | INTRAMUSCULAR | Status: DC
  Administered 2017-02-12 – 2017-02-13 (×3): 30 mg via INTRAVENOUS
  Filled 2017-02-12 (×3): qty 1

## 2017-02-12 MED ORDER — HYDROMORPHONE HCL 1 MG/ML IJ SOLN
INTRAMUSCULAR | Status: AC
  Administered 2017-02-12: 0.5 mg
  Filled 2017-02-12: qty 0.5

## 2017-02-12 MED ORDER — GLYCOPYRROLATE 0.2 MG/ML IJ SOLN
INTRAMUSCULAR | Status: DC | PRN
  Administered 2017-02-12: 0.1 mg via INTRAVENOUS

## 2017-02-12 MED ORDER — LACTATED RINGERS IR SOLN
Status: DC | PRN
  Administered 2017-02-12: 3000 mL

## 2017-02-12 MED ORDER — BUPIVACAINE HCL (PF) 0.25 % IJ SOLN
INTRAMUSCULAR | Status: DC | PRN
  Administered 2017-02-12: 14 mL

## 2017-02-12 MED ORDER — SIMETHICONE 80 MG PO CHEW: 80.0000 mg | CHEWABLE_TABLET | Freq: Four times a day (QID) | ORAL | Status: DC | PRN

## 2017-02-12 MED ORDER — PROMETHAZINE HCL 25 MG/ML IJ SOLN: 12.5000 mg | INTRAMUSCULAR | Status: DC | PRN

## 2017-02-12 MED ORDER — ALUM & MAG HYDROXIDE-SIMETH 200-200-20 MG/5ML PO SUSP
30.0000 mL | ORAL | Status: DC | PRN
  Administered 2017-02-12 – 2017-02-13 (×3): 30 mL via ORAL
  Filled 2017-02-12 (×3): qty 30

## 2017-02-12 MED ORDER — PANTOPRAZOLE SODIUM 40 MG IV SOLR
40.0000 mg | Freq: Every day | INTRAVENOUS | Status: DC
  Administered 2017-02-12: 40 mg via INTRAVENOUS
  Filled 2017-02-12: qty 40

## 2017-02-12 MED ORDER — LACTATED RINGERS IV SOLN
INTRAVENOUS | Status: DC
  Administered 2017-02-12: 125 mL/h via INTRAVENOUS
  Administered 2017-02-12: 11:00:00 via INTRAVENOUS

## 2017-02-12 MED ORDER — STERILE WATER FOR IRRIGATION IR SOLN
Status: DC | PRN
  Administered 2017-02-12: 1000 mL via INTRAVESICAL

## 2017-02-12 MED ORDER — LIDOCAINE HCL (CARDIAC) 20 MG/ML IV SOLN
INTRAVENOUS | Status: AC
  Filled 2017-02-12: qty 5

## 2017-02-12 MED ORDER — SUCCINYLCHOLINE CHLORIDE 200 MG/10ML IV SOSY
PREFILLED_SYRINGE | INTRAVENOUS | Status: AC
  Filled 2017-02-12: qty 10

## 2017-02-12 MED ORDER — ROCURONIUM BROMIDE 100 MG/10ML IV SOLN
INTRAVENOUS | Status: DC | PRN
  Administered 2017-02-12: 5 mg via INTRAVENOUS
  Administered 2017-02-12: 10 mg via INTRAVENOUS
  Administered 2017-02-12: 50 mg via INTRAVENOUS
  Administered 2017-02-12: 5 mg via INTRAVENOUS

## 2017-02-12 MED ORDER — HYDROCHLOROTHIAZIDE 25 MG PO TABS
25.0000 mg | ORAL_TABLET | Freq: Every day | ORAL | Status: DC
Start: 2017-02-12 — End: 2017-02-13
  Administered 2017-02-13: 25 mg via ORAL
  Filled 2017-02-12: qty 1

## 2017-02-12 MED ORDER — PHENYLEPHRINE 40 MCG/ML (10ML) SYRINGE FOR IV PUSH (FOR BLOOD PRESSURE SUPPORT)
PREFILLED_SYRINGE | INTRAVENOUS | Status: AC
Start: 2017-02-12 — End: 2017-02-12
  Filled 2017-02-12: qty 10

## 2017-02-12 MED ORDER — KETOROLAC TROMETHAMINE 30 MG/ML IJ SOLN: 30.0000 mg | Freq: Four times a day (QID) | INTRAMUSCULAR | Status: DC

## 2017-02-12 MED ORDER — SUGAMMADEX SODIUM 200 MG/2ML IV SOLN
INTRAVENOUS | Status: DC | PRN
  Administered 2017-02-12: 200 mg via INTRAVENOUS

## 2017-02-12 MED ORDER — ENOXAPARIN SODIUM 40 MG/0.4ML ~~LOC~~ SOLN
40.0000 mg | SUBCUTANEOUS | Status: AC
  Administered 2017-02-12: 40 mg via SUBCUTANEOUS
  Filled 2017-02-12: qty 0.4

## 2017-02-12 MED ORDER — PHENYLEPHRINE 40 MCG/ML (10ML) SYRINGE FOR IV PUSH (FOR BLOOD PRESSURE SUPPORT)
PREFILLED_SYRINGE | INTRAVENOUS | Status: AC
  Filled 2017-02-12: qty 10

## 2017-02-12 MED ORDER — SUCCINYLCHOLINE CHLORIDE 20 MG/ML IJ SOLN
INTRAMUSCULAR | Status: DC | PRN
  Administered 2017-02-12: 120 mg via INTRAVENOUS

## 2017-02-12 MED ORDER — ROPIVACAINE HCL 5 MG/ML IJ SOLN
INTRAMUSCULAR | Status: AC
  Filled 2017-02-12: qty 30

## 2017-02-12 MED ORDER — MIDAZOLAM HCL 2 MG/2ML IJ SOLN
INTRAMUSCULAR | Status: DC | PRN
  Administered 2017-02-12: 2 mg via INTRAVENOUS

## 2017-02-12 MED ORDER — FENTANYL CITRATE (PF) 100 MCG/2ML IJ SOLN
INTRAMUSCULAR | Status: DC | PRN
  Administered 2017-02-12 (×5): 50 ug via INTRAVENOUS

## 2017-02-12 MED ORDER — MENTHOL 3 MG MT LOZG: 1.0000 | LOZENGE | OROMUCOSAL | Status: DC | PRN

## 2017-02-12 MED ORDER — ONDANSETRON HCL 4 MG/2ML IJ SOLN
INTRAMUSCULAR | Status: AC
  Filled 2017-02-12: qty 2

## 2017-02-12 MED ORDER — ONDANSETRON HCL 4 MG/2ML IJ SOLN: 4.0000 mg | Freq: Four times a day (QID) | INTRAMUSCULAR | Status: DC | PRN

## 2017-02-12 MED ORDER — OXYCODONE HCL 5 MG/5ML PO SOLN: 5.0000 mg | Freq: Once | ORAL | Status: DC | PRN

## 2017-02-12 MED ORDER — PHENYLEPHRINE HCL 10 MG/ML IJ SOLN
INTRAMUSCULAR | Status: DC | PRN
  Administered 2017-02-12: 80 ug via INTRAVENOUS

## 2017-02-12 MED ORDER — SCOPOLAMINE 1 MG/3DAYS TD PT72
1.0000 | MEDICATED_PATCH | Freq: Once | TRANSDERMAL | Status: DC
  Administered 2017-02-12: 1.5 mg via TRANSDERMAL

## 2017-02-12 MED ORDER — FENTANYL CITRATE (PF) 250 MCG/5ML IJ SOLN
INTRAMUSCULAR | Status: AC
  Filled 2017-02-12: qty 5

## 2017-02-12 MED ORDER — SCOPOLAMINE 1 MG/3DAYS TD PT72
MEDICATED_PATCH | TRANSDERMAL | Status: AC
  Administered 2017-02-12: 1.5 mg via TRANSDERMAL
  Filled 2017-02-12: qty 1

## 2017-02-12 MED ORDER — HYDROMORPHONE HCL 1 MG/ML IJ SOLN
INTRAMUSCULAR | Status: DC | PRN
  Administered 2017-02-12: 1 mg via INTRAVENOUS

## 2017-02-12 MED ORDER — MORPHINE SULFATE (PF) 4 MG/ML IV SOLN: 1.0000 mg | INTRAVENOUS | Status: DC | PRN

## 2017-02-12 MED ORDER — DEXTROSE-NACL 5-0.45 % IV SOLN
INTRAVENOUS | Status: DC
  Administered 2017-02-12: 18:00:00 via INTRAVENOUS

## 2017-02-12 MED ORDER — DEXAMETHASONE SODIUM PHOSPHATE 10 MG/ML IJ SOLN
INTRAMUSCULAR | Status: AC
  Filled 2017-02-12: qty 1

## 2017-02-12 MED ORDER — PROPOFOL 10 MG/ML IV BOLUS
INTRAVENOUS | Status: DC | PRN
  Administered 2017-02-12: 200 mg via INTRAVENOUS

## 2017-02-12 MED ORDER — SODIUM CHLORIDE 0.9 % IJ SOLN
INTRAMUSCULAR | Status: AC
  Filled 2017-02-12: qty 50

## 2017-02-12 MED ORDER — KETOROLAC TROMETHAMINE 30 MG/ML IJ SOLN
INTRAMUSCULAR | Status: DC | PRN
  Administered 2017-02-12: 30 mg via INTRAVENOUS

## 2017-02-12 MED ORDER — OXYCODONE-ACETAMINOPHEN 5-325 MG PO TABS: 1.0000 | ORAL_TABLET | ORAL | Status: DC | PRN

## 2017-02-12 MED ORDER — OXYCODONE HCL 5 MG PO TABS: 5.0000 mg | ORAL_TABLET | Freq: Once | ORAL | Status: DC | PRN

## 2017-02-12 MED ORDER — METRONIDAZOLE IN NACL 5-0.79 MG/ML-% IV SOLN
500.0000 mg | INTRAVENOUS | Status: AC
  Administered 2017-02-12: 500 mg via INTRAVENOUS
  Filled 2017-02-12: qty 100

## 2017-02-12 MED ORDER — DEXTROSE 5 % IV SOLN
5.0000 mg/kg | INTRAVENOUS | Status: AC
  Administered 2017-02-12: 340 mg via INTRAVENOUS
  Filled 2017-02-12: qty 8.5

## 2017-02-12 MED ORDER — SODIUM CHLORIDE 0.9 % IJ SOLN
INTRAMUSCULAR | Status: DC | PRN
  Administered 2017-02-12: 10 mL

## 2017-02-12 MED ORDER — ACETAMINOPHEN 325 MG PO TABS
650.0000 mg | ORAL_TABLET | ORAL | Status: DC | PRN
  Administered 2017-02-12 – 2017-02-13 (×3): 650 mg via ORAL
  Filled 2017-02-12 (×3): qty 2

## 2017-02-12 MED ORDER — MIDAZOLAM HCL 2 MG/2ML IJ SOLN
INTRAMUSCULAR | Status: AC
  Filled 2017-02-12: qty 2

## 2017-02-12 MED ORDER — HYDROMORPHONE HCL 1 MG/ML IJ SOLN
INTRAMUSCULAR | Status: AC
  Filled 2017-02-12: qty 1

## 2017-02-12 MED ORDER — LIDOCAINE HCL (CARDIAC) 20 MG/ML IV SOLN
INTRAVENOUS | Status: DC | PRN
  Administered 2017-02-12: 80 mg via INTRAVENOUS

## 2017-02-12 MED ORDER — BUPIVACAINE HCL (PF) 0.25 % IJ SOLN
INTRAMUSCULAR | Status: AC
  Filled 2017-02-12: qty 30

## 2017-02-12 SURGICAL SUPPLY — 51 items
APL SRG 38 LTWT LNG FL B (MISCELLANEOUS) ×3
APPLICATOR ARISTA FLEXITIP XL (MISCELLANEOUS) ×1 IMPLANT
CABLE HIGH FREQUENCY MONO STRZ (ELECTRODE) ×1 IMPLANT
CLOTH BEACON ORANGE TIMEOUT ST (SAFETY) ×4 IMPLANT
COVER LIGHT HANDLE  1/PK (MISCELLANEOUS) ×1
COVER LIGHT HANDLE 1/PK (MISCELLANEOUS) ×3 IMPLANT
COVER MAYO STAND STRL (DRAPES) ×4 IMPLANT
DURAPREP 26ML APPLICATOR (WOUND CARE) ×4 IMPLANT
GLOVE BIOGEL PI IND STRL 7.0 (GLOVE) ×15 IMPLANT
GLOVE BIOGEL PI INDICATOR 7.0 (GLOVE) ×5
GLOVE ECLIPSE 6.5 STRL STRAW (GLOVE) ×12 IMPLANT
GOWN STRL REUS W/TWL LRG LVL3 (GOWN DISPOSABLE) ×16 IMPLANT
HEMOSTAT ARISTA ABSORB 3G PWDR (MISCELLANEOUS) ×1 IMPLANT
LEGGING LITHOTOMY PAIR STRL (DRAPES) ×2 IMPLANT
LIGASURE VESSEL 5MM BLUNT TIP (ELECTROSURGICAL) ×4 IMPLANT
MANIPULATOR ADVINCU DEL 3.5 PL (MISCELLANEOUS) ×1 IMPLANT
NEEDLE INSUFFLATION 120MM (ENDOMECHANICALS) ×4 IMPLANT
NS IRRIG 1000ML POUR BTL (IV SOLUTION) ×4 IMPLANT
OCCLUDER COLPOPNEUMO (BALLOONS) ×4 IMPLANT
PACK LAPAROSCOPY BASIN (CUSTOM PROCEDURE TRAY) ×4 IMPLANT
PACK TRENDGUARD 450 HYBRID PRO (MISCELLANEOUS) IMPLANT
PACK TRENDGUARD 600 HYBRD PROC (MISCELLANEOUS) IMPLANT
POUCH LAPAROSCOPIC INSTRUMENT (MISCELLANEOUS) ×4 IMPLANT
PROTECTOR NERVE ULNAR (MISCELLANEOUS) ×8 IMPLANT
SCISSORS LAP 5X35 DISP (ENDOMECHANICALS) ×4 IMPLANT
SET CYSTO W/LG BORE CLAMP LF (SET/KITS/TRAYS/PACK) ×1 IMPLANT
SET IRRIG TUBING LAPAROSCOPIC (IRRIGATION / IRRIGATOR) ×4 IMPLANT
SET TRI-LUMEN FLTR TB AIRSEAL (TUBING) ×4 IMPLANT
SHEARS HARMONIC ACE PLUS 36CM (ENDOMECHANICALS) ×1 IMPLANT
SOLUTION ELECTROLUBE (MISCELLANEOUS) ×3 IMPLANT
SUT VIC AB 0 CT1 27 (SUTURE) ×8
SUT VIC AB 0 CT1 27XBRD ANBCTR (SUTURE) ×6 IMPLANT
SUT VICRYL 0 UR6 27IN ABS (SUTURE) ×4 IMPLANT
SUT VICRYL 4-0 PS2 18IN ABS (SUTURE) ×5 IMPLANT
SUT VLOC 180 0 9IN  GS21 (SUTURE) ×1
SUT VLOC 180 0 9IN GS21 (SUTURE) ×3 IMPLANT
SYR 50ML LL SCALE MARK (SYRINGE) ×8 IMPLANT
SYRINGE 10CC LL (SYRINGE) ×4 IMPLANT
SYSTEM CARTER THOMASON II (TROCAR) IMPLANT
TIP UTERINE 5.1X6CM LAV DISP (MISCELLANEOUS) IMPLANT
TIP UTERINE 6.7X10CM GRN DISP (MISCELLANEOUS) ×1 IMPLANT
TIP UTERINE 6.7X6CM WHT DISP (MISCELLANEOUS) IMPLANT
TIP UTERINE 6.7X8CM BLUE DISP (MISCELLANEOUS) IMPLANT
TOWEL OR 17X24 6PK STRL BLUE (TOWEL DISPOSABLE) ×8 IMPLANT
TRENDGUARD 450 HYBRID PRO PACK (MISCELLANEOUS) ×4
TRENDGUARD 600 HYBRID PROC PK (MISCELLANEOUS)
TROCAR ADV FIXATION 5X100MM (TROCAR) ×4 IMPLANT
TROCAR PORT AIRSEAL 5X120 (TROCAR) ×4 IMPLANT
TROCAR XCEL NON BLADE 8MM B8LT (ENDOMECHANICALS) ×4 IMPLANT
TROCAR XCEL NON-BLD 5MMX100MML (ENDOMECHANICALS) ×4 IMPLANT
WARMER LAPAROSCOPE (MISCELLANEOUS) ×4 IMPLANT

## 2017-02-12 NOTE — OR Nursing (Signed)
Updated family by phone per Dr. Hyacinth MeekerMiller at  [phone]

## 2017-02-12 NOTE — Op Note (Addendum)
02/12/2017  2:23 PM  PATIENT:  Kristen [name]  46 y.o. female  PRE-OPERATIVE DIAGNOSIS:  menorrhagia, enlarged uterus, LLQ pain  POST-OPERATIVE DIAGNOSIS:  menorrhagia, enlarged uterus, LLQ pain, adhesions of left ovary to left sidewall and vaginal cuff  PROCEDURE:  Procedure(s): HYSTERECTOMY TOTAL LAPAROSCOPIC WITH SALPINGECTOMY CYSTOSCOPY LAPAROSCOPIC OOPHORECTOMY  SURGEON:  Clark Cuff SUZANNE  ASSISTANTS: Gertie Exon, MD   ANESTHESIA:   general  ESTIMATED BLOOD LOSS: 150cc  BLOOD ADMINISTERED:none   FLUIDS: 2300cc LF  UOP: 550 cc clear uOPD  SPECIMEN:  Uterus, left fallopian tube and ovary, right fallopian tube  DISPOSITION OF SPECIMEN:  PATHOLOGY  FINDINGS: large, bulky, heavy uterus.  Surgical weight:  341gm.  Uterus fills pelvis making visualization more difficult  DESCRIPTION OF OPERATION: Patient is taken to the operating room. She is placed in the supine position. She is a running IV in place. Informed consent was present on the chart. SCDs on her lower extremities and functioning properly.  Her legs are placed in the low lithotomy position in [place] stirrups. Her arms were tucked by the side.  This was done while she was awake to ensure there was no pressure areas or pain with positioning.  General endotracheal anesthesia was administered by the anesthesia staff without difficulty. Adequate anesthesia was confirmed.    Dura prep was then used to prep the abdomen and Betadine was used to prep the inner thighs, perineum and vagina. Once 3 minutes had past the patient was draped in a normal standard fashion. The legs were lifted to the high lithotomy position. The cervix was visualized by placing a heavy weighted speculum in the posterior aspect of the vagina and using a curved Deaver retractor to the retract anteriorly. The anterior lip of the cervix was grasped with single-tooth tenaculum.  The cervix sounded to 10 cm. Pratt dilators were used to dilate the cervix up  to a #21. A RUMI uterine manipulator was obtained. A #10 disposable tip was placed on the RUMI manipulator as well as a 3.5, silver KOH ring. This was passed through the cervix and the bulb of the disposable tip was inflated with 10 cc of normal saline. There was a good fit of the KOH ring around the cervix. The tenaculum was removed. There is also good manipulation of the uterus. The speculum and retractor were removed as well. A Foley catheter was placed to straight drain. The concentrated urine was noted. Legs were lowered to the low lithotomy position and attention was turned the abdomen.  The umbilicus was everted.  A Veress needle was obtained. Syringe of sterile saline was placed on a open Veress needle.  This was passed into the umbilicus until just when the fluid started to drip.  Then low flow CO2 gas was attached the needle and the pneumoperitoneum was achieved without difficulty. Once four liters of gas was in the abdomen the Veress needle was removed and a 5 millimeter non-bladed Optiview trocar and port were passed directly to the abdomen. The laparoscope was then used to confirm intraperitoneal placement. A large bulky uterus was noted.  The left ovary was adhered to the cul de sac and left sidewall.  Right ovary was normal.  H/O prior tubal ligation was noted with portions of each tube missing.  Locations for RLQ and LLQ ports were noted by transillumination of the abdominal wall.  0.25% marcaine was used to anesthetize the skin.  5mm skin incision was made on the left and a 5mm trochar was passed under  direct visualization.  8mm skin incision was made on the left an a 5mm Airseal port was placed under direct visualization of the laparoscope.  Finally a midline incision was made about 4 cm above the pubic symphysis.  The skin was incised about 8mm and a non-bladed 8mm port was placed with direct visualization of the laparoscope.    Ureters was identified on the right.  Attention was turned to the  right side.  Manipulation of the uterus was difficult due to the size of the uterus compared with the shape of the pelvis.  With uterus on stretch to the left, the right tube was excised off the ovary and mesosalpinx was dissected to free the tube. Then the left utero-ovarian pedicle was serially clamped cauterized and incised using the ligasure device. Right round ligament was serially clamped cauterized and incised. The anterior and posterior peritoneum of the inferior leaf of the broad ligament were opened. The beginning of the baldder flap was created.  The bladder was taken down below the level of the KOH ring. The right uterine artery was skeletonized and then just superior to the KOH ring this vessel was serially clamped, cauterized, and incised.  Due to poor manipulation of the uterus, decision was made to change from the Rumi device to the Advincula uterine manipulator.  Attention was turned to the vagina.  The bulb in the endometrial cavity was deflated. The Rumi maniuplator and KOH ring was removed.  Heavy weighted speculum was placed back in the vagina.  The 3.5 Advincula manipulator was obtained and passed to the fundus of the uterus.  The bulb was inflated.  Blue KOH ring was passed over cervix for good fit.  Speculum was removed.  There was better manipulation with this device however it was still difficult due to the size of the uterus in relation to the side of the pt's pelvis.    Then, attention was turned the left side.  The uterus was placed on stretch to the opposite side.  The ureter was finally seen after watching for an extended period of time.  The left IP ligament was serially clamped, cauterized and incised using the Ligasure device.  The left round ligament was serially clamped, cauterized and incised.  The ovary was adhered to the pelvis so decision was made to separate this from the uterus for better visualization.  The utero-ovarian ligament was serially clamped, cauterized and  incised.  Then the anterior and posterior peritoneum was opened.  The remainder of the bladder flap was created anteriorly.  The bladder was well below the level of the KOH ring at this point.  Then the left uterine artery was skeletonized.  Then the left uterine artery, above the level of the KOH ring, was serially clamped cauterized and incised. The uterus was not fully devascualrized at this point to attention was turned back to the right side.  With the bladder flap dissection completed from the left side, an area of vessel more medial and anterior to the uterine artery was noted.  This was serially clamped, cauterized and incised and the the uterus was devascularized at this point.  The colpotomy was performed a starting in the midline and using a monopolar scissors with an open blade.  This was carried around a circumferential fashion until the vaginal mucosa was completely incised in the specimen was freed.  The specimen was then delivered into the vagina.  A vaginal occlusive device was used to maintain the pneumoperitoneum.  At this point,  the ovary was lifted and the adhesions were taking down sharply, staying very close to the ovary.  This was well away from the sidewall to there was no concern about being near the ureter.  This was passed into the vagina and handed off as well.    Instruments were changed with a needle driver and Kobra graspers.  Using a 9 inch V. lock suture, the cuff was closed by incorporating the anterior and posterior vaginal mucosa in each stitch. This was carried across all the way to the left corner and a running fashion. Two stitches were brought back towards the midline and the suture was cut flush with the vagina. The needle was brought out the pelvis. The pelvis was irrigated. All pedicles were inspected. No bleeding was noted. Arista was placed along the pedicles in the pelvis.  At this point the procedure was completed. The LLQ and suprapubic ports were removed.  The  pneumoperitoneum was relived.  The remaining ports were removed under direct visualization of the laparoscope and the pneumoperitoneum was relieved.  The patient was taken out of Trendelenburg positioning.  Several deep breaths were given to the patient's trying to any gas the abdomen and finally the midline port was removed.  The skin was then closed with subcuticular stitches of 3-0 Vicryl. The skin was cleansed Dermabond was applied. Attention was then turned the vagina and the cuff was inspected. No bleeding was noted. There was an area about 1cm wide where the anterior peritoneum was not incorporated into the stitch.  This was not bleeding.  Decision made to not try and stitch this.   Foley was removed.  Cystoscopy was performed.  Intact bladder mucosa with normal ureteral jets from each ureteral orifice were noted.  No sutures or bladder injuries were noted.  Foley was left out.  Cystoscopic fluid was drained.  Sponge, lap, needle, initially counts were correct x2. Patient tolerated the procedure very well. She was awakened from anesthesia, extubated and taken to recovery in stable condition.   Total surgical time from incision to case completion was 3hr and 40 minutes.  This was about 1 hr and 4 minutes longer than typical TLH.  COUNTS:  YES  PLAN OF CARE: Transfer to PACU

## 2017-02-12 NOTE — Progress Notes (Signed)
Day of Surgery Procedure(s) (LRB): HYSTERECTOMY TOTAL LAPAROSCOPIC WITH SALPINGECTOMY (Bilateral) CYSTOSCOPY (N/A) LAPAROSCOPIC OOPHORECTOMY (Left)  Subjective: Patient reports no nausea.  Did have one episode of emesis earlier.  Good pain control.  Feels like she is hungry now.   Objective: I have reviewed patient's vital signs, intake and output, medications and labs. Vitals:   02/12/17 1600 02/12/17 1615 02/12/17 1700 02/12/17 1745  BP: (!) 118/97 (!) 135/91 126/76 110/65  Pulse: 79 75 71 73  Resp: 13 15  16   Temp:    98 F (36.7 C)  TempSrc:    Oral  SpO2: 100% 98%  98%     General: alert and cooperative Resp: clear to auscultation bilaterally Cardio: regular rate and rhythm, S1, S2 normal, no murmur, click, rub or gallop GI: incision: clean, dry and intact and soft, non-distended, quiet Extremities: extremities normal, atraumatic, no cyanosis or edema and SCDs in place Vaginal Bleeding: none  Assessment: s/p Procedure(s): HYSTERECTOMY TOTAL LAPAROSCOPIC WITH SALPINGECTOMY (Bilateral) CYSTOSCOPY (N/A) LAPAROSCOPIC OOPHORECTOMY (Left): stable  Plan: Encourage ambulation  Advance diet as tolerated Continue SCDs Anti-nausea medication Kpad ordered BMP and CBC in am  LOS: 0 days    Annamaria BootsMILLER, Aloysius Heinle SUZANNE 02/12/2017, 6:39 PM

## 2017-02-12 NOTE — Transfer of Care (Signed)
Immediate Anesthesia Transfer of Care Note  Patient: Kristen [name]  Procedure(s) Performed: Procedure(s): HYSTERECTOMY TOTAL LAPAROSCOPIC WITH SALPINGECTOMY (Bilateral) CYSTOSCOPY (N/A) LAPAROSCOPIC OOPHORECTOMY (Left)  Patient Location: PACU  Anesthesia Type:General  Level of Consciousness: awake, alert  and oriented  Airway & Oxygen Therapy: Patient Spontanous Breathing and Patient connected to nasal cannula oxygen  Post-op Assessment: Report given to RN and Post -op Vital signs reviewed and stable  Post vital signs: Reviewed and stable  Last Vitals:  Vitals:   02/12/17 0839  BP: (!) 143/103  Pulse: 73  Resp: 16  Temp: 36.7 C    Last Pain:  Vitals:   02/12/17 0839  TempSrc: Oral      Patients Stated Pain Goal: 5 (02/12/17 0839)  Complications: No apparent anesthesia complications

## 2017-02-12 NOTE — Anesthesia Procedure Notes (Signed)
Procedure Name: Intubation Date/Time: 02/12/2017 10:02 AM Performed by: Graciela HusbandsFUSSELL, Jahking Lesser O Pre-anesthesia Checklist: Patient identified, Emergency Drugs available, Patient being monitored, Suction available and Timeout performed Patient Re-evaluated:Patient Re-evaluated prior to inductionOxygen Delivery Method: Circle system utilized Preoxygenation: Pre-oxygenation with 100% oxygen Intubation Type: IV induction Ventilation: Oral airway inserted - appropriate to patient size and Mask ventilation without difficulty Laryngoscope Size: Glidescope and 3 (LO-PRO-3) Grade View: Grade I Tube type: Oral Tube size: 7.0 mm Number of attempts: 1 Airway Equipment and Method: Stylet and Video-laryngoscopy (Elective Glide Scope intubation on MO patient with large volume of braided hair that made positioning the patient suboptimal for intubation.) Placement Confirmation: ETT inserted through vocal cords under direct vision,  positive ETCO2 and breath sounds checked- equal and bilateral Secured at: 21.5 cm Tube secured with: Tape Dental Injury: Teeth and Oropharynx as per pre-operative assessment  Difficulty Due To: Difficulty was anticipated and Difficult Airway- due to reduced neck mobility

## 2017-02-12 NOTE — H&P (Signed)
Kristen [name] is an 46 y.o. female [id number] Separated AAF here for definitive treatment of menorrhagia and LLQ pain.  Ultrasound has shown enlarged uterus measuring 11 x 9 x 6cm.  Left ovary had a complex cyst that did have a lateral solid component but this portion resolved on ultrasound done last week.  Pt has been counseled about options/alterantives but she is desirous of definitive treatment with hysterectomy.  Risks and benefits have been reviewed.  Pt is ready to proceed.  Pertinent Gynecological History: Menses: regular Bleeding: menorrhagia Contraception: tubal ligation DES exposure: denies Blood transfusions: none Sexually transmitted diseases: past history: chlamydia ~1998 Previous GYN Procedures: none  Last mammogram: normal Date: 12/04/16 Last pap: normal Date: 01/08/17 OB History: G4, P3   Menstrual History: Patient's last menstrual period was 01/27/2017 (exact date).    Past Medical History:  Diagnosis Date  . Abnormal Pap smear of cervix    dysplasia   . Anemia    history of  . Anxiety   . Asthma    seasonal asthma, has not required use of inhaler in over 2 years  . Cervical dysplasia 1993  . Depression   . Diverticulitis   . Dysmenorrhea   . Essential hypertension   . GERD (gastroesophageal reflux disease)   . Headache    history of migraines  . IBS (irritable bowel syndrome)   . STD (sexually transmitted disease) ~1998   chlamydia     Past Surgical History:  Procedure Laterality Date  . ADENOIDECTOMY     lymph nodes removed  . APPENDECTOMY  1990  . COLONOSCOPY    . COLPOSCOPY  1992  . TONSILLECTOMY    . TUBAL LIGATION      Family History  Problem Relation Age of Onset  . Hypertension Mother   . Depression Mother   . Diabetes Maternal Grandmother   . Hypertension Maternal Grandmother   . Colon cancer Father [id number]  . Diabetes Brother   . Stomach cancer Neg Hx     Social History:  reports that she has never smoked. She has never used smokeless  tobacco. She reports that she does not drink alcohol or use drugs.  Allergies:  Allergies  Allergen Reactions  . Amoxicillin Nausea Only    Has patient had a PCN reaction causing immediate rash, facial/tongue/throat swelling, SOB or lightheadedness with hypotension: No Has patient had a PCN reaction causing severe rash involving mucus membranes or skin necrosis: No Has patient had a PCN reaction that required hospitalization: No Has patient had a PCN reaction occurring within the last 10 years: Yes If all of the above answers are "NO", then may proceed with Cephalosporin use.   Marland Kitchen. Penicillin G Rash    Has patient had a PCN reaction causing immediate rash, facial/tongue/throat swelling, SOB or lightheadedness with hypotension: Unknown Has patient had a PCN reaction causing severe rash involving mucus membranes or skin necrosis: Unknown Has patient had a PCN reaction that required hospitalization: Unknown Has patient had a PCN reaction occurring within the last 10 years: No If all of the above answers are "NO", then may proceed with Cephalosporin use.     No prescriptions prior to admission.    Review of Systems  All other systems reviewed and are negative.   Last menstrual period 01/27/2017. Physical Exam  Constitutional: She is oriented to person, place, and time. She appears well-developed and well-nourished.  Cardiovascular: Normal rate and regular rhythm.   Respiratory: Effort normal and breath sounds normal.  Neurological:  She is alert and oriented to person, place, and time.  Skin: Skin is warm and dry.  Psychiatric: She has a normal mood and affect.    No results found for this or any previous visit (from the past 24 hour(s)).  No results found.  Assessment/Plan: 46 yo G4P3A1 separate AAF female with menorrhagia here for definitive treatment with TLH/LSO/right salpingectomy/cystoscopy.  Procedure reviewed.  Questions answered.  Pt here and ready to proceed.     [name] 02/12/2017, 1:06 AM

## 2017-02-12 NOTE — Anesthesia Preprocedure Evaluation (Signed)
Anesthesia Evaluation  Patient identified by MRN, date of birth, ID band Patient awake    Reviewed: Allergy & Precautions, H&P , NPO status , Patient's Chart, lab work & pertinent test results  Airway Mallampati: II   Neck ROM: full    Dental   Pulmonary asthma ,    breath sounds clear to auscultation       Cardiovascular hypertension,  Rhythm:regular Rate:Normal     Neuro/Psych  Headaches, PSYCHIATRIC DISORDERS Anxiety Depression    GI/Hepatic GERD  ,  Endo/Other    Renal/GU      Musculoskeletal   Abdominal   Peds  Hematology   Anesthesia Other Findings   Reproductive/Obstetrics                             Anesthesia Physical Anesthesia Plan  ASA: II  Anesthesia Plan: General   Post-op Pain Management:    Induction: Intravenous  PONV Risk Score and Plan: 4 or greater and Ondansetron, Dexamethasone, Propofol, Midazolam, Scopolamine patch - Pre-op and Treatment may vary due to age or medical condition  Airway Management Planned: Oral ETT  Additional Equipment:   Intra-op Plan:   Post-operative Plan: Extubation in OR  Informed Consent: I have reviewed the patients History and Physical, chart, labs and discussed the procedure including the risks, benefits and alternatives for the proposed anesthesia with the patient or authorized representative who has indicated his/her understanding and acceptance.     Plan Discussed with: CRNA, Anesthesiologist and Surgeon  Anesthesia Plan Comments:         Anesthesia Quick Evaluation

## 2017-02-13 ENCOUNTER — Encounter (HOSPITAL_COMMUNITY): Payer: Self-pay | Admitting: Obstetrics & Gynecology

## 2017-02-13 DIAGNOSIS — F329 Major depressive disorder, single episode, unspecified: Secondary | ICD-10-CM | POA: Diagnosis not present

## 2017-02-13 DIAGNOSIS — N920 Excessive and frequent menstruation with regular cycle: Secondary | ICD-10-CM | POA: Diagnosis not present

## 2017-02-13 DIAGNOSIS — I10 Essential (primary) hypertension: Secondary | ICD-10-CM | POA: Diagnosis not present

## 2017-02-13 DIAGNOSIS — K219 Gastro-esophageal reflux disease without esophagitis: Secondary | ICD-10-CM | POA: Diagnosis not present

## 2017-02-13 DIAGNOSIS — N852 Hypertrophy of uterus: Secondary | ICD-10-CM | POA: Diagnosis not present

## 2017-02-13 DIAGNOSIS — K589 Irritable bowel syndrome without diarrhea: Secondary | ICD-10-CM | POA: Diagnosis not present

## 2017-02-13 DIAGNOSIS — Z880 Allergy status to penicillin: Secondary | ICD-10-CM | POA: Diagnosis not present

## 2017-02-13 LAB — CBC
HCT: 35.4 % — ABNORMAL LOW (ref 36.0–46.0)
Hemoglobin: 11.8 g/dL — ABNORMAL LOW (ref 12.0–15.0)
MCH: 31.9 pg (ref 26.0–34.0)
MCHC: 33.3 g/dL (ref 30.0–36.0)
MCV: 95.7 fL (ref 78.0–100.0)
Platelets: 338 10*3/uL (ref 150–400)
RBC: 3.7 MIL/uL — ABNORMAL LOW (ref 3.87–5.11)
RDW: 13.1 % (ref 11.5–15.5)
WBC: 14.1 10*3/uL — ABNORMAL HIGH (ref 4.0–10.5)

## 2017-02-13 LAB — BASIC METABOLIC PANEL
Anion gap: 7 (ref 5–15)
BUN: 9 mg/dL (ref 6–20)
CO2: 23 mmol/L (ref 22–32)
Calcium: 8.8 mg/dL — ABNORMAL LOW (ref 8.9–10.3)
Chloride: 105 mmol/L (ref 101–111)
Creatinine, Ser: 0.86 mg/dL (ref 0.44–1.00)
GFR calc Af Amer: >60 mL/min (ref >60)
GFR calc non Af Amer: >60 mL/min (ref >60)
Glucose, Bld: 113 mg/dL — ABNORMAL HIGH (ref 65–99)
Potassium: 4.1 mmol/L (ref 3.5–5.1)
Sodium: 135 mmol/L (ref 135–145)

## 2017-02-13 MED ORDER — HYDROCODONE-ACETAMINOPHEN 5-325 MG PO TABS: 1.0000 | ORAL_TABLET | Freq: Four times a day (QID) | ORAL | 0 refills | Status: DC | PRN

## 2017-02-13 MED ORDER — IBUPROFEN 800 MG PO TABS: 800.0000 mg | ORAL_TABLET | Freq: Three times a day (TID) | ORAL | 0 refills | Status: DC | PRN

## 2017-02-13 NOTE — Discharge Instructions (Signed)
Post Op Hysterectomy Instructions Please read the instructions below. Refer to these instructions for the next few weeks. These instructions provide you with general information on caring for yourself after surgery. Your caregiver may also give you specific instructions. While your treatment has been planned according to the most current medical practices available, unavoidable problems sometimes happen. If you have any problems or questions after you leave, please call your caregiver.  HOME CARE INSTRUCTIONS Healing will take time. You will have discomfort, tenderness, swelling and bruising at the operative site for a couple of weeks. This is normal and will get better as time goes on.   Only take over-the-counter or prescription medicines for pain, discomfort or fever as directed by your caregiver.   Do not take aspirin. It can cause bleeding.   Do not drive when taking pain medication.   Follow your caregivers advice regarding diet, exercise, lifting, driving and general activities.   Resume your usual diet as directed and allowed.   Get plenty of rest and sleep.   Do not douche, use tampons, or have sexual intercourse until your caregiver gives you permission. .   Take your temperature if you feel hot or flushed.   You may shower today when you get home.  No tub bath for one week.    Do not drink alcohol until you are not taking any narcotic pain medications.   Try to have someone home with you for a week or two to help with the household activities.   Be careful over the next two to three weeks with any activities at home that involve lifting, pushing, or pulling.  Listen to your body--if something feels uncomfortable to do, then don't do it.  Make sure you and your family understands everything about your operation and recovery.   Walking up stairs is fine.  Do not sign any legal documents until you feel normal again.   Keep all your follow-up appointments as recommended by  your caregiver.   PLEASE CALL THE OFFICE IF:  There is swelling, redness or increasing pain in the wound area.   Pus is coming from the wound.   You notice a bad smell from the wound or surgical dressing.   You have pain, redness and swelling from the intravenous site.   The wound is breaking open (the edges are not staying together).    You develop pain or bleeding when you urinate.   You develop abnormal vaginal discharge.   You have any type of abnormal reaction or develop an allergy to your medication.   You need stronger pain medication for your pain   SEEK IMMEDIATE MEDICAL CARE:  You develop a temperature of 100.5 or higher.   You develop abdominal pain.   You develop chest pain.   You develop shortness of breath.   You pass out.   You develop pain, swelling or redness of your leg.   You develop heavy vaginal bleeding with or without blood clots.   MEDICATIONS:  Restart your regular medications BUT wait one week before restarting all vitamins and mineral supplements  Use Motrin 800mg  every 8 hours for the next several days.  This will help you use less Percocet.  Use the Percocet 5/325 1-2 tabs every 4-6 hours as needed for pain.  You may use an over the counter stool softener like Colace or Dulcolax to help with starting a bowel movement.  Start this on Friday if you haven't had a bowel movement.  Magnesium citrate  is also fin .  Warm liquids, fluids, and ambulation help too.  If you have not had a bowel movement in four days, you need to call the office.

## 2017-02-13 NOTE — Plan of Care (Signed)
Problem: Bowel/Gastric: Goal: Will not experience complications related to bowel motility Outcome: Progressing Encouraged ambulation, warm fluids.

## 2017-02-13 NOTE — Anesthesia Postprocedure Evaluation (Signed)
Anesthesia Post Note  Patient: Kristen [name]  Procedure(s) Performed: Procedure(s) (LRB): HYSTERECTOMY TOTAL LAPAROSCOPIC WITH SALPINGECTOMY (Bilateral) CYSTOSCOPY (N/A) LAPAROSCOPIC OOPHORECTOMY (Left)     Patient location during evaluation: [facility] Anesthesia Type: General Level of consciousness: awake and alert Pain management: pain level controlled Vital Signs Assessment: post-procedure vital signs reviewed and stable Respiratory status: spontaneous breathing, nonlabored ventilation, respiratory function stable and patient connected to nasal cannula oxygen Cardiovascular status: blood pressure returned to baseline and stable Postop Assessment: no signs of nausea or vomiting Anesthetic complications: no    Last Vitals:  Vitals:   02/13/17 0030 02/13/17 0440  BP: 128/67 108/68  Pulse: 97 82  Resp: 18 18  Temp: 37 C 36.7 C    Last Pain:  Vitals:   02/13/17 0647  TempSrc:   PainSc: Asleep   Pain Goal: Patients Stated Pain Goal: 3 (02/13/17 0610)               Marrion CoyMERRITT,Maysen Bonsignore

## 2017-02-13 NOTE — Progress Notes (Signed)
Discharge instructions reviewed with patient.  Medication prescription given to patient.  Questions answered.

## 2017-02-13 NOTE — Progress Notes (Signed)
1 Day Post-Op Procedure(s) (LRB): HYSTERECTOMY TOTAL LAPAROSCOPIC WITH SALPINGECTOMY (Bilateral) CYSTOSCOPY (N/A) LAPAROSCOPIC OOPHORECTOMY (Left)  Subjective: Patient reports excellent pain control.  Walking easily.  Passing flatus this morning.  Minimal vaginal bleeding.  No nausea overnight.  Ate regular breakfast.  Objective: I have reviewed patient's vital signs, intake and output, medications and labs. Vitals:   02/12/17 2000 02/13/17 0030 02/13/17 0440 02/13/17 0800  BP: 116/70 128/67 108/68 123/79  Pulse: 94 97 82 79  Resp: 18 18 18 18   Temp: 98.4 F (36.9 C) 98.6 F (37 C) 98.1 F (36.7 C) 99.3 F (37.4 C)  TempSrc: Oral Oral  Oral  SpO2: 100% 97% 99% 100%  Post op Hb:  11.8  General: alert and cooperative Resp: clear to auscultation bilaterally Cardio: regular rate and rhythm, S1, S2 normal, no murmur, click, rub or gallop GI: normal findings: bowel sounds normal, soft, non-tender and mild distension and incision: clean, dry and intact Extremities: extremities normal, atraumatic, no cyanosis or edema Vaginal Bleeding: none  Assessment: s/p Procedure(s): HYSTERECTOMY TOTAL LAPAROSCOPIC WITH SALPINGECTOMY (Bilateral) CYSTOSCOPY (N/A) LAPAROSCOPIC OOPHORECTOMY (Left): stable and progressing well  Plan: Discharge home  LOS: 0 days    Valentina ShaggyMILLER, Tirso Laws SUZANNE 02/13/2017, 9:16 AM

## 2017-02-25 ENCOUNTER — Ambulatory Visit (INDEPENDENT_AMBULATORY_CARE_PROVIDER_SITE_OTHER): Payer: 59 | Admitting: Obstetrics & Gynecology

## 2017-02-25 ENCOUNTER — Encounter: Payer: Self-pay | Admitting: Obstetrics & Gynecology

## 2017-02-25 VITALS — BP 132/84 | HR 80 | Resp 14 | Ht 62.5 in | Wt 210.5 lb

## 2017-02-25 DIAGNOSIS — Z9889 Other specified postprocedural states: Secondary | ICD-10-CM

## 2017-02-25 MED ORDER — TEMAZEPAM 15 MG PO CAPS: 15.0000 mg | ORAL_CAPSULE | Freq: Every evening | ORAL | 0 refills | Status: DC | PRN

## 2017-02-25 NOTE — Progress Notes (Signed)
Post Operative Visit  Procedure: HYSTERECTOMY TOTAL LAPAROSCOPIC WITH SALPINGECTOMY, CYSTOSCOPY, LAPAROSCOPIC OOPHORECTOMY     Days Post-op: 13 days  Subjective: Reports she is doing well.  She is having some light spotting without odor.  Denies pain.  Has not taken anything for pain for three days.  Has taken some Tylenol.  Feels she is emptying her bladder without any issues.  Reports she is having some fatigue but this is improving.    Reports the Vicodin kept her up at night when she took it.  This has changed her sleep habits and she is staying up until 2 and 3am and sleeping until 11 am.  Would like something to help with this.    Reports she is feeling more emotionally fragile.  Denies depression symptoms.  Reports she is having some hot flashes.    Objective: BP 132/84 (BP Location: Right Arm, Patient Position: Sitting, Cuff Size: Normal)   Pulse 80   Resp 14   Ht 5' 2.5" (1.588 m)   Wt 210 lb 8 oz (95.5 kg)   LMP 01/27/2017 (Exact Date)   BMI 37.89 kg/m   EXAM General: alert and cooperative Resp: clear to auscultation bilaterally Cardio: regular rate and rhythm, S1, S2 normal, no murmur, click, rub or gallop GI: soft, non-tender; bowel sounds normal; no masses,  no organomegaly and incision: clean, dry and intact Extremities: extremities normal, atraumatic, no cyanosis or edema Vaginal Bleeding: minimal  Gyn:  NAEFG, cuff intact, scant bleeding, non tender, no masses  Assessment: s/p TLH/LSO, right salpingectomy, cystscoscopy  Plan: Recheck 3 weeks Trial of Restoril 15mg  nightly prn insomnia.  #30/0RF.  Pt will give update. Consider checking FSH at recheck if still having hot flashes and emotional changes

## 2017-03-04 ENCOUNTER — Encounter: Payer: Self-pay | Admitting: Obstetrics & Gynecology

## 2017-03-07 ENCOUNTER — Telehealth: Payer: Self-pay

## 2017-03-07 NOTE — Telephone Encounter (Signed)
Spoke with patient and she would like to return to work on 03-11-17. 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 She is off her pain medication and is sleeping better with the Restoril. If Dr. Hyacinth MeekerMiller states okay to return to work she will need a note stating this. Routing to Dr.Miller

## 2017-03-07 NOTE — Telephone Encounter (Signed)
My Chart message  Dr. Hyacinth MeekerMiller,    I would like to return to work effective Monday, March 11, 2017 on my primary job at [employer] [place]. I am doing very well and have not taken any pain medication since last Wednesday. 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     If you grant approval, can you send me a letter via MyChart stating I can return to work?     Thank you, Kristen [name]

## 2017-03-08 NOTE — Telephone Encounter (Signed)
OK for pt to return to work on 03/11/17 with retrictions for no lifting, pushing, pulling, tugging >10 pounds for six weeks after surgery date.  Then she will be released to return to work without restrictions.  I can review letter.  Thanks.

## 2017-03-08 NOTE — Telephone Encounter (Signed)
Spoke with patient and advised of Dr.Miller's message below. Written note to return to work on 03-11-17 with restrictions left at front desk for patient to pick up.

## 2017-03-18 ENCOUNTER — Ambulatory Visit: Payer: 59 | Admitting: Obstetrics & Gynecology

## 2017-03-19 ENCOUNTER — Encounter: Payer: Self-pay | Admitting: Obstetrics & Gynecology

## 2017-03-19 ENCOUNTER — Ambulatory Visit (INDEPENDENT_AMBULATORY_CARE_PROVIDER_SITE_OTHER): Payer: 59 | Admitting: Obstetrics & Gynecology

## 2017-03-19 VITALS — BP 122/80 | HR 68 | Ht 62.5 in | Wt 212.0 lb

## 2017-03-19 DIAGNOSIS — Z9889 Other specified postprocedural states: Secondary | ICD-10-CM

## 2017-03-19 MED ORDER — ESTRADIOL 0.5 MG PO TABS: 0.5000 mg | ORAL_TABLET | Freq: Every day | ORAL | 2 refills | Status: DC

## 2017-03-19 NOTE — Progress Notes (Signed)
Post Operative Visit  Procedure: HYSTERECTOMY TOTAL LAPAROSCOPIC WITH SALPINGECTOMY, CYSTOSCOPY, LAPAROSCOPIC OOPHORECTOMY    Days Post-op: 34 days   Subjective: Doing well from post-op standpoint.  Is back at work.  Energy is ok.  Sleep is improved.  Having hot flashes and some emotional lability.  We have discussed this earlier and given removal of one ovary, I was hoping that the ovarian function would improve as I feel this is the source of these symptoms.  However, she has not really improved from this standpoint so HRT discussed today with pt.  She is agreeable to starting.  Options reviewed.    Bowel, bladder function is back to normal.  No vaginal bleeding or discharge.  Objective: BP 122/80 (BP Location: Right Arm, Patient Position: Sitting, Cuff Size: Large)   Pulse 68   Ht 5' 2.5" (1.588 m)   Wt 212 lb (96.2 kg)   LMP 01/27/2017 (Exact Date)   BMI 38.16 kg/m   EXAM General: alert and cooperative Resp: clear to auscultation bilaterally Cardio: regular rate and rhythm, S1, S2 normal, no murmur, click, rub or gallop GI: soft, non-tender; bowel sounds normal; no masses,  no organomegaly and incision: clean, dry and intact Extremities: extremities normal, atraumatic, no cyanosis or edema Vaginal Bleeding: none  Gyn:  NAEFG, vaginal without lesions, cuff well approximated, no sutures seen or palpated, no pelvic masses  Assessment: s/p TLH/LSO, right salpingectomy, cystoscopy Emotional lability Mild insomnia  Plan: Will start estradiol 0.5mg .  rx to pharmacy.  #30/2RF Recheck 4 weeks.  Pt will call and give update in two weeks.

## 2017-04-16 ENCOUNTER — Telehealth: Payer: Self-pay | Admitting: Obstetrics & Gynecology

## 2017-04-16 ENCOUNTER — Ambulatory Visit: Payer: 59 | Admitting: Obstetrics & Gynecology

## 2017-04-16 NOTE — Progress Notes (Deleted)
Post Operative Visit  Procedure:HYSTERECTOMY TOTAL LAPAROSCOPIC WITH SALPINGECTOMY, CYSTOSCOPY, LAPAROSCOPIC OOPHORECTOMY      Days Post-op: 61 days   Subjective: ***  Objective: LMP 01/27/2017 (Exact Date)   EXAM General: {Exam; general:16600} Resp: {Exam; lung:16931} Cardio: {Exam; heart:5510} GI: {Exam, ZO:[id number]}GI:3041136} Extremities: {Exam; extremity:5109} Vaginal Bleeding: {exam; vaginal bleeding:3041122}  Assessment: s/p ***  Plan: Recheck {NUMBER 1-10:22536} weeks ***

## 2017-04-16 NOTE — Telephone Encounter (Signed)
Patient canceled her 4 week post op appointment today. Patient said "I am just leaving the doctor's office with my daughter who is ill". Patient rescheduled to 05/13/17 @ 4:00pm. Patient is questioning if she need to come back for follow up? Patient said "I feel fine and no longer having any discharge".

## 2017-04-16 NOTE — Telephone Encounter (Signed)
Spoke with patient. Advised post-op appointment is still recommended. Patient states she needs a 4pm appointment, will keep 10/8 as scheduled.   Patient states she is unsure if estradiol 0.5 mg tab daily is effective. Some days better than others, feels agitated and edgy. Patient states she is unsure if this is hormones or daily stresses, such as her daughter being sick. Patient states she plans to discuss at f/u OV.   Advised patient would update Dr. Hyacinth MeekerMiller and return call with any additional recommendations, patient thankful for f/u and verbalizes understanding.  Dr.Miller -routing FYI, any additional recommendations?

## 2017-04-17 IMAGING — XA DG FLUORO GUIDE NDL PLC/BX
1 series · 1 of 1 positions shown · non-contrast
Comparison: none

CLINICAL DATA: Pseudotumor. Obesity. Headaches and visual
disturbance.

[Series 1: ortho standard · 1 of 1 slices shown]
[im 1/1]
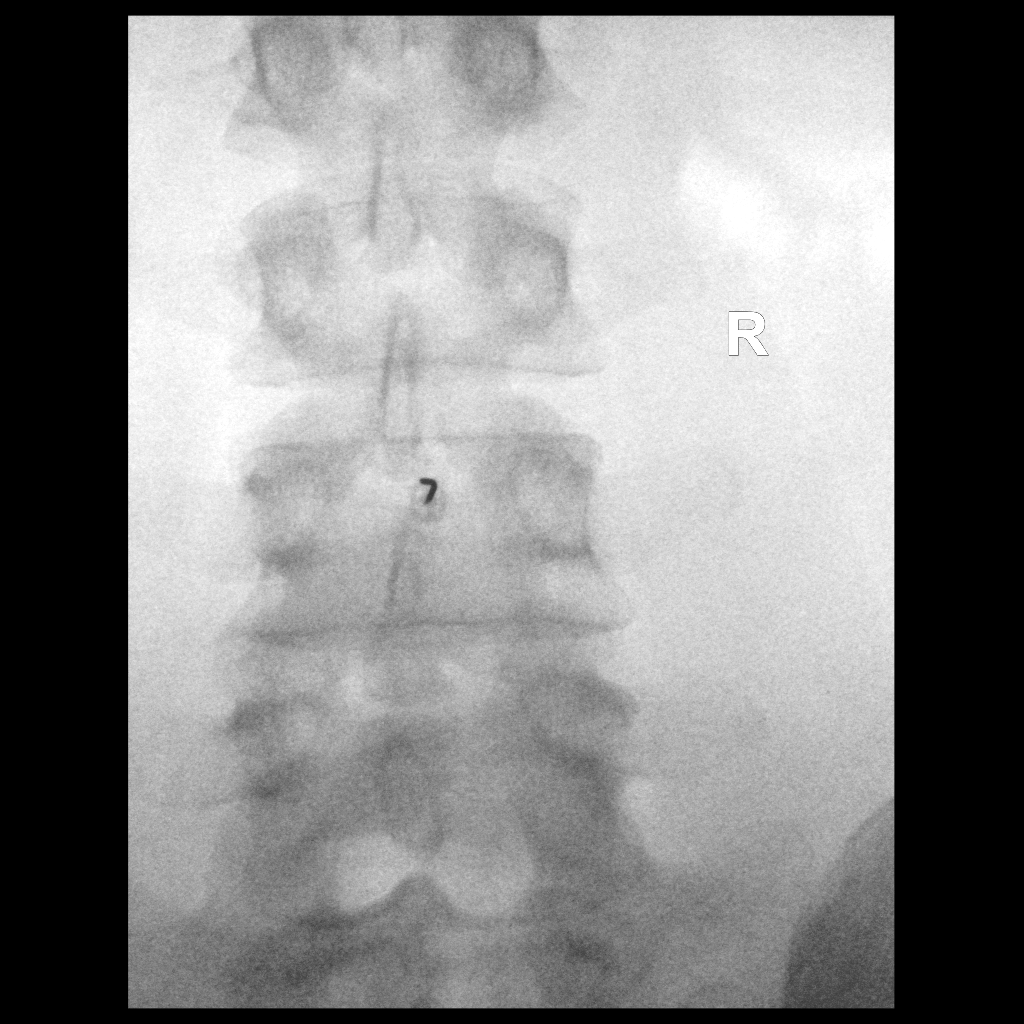

[1 of 1 positions shown; findings below may reference images not displayed]

EXAM:
DIAGNOSTIC LUMBAR PUNCTURE UNDER FLUOROSCOPIC GUIDANCE

FLUOROSCOPY TIME:  Fluoroscopy Time:  2 seconds

Radiation Exposure Index (if provided by the fluoroscopic device):
17.97 microGray*m^2

Number of Acquired Spot Images: 0

PROCEDURE:
Informed consent was obtained from the patient prior to the
procedure, including potential complications of headache, allergy,
and pain. With the patient prone, the lower back was prepped with
Betadine. 1% Lidocaine was used for local anesthesia. Lumbar
puncture was performed at the L3-4 level using a 6 inch 20 gauge
needle via a right paramedian approach with return of clear CSF with
an opening pressure of 16 cm water (measured in the left lateral
decubitus position). 9 ml of CSF were obtained for laboratory
studies. The patient tolerated the procedure well and there were no
apparent complications.
IMPRESSION: Successful fluoroscopic guided lumbar puncture. Opening pressure 16
cm water.

## 2017-04-17 MED ORDER — ESTRADIOL 1 MG PO TABS: 1.0000 mg | ORAL_TABLET | Freq: Every day | ORAL | 0 refills | Status: DC

## 2017-04-17 NOTE — Telephone Encounter (Signed)
Ok for her to double the estradiol to 1.0mg .  She can double what she has and ok to call in RX if needed.    I do think she should come back as I was going to recheck some blood work at follow up.  Appt time is fine as it will give time for new HRT dosage to be more effective.  Thanks.

## 2017-04-17 NOTE — Telephone Encounter (Signed)
Spoke with patient, advised as seen below per Dr. Hyacinth MeekerMiller. Rx for Estradiol 1.0mg  po daily #30/0RF to verified pharmacy [facility]. Patient verbalizes understanding and is agreeable to plan.  Patient is agreeable to disposition. Will close encounter.

## 2017-05-09 ENCOUNTER — Encounter: Payer: Self-pay | Admitting: Primary Care

## 2017-05-09 DIAGNOSIS — Z9289 Personal history of other medical treatment: Secondary | ICD-10-CM

## 2017-05-10 NOTE — Telephone Encounter (Signed)
Patient is needing chest xray for positive PPD skin test. Her last chest xray was 5 years ago and is due for another. She was recently accepted into nursing school and is needing this done for admission. Xray ordered and pending.

## 2017-05-13 ENCOUNTER — Encounter: Payer: Self-pay | Admitting: Primary Care

## 2017-05-13 ENCOUNTER — Ambulatory Visit (INDEPENDENT_AMBULATORY_CARE_PROVIDER_SITE_OTHER)
Admission: RE | Admit: 2017-05-13 | Discharge: 2017-05-13 | Disposition: A | Discharge status: Home / Self Care | Payer: 59 | Source: Ambulatory Visit | Attending: Primary Care | Admitting: Primary Care

## 2017-05-13 ENCOUNTER — Ambulatory Visit (INDEPENDENT_AMBULATORY_CARE_PROVIDER_SITE_OTHER): Payer: 59 | Admitting: Obstetrics & Gynecology

## 2017-05-13 ENCOUNTER — Ambulatory Visit (INDEPENDENT_AMBULATORY_CARE_PROVIDER_SITE_OTHER): Payer: 59 | Admitting: Primary Care

## 2017-05-13 ENCOUNTER — Encounter: Payer: Self-pay | Admitting: Obstetrics & Gynecology

## 2017-05-13 VITALS — BP 122/82 | HR 66 | Temp 98.2°F | Ht 62.5 in | Wt 212.0 lb

## 2017-05-13 VITALS — BP 116/80 | HR 86 | Resp 12 | Wt 213.0 lb

## 2017-05-13 DIAGNOSIS — D649 Anemia, unspecified: Secondary | ICD-10-CM | POA: Diagnosis not present

## 2017-05-13 DIAGNOSIS — Z9289 Personal history of other medical treatment: Secondary | ICD-10-CM

## 2017-05-13 DIAGNOSIS — G4700 Insomnia, unspecified: Secondary | ICD-10-CM | POA: Diagnosis not present

## 2017-05-13 DIAGNOSIS — R7611 Nonspecific reaction to tuberculin skin test without active tuberculosis: Secondary | ICD-10-CM

## 2017-05-13 DIAGNOSIS — I10 Essential (primary) hypertension: Secondary | ICD-10-CM | POA: Diagnosis not present

## 2017-05-13 DIAGNOSIS — N898 Other specified noninflammatory disorders of vagina: Secondary | ICD-10-CM | POA: Diagnosis not present

## 2017-05-13 DIAGNOSIS — K219 Gastro-esophageal reflux disease without esophagitis: Secondary | ICD-10-CM

## 2017-05-13 DIAGNOSIS — R232 Flushing: Secondary | ICD-10-CM

## 2017-05-13 MED ORDER — TEMAZEPAM 15 MG PO CAPS: 15.0000 mg | ORAL_CAPSULE | Freq: Every evening | ORAL | 1 refills | Status: DC | PRN

## 2017-05-13 MED ORDER — PANTOPRAZOLE SODIUM 20 MG PO TBEC: 20.0000 mg | DELAYED_RELEASE_TABLET | Freq: Every day | ORAL | 0 refills | Status: DC

## 2017-05-13 MED ORDER — FLUCONAZOLE 150 MG PO TABS: 150.0000 mg | ORAL_TABLET | Freq: Once | ORAL | 0 refills | Status: DC

## 2017-05-13 MED ORDER — LOSARTAN POTASSIUM 50 MG PO TABS: 50.0000 mg | ORAL_TABLET | Freq: Every day | ORAL | 0 refills | Status: DC

## 2017-05-13 NOTE — Progress Notes (Signed)
Prescription for restoril  capsule, #30, 1RF faxed to [facility]. Fax #: [phone].

## 2017-05-13 NOTE — Assessment & Plan Note (Signed)
Stable in the office today. Will trial Losartan given chronic headaches and dizziness on HCTZ. Rx for losartan 50 mg sent to pharmacy. Follow up in 2-3 weeks for BP check and BMP.

## 2017-05-13 NOTE — Assessment & Plan Note (Signed)
Doing well on temazepam PRN. Using appropriately.

## 2017-05-13 NOTE — Assessment & Plan Note (Signed)
Will trial reduced dose of pantoprazole 20 mg. She will update if symptoms return.

## 2017-05-13 NOTE — Progress Notes (Signed)
Subjective:    Patient ID: Kristen [name], female    DOB: 1971/03/16, 46 y.o.   MRN: [id number]  HPI  Ms. Kristen [name] is a 46 year old female who presents today for follow up.  1) Essential Hypertension: Currently managed on HCTZ 25 mg. She's continued to experience intermittent headaches with pressure, less frequently dizziness. She's interested in switching her blood pressure medication. She denies chest pain, shortness of breath.   BP Readings from Last 3 Encounters:  05/13/17 122/82  03/19/17 122/80  02/25/17 132/84     2) Insomnia: Currently managed on Temazepam 15 mg for which she takes twice weekly on average. Overall feeling well managed on this regimen.   3) GERD: Currently managed on pantoprazole 40 mg, she's been out of this medication for the past 1 week and has noticed a return in symptoms. She will take this 1-7 days weekly on average, more frequently with increased stress.   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 Due for repeat chest xray's every 5 years, with her xray being due this year. 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 She denies cough, unexplained weight loss, night sweats, hemoptysis.   Review of Systems  Respiratory: Negative for shortness of breath.   Cardiovascular: Negative for chest pain.  Gastrointestinal:       GERD  Neurological: Positive for dizziness and headaches.  Psychiatric/Behavioral:       Doing well with temazepam.       Past Medical History:  Diagnosis Date  . Abnormal Pap smear of cervix    dysplasia   . Anemia    history of  . Anxiety   . Asthma    seasonal asthma, has not required use of inhaler in over 2 years  . Cervical dysplasia 1993  . Depression   . Diverticulitis   . Dysmenorrhea   . Essential hypertension   . GERD (gastroesophageal reflux disease)   . Headache    history of migraines  . IBS (irritable bowel syndrome)   . STD (sexually transmitted disease)  ~1998   chlamydia      Social History   Social History  . Marital status: Legally Separated    Spouse name: N/A  . Number of children: 3  . Years of education: 24   Occupational History  .      [employer], [employer] Coach   Social History Main Topics  . Smoking status: Never Smoker  . Smokeless tobacco: Never Used  . Alcohol use No  . Drug use: No  . Sexual activity: Not Currently    Partners: Male    Birth control/ protection: Surgical   Other Topics Concern  . Not on file   Social History Narrative   Separated.    3 children.    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     Past Surgical History:  Procedure Laterality Date  . ADENOIDECTOMY     lymph nodes removed  . APPENDECTOMY  1990  . COLONOSCOPY    . COLPOSCOPY  1992  . CYSTOSCOPY N/A 02/12/2017   Procedure: CYSTOSCOPY;  Surgeon: Jerene Bears, MD;  Location: [facility];  Service: Gynecology;  Laterality: N/A;  . TONSILLECTOMY    . TOTAL LAPAROSCOPIC HYSTERECTOMY WITH SALPINGECTOMY Bilateral 02/12/2017   Procedure: HYSTERECTOMY TOTAL LAPAROSCOPIC WITH SALPINGECTOMY;  Surgeon: Jerene Bears, MD;  Location: [facility];  Service: Gynecology;  Laterality: Bilateral;  . TUBAL LIGATION      Family History  Problem Relation Age of Onset  . Hypertension Mother   . Depression Mother   . Diabetes Maternal Grandmother   . Hypertension Maternal Grandmother   . Colon cancer Father 53  . Diabetes Brother   . Stomach cancer Neg Hx     Allergies  Allergen Reactions  . Amoxicillin Nausea Only    Has patient had a PCN reaction causing immediate rash, facial/tongue/throat swelling, SOB or lightheadedness with hypotension: No Has patient had a PCN reaction causing severe rash involving mucus membranes or skin necrosis: No Has patient had a PCN reaction that required hospitalization: No Has patient had a PCN reaction occurring within the last 10 years: Yes If all of the  above answers are "NO", then may proceed with Cephalosporin use.   Marland Kitchen Penicillin G Rash    Has patient had a PCN reaction causing immediate rash, facial/tongue/throat swelling, SOB or lightheadedness with hypotension: Unknown Has patient had a PCN reaction causing severe rash involving mucus membranes or skin necrosis: Unknown Has patient had a PCN reaction that required hospitalization: Unknown Has patient had a PCN reaction occurring within the last 10 years: No If all of the above answers are "NO", then may proceed with Cephalosporin use.     Current Outpatient Prescriptions on File Prior to Visit  Medication Sig Dispense Refill  . estradiol (ESTRACE) 1 MG tablet Take 1 tablet (1 mg total) by mouth daily. 30 tablet 0  . Lifitegrast (XIIDRA OP) Place 1 application into both eyes at bedtime.     . temazepam (RESTORIL) 15 MG capsule Take 1 capsule (15 mg total) by mouth at bedtime as needed for sleep. 30 capsule 0   No current facility-administered medications on file prior to visit.     BP 122/82   Pulse 66   Temp 98.2 F (36.8 C) (Oral)   Ht 5' 2.5" (1.588 m)   Wt 212 lb (96.2 kg)   LMP 01/27/2017 (Exact Date)   SpO2 98%   BMI 38.16 kg/m    Objective:   Physical Exam  Constitutional: She appears well-nourished.  Neck: Neck supple.  Cardiovascular: Normal rate and regular rhythm.   Pulmonary/Chest: Effort normal and breath sounds normal.  Skin: Skin is warm and dry.          Assessment & Plan:

## 2017-05-13 NOTE — Patient Instructions (Signed)
Stop hydrochlorothiazide 25 mg tablets for high blood pressure. Start losartan 50 mg tablets for high blood pressure. Take 1 tablet by mouth once daily.  We've reduced the dose of your pantoprazole from 40 mg to 20 mg. Please notify me if symptoms return on the lower dose.  Please schedule a follow up visit in 2-3 weeks. This can be CPE or just a follow up.  It was a pleasure to see you today!

## 2017-05-13 NOTE — Progress Notes (Signed)
Post Operative Visit  Procedure: TLH, bilateral salpingectomy, cystoscopy  Days Post-op: 88 days   Subjective: Back at full time work.  Energy is much better.  Reports sleep is much better.  Still having some hot flashes.  Feels like she is having these about two times a week.  The hot flash lasts a few minutes.  This is better than it was immediately after surgery.    She reports her mood is better but she is still having some apathy issues.    Also reports new onset of vaginal odor.  Can't seen to get rid of it by washing.  No itching.  Occasionally has spotting with exercise as well.  Reports this is keeping her from exercising for fear of actual bleeding.  Objective: BP 116/80 (BP Location: Right Arm, Patient Position: Sitting, Cuff Size: Large)   Pulse 86   Resp 12   Wt 213 lb (96.6 kg)   LMP 01/27/2017 (Exact Date)   BMI 38.34 kg/m   EXAM General: alert and cooperative Resp: clear to auscultation bilaterally Cardio: regular rate and rhythm, S1, S2 normal, no murmur, click, rub or gallop GI: soft, non-tender; bowel sounds normal; no masses,  no organomegaly Extremities: extremities normal, atraumatic, no cyanosis or edema Vaginal Bleeding: none  Gyn:  NAEFG, whitish discharge present, cuff well healed, no sutures or masses, non tender Ext:  No edema  Assessment: s/p TLH/BSO/cystoscopy Vaginal discharge c/w yeast Possible spotting but no findings on exam that would explain this H/O anemia Insomnia that is improved restoril  nightly prn insomnia.  #30/1RF  Plan: FSH and estradiol obtained today.  Will consider changing HRT based on these results.   Affirm pending CBC obtained

## 2017-05-14 LAB — CBC
Hematocrit: 38.1 % (ref 34.0–46.6)
Hemoglobin: 12.4 g/dL (ref 11.1–15.9)
MCH: 31.8 pg (ref 26.6–33.0)
MCHC: 32.5 g/dL (ref 31.5–35.7)
MCV: 98 fL — ABNORMAL HIGH (ref 79–97)
Platelets: 379 10*3/uL (ref 150–379)
RBC: 3.9 x10E6/uL (ref 3.77–5.28)
RDW: 13.4 % (ref 12.3–15.4)
WBC: 6.4 10*3/uL (ref 3.4–10.8)

## 2017-05-14 LAB — VAGINITIS/VAGINOSIS, DNA PROBE
Candida Species: POSITIVE — AB
Gardnerella vaginalis: NEGATIVE
Trichomonas vaginosis: NEGATIVE

## 2017-05-14 LAB — ESTRADIOL: Estradiol: 28.2 pg/mL

## 2017-05-14 LAB — FOLLICLE STIMULATING HORMONE: FSH: 14 m[IU]/mL

## 2017-05-16 MED ORDER — FLUCONAZOLE 150 MG PO TABS: 150.0000 mg | ORAL_TABLET | Freq: Once | ORAL | 0 refills | Status: AC

## 2017-05-17 ENCOUNTER — Telehealth: Payer: Self-pay | Admitting: *Deleted

## 2017-05-17 NOTE — Telephone Encounter (Signed)
-----   Message from Jerene Bears, MD sent at 05/16/2017 11:23 PM EDT ----- Please let pt know the affirm testing showed yeast.  Diflucan  po x 1, repeat 72 hours sent to pharmacy on file.  After she is finished with this, I want her to take a picture of any bleeding/spotting that she has and call to let me know when this occurs.  I could not find a source of her bleeding on exam.  Her FSH is not in menopausal range but her estrogen level is low.  I would like to keep her HRT at the current dosage.  Anemia has resolved and CBC is normal.    She complained of some apathy since surgery.  I don't have a good recommendation for this at this time unless she wants to try a low SSRI like lexapro or paxil.  We could try this for a month and just see, if she wants, or wait and see if this resolves, continues, or worsens.

## 2017-05-17 NOTE — Telephone Encounter (Signed)
Message left to return call to Kristen [name] at [phone].    

## 2017-05-20 MED ORDER — TERCONAZOLE 0.4 % VA CREA: TOPICAL_CREAM | VAGINAL | 0 refills | Status: DC

## 2017-05-20 MED ORDER — VENLAFAXINE HCL ER 37.5 MG PO CP24: 37.5000 mg | ORAL_CAPSULE | Freq: Every day | ORAL | 1 refills | Status: DC

## 2017-05-20 NOTE — Telephone Encounter (Signed)
It is fine to start the Effexor 37.5mg  daily.  #30/1RF.  Can stop the estrogen after being on the effexor for a week (unless she's going to run out before then.)  Would recommend retreating with Terazol 7 one applicator nightly x 7 nights instead of repeating the Diflucan.  If symptoms continue, would recommend repeat testing.  Thanks.

## 2017-05-20 NOTE — Telephone Encounter (Signed)
Patient returned call. All results reviewed with patient and she verbalized understanding. Patient states she was given a prescription for diflucan at her appointment on 05/13/17 and she took both of the tablets. Patient states she is still having symptoms. Patient would like to know if Dr. Hyacinth Meeker wants her to repeat the diflucan with the prescription called in to her pharmacy. RN advised would review with Dr. Hyacinth Meeker. Patient also states at her appointment she and Dr. Hyacinth Meeker discussed switching to the estrogen patches because the pills are wearing off around lunch time and patient states,"I feel worse after lunch." RN advised would review with Dr. Hyacinth Meeker and return call to patient. Patient agreeable. Patient states she would like to discuss apathy with PCP and get feedback on medication management before she makes a decision about treatment or not.   Routing to provider for review.

## 2017-05-20 NOTE — Telephone Encounter (Signed)
Patient called back to state she had talked with Mayra Reel, her NP, and states Jae Dire suggested effexor 37.5 mg which would help with symptoms and vasomotor symptoms. Patient states if Dr. Hyacinth Meeker doesn't want to fill, she will make an appointment with NP to discuss treatment.   Routing to provider for review.

## 2017-05-20 NOTE — Telephone Encounter (Signed)
Call to patient. Message given to patient as seen below from Dr. Hyacinth Meeker. Patient verbalized understanding. Patient requests prescriptions be sent to [facility]. Instructions on use reviewed with patient and she verbalized understanding. Prescription for effexor 37.5mg  #30, 1RF and Terazol 7 #45g tube, 0RF. Patient aware to return call to office if symptoms do not improve.   Routing to provider for final review. Patient agreeable to disposition. Will close encounter.

## 2017-05-28 ENCOUNTER — Ambulatory Visit (INDEPENDENT_AMBULATORY_CARE_PROVIDER_SITE_OTHER): Payer: 59 | Admitting: Primary Care

## 2017-05-28 ENCOUNTER — Encounter: Payer: Self-pay | Admitting: Primary Care

## 2017-05-28 VITALS — BP 120/78 | HR 77 | Temp 97.7°F | Ht 62.0 in | Wt 215.8 lb

## 2017-05-28 DIAGNOSIS — G4700 Insomnia, unspecified: Secondary | ICD-10-CM | POA: Diagnosis not present

## 2017-05-28 DIAGNOSIS — E669 Obesity, unspecified: Secondary | ICD-10-CM

## 2017-05-28 DIAGNOSIS — R232 Flushing: Secondary | ICD-10-CM

## 2017-05-28 DIAGNOSIS — K219 Gastro-esophageal reflux disease without esophagitis: Secondary | ICD-10-CM

## 2017-05-28 DIAGNOSIS — Z0000 Encounter for general adult medical examination without abnormal findings: Secondary | ICD-10-CM

## 2017-05-28 DIAGNOSIS — I10 Essential (primary) hypertension: Secondary | ICD-10-CM

## 2017-05-28 NOTE — Patient Instructions (Signed)
Complete lab work prior to leaving today. I will notify you of your results once received.   Start exercising. You should be getting 150 minutes of moderate intensity exercise weekly.  It's important to improve your diet by reducing consumption of fast food, fried food, processed snack foods, sugary drinks. Increase consumption of fresh vegetables and fruits, whole grains, water.  Ensure you are drinking 64 ounces of water daily.  Consider seeing therapy as discussed.  Follow up with your gynecologist as discussed.  It was a pleasure to see you today!

## 2017-05-28 NOTE — Assessment & Plan Note (Signed)
Discussed the importance of a healthy diet and regular exercise in order for weight loss, and to reduce the risk of other medical problems.  

## 2017-05-28 NOTE — Assessment & Plan Note (Addendum)
Stable in the office today. Stopped taking losartan 50 mg one week ago. Currently taking HCTZ, continue same. Will have her monitor at home and report readings of 135/90 or higher.

## 2017-05-28 NOTE — Progress Notes (Signed)
Subjective:    Patient ID: Kristen [name], female    DOB: 1971/04/24, 46 y.o.   MRN: [id number]  HPI  Kristen [name] is a 46 year old female who presents today for complete physical.  Immunizations: -Tetanus: Completed in 2016 -Influenza: Completed this season   Diet: She endorses a poor diet. She is working on portion control.  Breakfast: Fast food Lunch: Fast food Dinner: Fast food Snacks: None Desserts: Occasionally Beverages: Water, sweet tea  Exercise: She is not currently exercise.  Eye exam: Completed in 2018 Dental exam: Due, plans on scheduling  Pap Smear: Completed in 2018, negative Mammogram: Completed in 2018, negative    Review of Systems  Constitutional: Negative for unexpected weight change.  HENT: Negative for rhinorrhea.   Respiratory: Negative for cough and shortness of breath.   Cardiovascular: Negative for chest pain.  Gastrointestinal: Negative for constipation and diarrhea.  Genitourinary: Negative for difficulty urinating.       Hot flashes  Musculoskeletal: Negative for arthralgias and myalgias.  Skin: Negative for rash.  Allergic/Immunologic: Negative for environmental allergies.  Neurological: Negative for dizziness, numbness and headaches.  Psychiatric/Behavioral:       Feeling increased stress since mother moved into her home.       Past Medical History:  Diagnosis Date  . Abnormal Pap smear of cervix    dysplasia   . Anemia    history of  . Anxiety   . Asthma    seasonal asthma, has not required use of inhaler in over 2 years  . Cervical dysplasia 1993  . Depression   . Diverticulitis   . Dysmenorrhea   . Essential hypertension   . GERD (gastroesophageal reflux disease)   . Headache    history of migraines  . IBS (irritable bowel syndrome)   . STD (sexually transmitted disease) ~1998   chlamydia      Social History   Social History  . Marital status: Legally Separated    Spouse name: N/A  . Number of children: 3  .  Years of education: 71   Occupational History  .      [employer], [employer]   Social History Main Topics  . Smoking status: Never Smoker  . Smokeless tobacco: Never Used  . Alcohol use No  . Drug use: No  . Sexual activity: Not Currently    Partners: Male    Birth control/ protection: Surgical   Other Topics Concern  . Not on file   Social History Narrative   Separated.    3 children.    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     Past Surgical History:  Procedure Laterality Date  . ADENOIDECTOMY     lymph nodes removed  . APPENDECTOMY  1990  . COLONOSCOPY    . COLPOSCOPY  1992  . CYSTOSCOPY N/A 02/12/2017   Procedure: CYSTOSCOPY;  Surgeon: Jerene Bears, MD;  Location: [facility];  Service: Gynecology;  Laterality: N/A;  . TONSILLECTOMY    . TOTAL LAPAROSCOPIC HYSTERECTOMY WITH SALPINGECTOMY Bilateral 02/12/2017   Procedure: HYSTERECTOMY TOTAL LAPAROSCOPIC WITH SALPINGECTOMY;  Surgeon: Jerene Bears, MD;  Location: [facility];  Service: Gynecology;  Laterality: Bilateral;  . TUBAL LIGATION      Family History  Problem Relation Age of Onset  . Hypertension Mother   . Depression Mother   . Diabetes Maternal Grandmother   . Hypertension Maternal Grandmother   . Colon  cancer Father [id number]  . Diabetes Brother   . Stomach cancer Neg Hx     Allergies  Allergen Reactions  . Amoxicillin Nausea Only    Has patient had a PCN reaction causing immediate rash, facial/tongue/throat swelling, SOB or lightheadedness with hypotension: No Has patient had a PCN reaction causing severe rash involving mucus membranes or skin necrosis: No Has patient had a PCN reaction that required hospitalization: No Has patient had a PCN reaction occurring within the last 10 years: Yes If all of the above answers are "NO", then may proceed with Cephalosporin use.   Marland Kitchen. Penicillin G Rash    Has patient had a PCN reaction causing immediate rash,  facial/tongue/throat swelling, SOB or lightheadedness with hypotension: Unknown Has patient had a PCN reaction causing severe rash involving mucus membranes or skin necrosis: Unknown Has patient had a PCN reaction that required hospitalization: Unknown Has patient had a PCN reaction occurring within the last 10 years: No If all of the above answers are "NO", then may proceed with Cephalosporin use.     Current Outpatient Prescriptions on File Prior to Visit  Medication Sig Dispense Refill  . estradiol (ESTRACE) 1 MG tablet Take 1 tablet (1 mg total) by mouth daily. 30 tablet 0  . Lifitegrast (XIIDRA OP) Place 1 application into both eyes at bedtime.     Marland Kitchen. losartan (COZAAR) 50 MG tablet Take 1 tablet (50 mg total) by mouth daily. 90 tablet 0  . pantoprazole (PROTONIX) 20 MG tablet Take 1 tablet (20 mg total) by mouth daily. 90 tablet 0  . temazepam (RESTORIL) 15 MG capsule Take 1 capsule (15 mg total) by mouth at bedtime as needed for sleep. 30 capsule 1  . terconazole (TERAZOL 7) 0.4 % vaginal cream Place one applicator vaginally at night for 7 nights 45 g 0  . venlafaxine XR (EFFEXOR XR) 37.5 MG 24 hr capsule Take 1 capsule (37.5 mg total) by mouth daily with breakfast. 30 capsule 1   No current facility-administered medications on file prior to visit.     BP 120/78   Pulse 77   Temp 97.7 F (36.5 C) (Oral)   Ht 5\' 2"  (1.575 m)   Wt 215 lb 12.8 oz (97.9 kg)   LMP 01/27/2017 (Exact Date)   SpO2 98%   BMI 39.47 kg/m    Objective:   Physical Exam  Constitutional: She is oriented to person, place, and time. She appears well-nourished.  HENT:  Right Ear: Tympanic membrane and ear canal normal.  Left Ear: Tympanic membrane and ear canal normal.  Nose: Nose normal.  Mouth/Throat: Oropharynx is clear and moist.  Eyes: Pupils are equal, round, and reactive to light. Conjunctivae and EOM are normal.  Neck: Neck supple. No thyromegaly present.  Cardiovascular: Normal rate and regular  rhythm.   No murmur heard. Pulmonary/Chest: Effort normal and breath sounds normal. She has no rales.  Abdominal: Soft. Bowel sounds are normal. There is no tenderness.  Musculoskeletal: Normal range of motion.  Lymphadenopathy:    She has no cervical adenopathy.  Neurological: She is alert and oriented to person, place, and time. She has normal reflexes. No cranial nerve deficit.  Skin: Skin is warm and dry. No rash noted.  Psychiatric: She has a normal mood and affect.          Assessment & Plan:

## 2017-05-28 NOTE — Assessment & Plan Note (Addendum)
Immunization UTD. Pap smear UTD. Mammogram UTD. Discussed the importance of a healthy diet and regular exercise in order for weight loss, and to reduce the risk of other medical problems. Exam unremarkable. Recommended she seek out therapist to discuss stress, she will think about this. Labs pending.  Follow up in 1 year.

## 2017-05-28 NOTE — Assessment & Plan Note (Addendum)
Present since hysterectomy.  Did a 2-3 day trial of Effexor (prescirbed by GYN) before she stopped. Managed on estradiol per GYN. She is now trying OTC treatment.

## 2017-05-28 NOTE — Assessment & Plan Note (Signed)
No use of pantoprazole in over 1 week.  Continue using PRN.

## 2017-05-28 NOTE — Assessment & Plan Note (Signed)
Doing well on temazepam, using twice weekly on average. Continue same.

## 2017-05-29 ENCOUNTER — Telehealth: Payer: Self-pay | Admitting: Obstetrics & Gynecology

## 2017-05-29 LAB — COMPREHENSIVE METABOLIC PANEL
ALT: 15 U/L (ref 0–35)
AST: 15 U/L (ref 0–37)
Albumin: 3.8 g/dL (ref 3.5–5.2)
Alkaline Phosphatase: 33 U/L — ABNORMAL LOW (ref 39–117)
BUN: 12 mg/dL (ref 6–23)
CO2: 27 mEq/L (ref 19–32)
Calcium: 9.1 mg/dL (ref 8.4–10.5)
Chloride: 105 mEq/L (ref 96–112)
Creatinine, Ser: 0.87 mg/dL (ref 0.40–1.20)
GFR: 90.22 mL/min (ref >60.00)
Glucose, Bld: 78 mg/dL (ref 70–99)
Potassium: 3.8 mEq/L (ref 3.5–5.1)
Sodium: 140 mEq/L (ref 135–145)
Total Bilirubin: 0.4 mg/dL (ref 0.2–1.2)
Total Protein: 7.2 g/dL (ref 6.0–8.3)

## 2017-05-29 LAB — CBC
HCT: 38 % (ref 36.0–46.0)
Hemoglobin: 12.6 g/dL (ref 12.0–15.0)
MCHC: 33.1 g/dL (ref 30.0–36.0)
MCV: 97.4 fl (ref 78.0–100.0)
Platelets: 341 10*3/uL (ref 150.0–400.0)
RBC: 3.9 Mil/uL (ref 3.87–5.11)
RDW: 13.1 % (ref 11.5–15.5)
WBC: 7.5 10*3/uL (ref 4.0–10.5)

## 2017-05-29 LAB — LIPID PANEL
Cholesterol: 121 mg/dL (ref 0–200)
HDL: 47.6 mg/dL (ref >39.00)
LDL Cholesterol: 63 mg/dL (ref 0–99)
NonHDL: 73.56
Total CHOL/HDL Ratio: 3
Triglycerides: 54 mg/dL (ref 0.0–149.0)
VLDL: 10.8 mg/dL (ref 0.0–40.0)

## 2017-05-29 LAB — HEMOGLOBIN A1C: Hgb A1c MFr Bld: 5.4 % (ref 4.6–6.5)

## 2017-05-29 LAB — TSH: TSH: 2.31 u[IU]/mL (ref 0.35–4.50)

## 2017-05-29 NOTE — Telephone Encounter (Signed)
Left message to call Jakyah Bradby at [phone].  

## 2017-05-29 NOTE — Telephone Encounter (Signed)
Patient called to report the Effexor she was prescribed was not working. She further said she didn't like the way it made her feel so she stopped taking it. Patient stated she is interested in a patch for estrogen instead to help with her hormones.  [facility]

## 2017-05-29 NOTE — Telephone Encounter (Signed)
Spoke with patient:   1. Stopped Effexor XR on 10/19, helped with hot flashes, made her feel like a "zombie". Symptoms have now resolved.   2. Currently taking estradiol 1 mg po daily, never stopped  3. Estradiol helps with hot flashes, feels medication wears off by the afternoon. Notices increased agitation and easily annoyed.   Patient requesting to try an estrogen patch to [facility]. Advised patient will review with Dr. Hyacinth MeekerMiller and return call with recommendations. Advised Dr. Hyacinth MeekerMiller is out of the office today, response may not be immediate. Patient verbalizes understanding and is agreeable.   Dr. Hyacinth MeekerMiller -please review and advise?

## 2017-05-29 NOTE — Telephone Encounter (Signed)
Ok to change patch to estradiol 0.1mg  twice weekly.  Ok to send in one month or three month supply depending on her desires.  Please ask her to give a follow up to me in about a month.  Thanks.

## 2017-05-30 MED ORDER — ESTRADIOL 0.1 MG/24HR TD PTTW: 1.0000 | MEDICATED_PATCH | TRANSDERMAL | 0 refills | Status: DC

## 2017-05-30 NOTE — Telephone Encounter (Signed)
Spoke with patient, advised as seen below per Dr. Hyacinth MeekerMiller. Patient request 90 day supply. Rx for Estradiol 0.1mg  patch #24/0RF sent to verified pharmacy. Estradiol 1mg  tab discontinued. Patient verbalizes understanding and is agreeable.  Routing to provider for final review. Patient is agreeable to disposition. Will close encounter.

## 2017-07-11 ENCOUNTER — Encounter: Payer: Self-pay | Admitting: *Deleted

## 2017-07-11 ENCOUNTER — Other Ambulatory Visit: Payer: Self-pay

## 2017-07-11 ENCOUNTER — Ambulatory Visit
Admission: EM | Admit: 2017-07-11 | Discharge: 2017-07-11 | Disposition: A | Discharge status: Home / Self Care | Payer: 59 | Attending: Family Medicine | Admitting: Family Medicine

## 2017-07-11 DIAGNOSIS — R05 Cough: Secondary | ICD-10-CM

## 2017-07-11 DIAGNOSIS — J0100 Acute maxillary sinusitis, unspecified: Secondary | ICD-10-CM | POA: Diagnosis not present

## 2017-07-11 LAB — RAPID STREP SCREEN (MED CTR MEBANE ONLY): Streptococcus, Group A Screen (Direct): NEGATIVE

## 2017-07-11 MED ORDER — DOXYCYCLINE HYCLATE 100 MG PO CAPS: 100.0000 mg | ORAL_CAPSULE | Freq: Two times a day (BID) | ORAL | 0 refills | Status: DC

## 2017-07-11 NOTE — ED Provider Notes (Signed)
[facility]    CSN: [id number] Arrival date & time: 07/11/17  1041  History   Chief Complaint Chief Complaint  Patient presents with  . Nasal Congestion  . Cough   HPI  46 year old female presents with the above complaints.  Patient states she has been sick since Saturday.  She has had severe sinus pressure and congestion.  Most notably in the maxillary sinus region.  She has had productive cough.  Discolored sputum, brown.  Associated watery eyes and runny nose.  She reports chills but denies fever.  She has been using over-the-counter TheraFlu and a [facility] day/night formulation without improvement.  No reported sick contacts.  She is missed several days of work.  Patient is concerned about influenza as she has a 564-year-old child at home.  Symptoms are severe.  No known exacerbating factors.  She has had no relief with the above treatments.  No other complaints at this time.  Past Medical History:  Diagnosis Date  . Abnormal Pap smear of cervix    dysplasia   . Anemia    history of  . Anxiety   . Asthma    seasonal asthma, has not required use of inhaler in over 2 years  . Cervical dysplasia 1993  . Depression   . Diverticulitis   . Dysmenorrhea   . Essential hypertension   . GERD (gastroesophageal reflux disease)   . Headache    history of migraines  . IBS (irritable bowel syndrome)   . STD (sexually transmitted disease) ~1998   chlamydia     Patient Active Problem List   Diagnosis Date Noted  . Hot flashes 05/28/2017  . Obesity (BMI 30-39.9) 09/03/2016  . Intracranial hypertension, benign 06/22/2016  . Visual disturbance 05/18/2016  . Other fatigue 03/01/2016  . Preventative health care 01/03/2016  . Insomnia 11/29/2015  . Essential hypertension 11/11/2015  . GERD (gastroesophageal reflux disease) 11/11/2015    Past Surgical History:  Procedure Laterality Date  . ADENOIDECTOMY     lymph nodes removed  . APPENDECTOMY  1990  . COLONOSCOPY    .  COLPOSCOPY  1992  . CYSTOSCOPY N/A 02/12/2017   Procedure: CYSTOSCOPY;  Surgeon: Jerene BearsMiller, Mary S, MD;  Location: [facility];  Service: Gynecology;  Laterality: N/A;  . TONSILLECTOMY    . TOTAL LAPAROSCOPIC HYSTERECTOMY WITH SALPINGECTOMY Bilateral 02/12/2017   Procedure: HYSTERECTOMY TOTAL LAPAROSCOPIC WITH SALPINGECTOMY;  Surgeon: Jerene BearsMiller, Mary S, MD;  Location: [facility];  Service: Gynecology;  Laterality: Bilateral;  . TUBAL LIGATION      OB History    Gravida Para Term Preterm AB Living   4 3 3  0 1 3   SAB TAB Ectopic Multiple Live Births   0 1 0 0 3       Home Medications    Prior to Admission medications   Medication Sig Start Date End Date Taking? Authorizing Provider  estradiol (VIVELLE-DOT) 0.1 MG/24HR patch Place 1 patch (0.1 mg total) onto the skin 2 (two) times a week. 05/30/17  Yes Jerene BearsMiller, Mary S, MD  hydrochlorothiazide (HYDRODIURIL) 12.5 MG tablet Take 12.5 mg by mouth 2 (two) times daily.   Yes [provider]  pantoprazole (PROTONIX) 20 MG tablet Take 1 tablet (20 mg total) by mouth daily. 05/13/17  Yes Doreene Nestlark, Katherine K, NP  temazepam (RESTORIL) 15 MG capsule Take 1 capsule (15 mg total) by mouth at bedtime as needed for sleep. 05/13/17  Yes Jerene BearsMiller, Mary S, MD  doxycycline (VIBRAMYCIN) 100 MG capsule Take  1 capsule (100 mg total) by mouth 2 (two) times daily. 07/11/17   Tommie Samsook, Jarrid Lienhard G, DO    Family History Family History  Problem Relation Age of Onset  . Hypertension Mother   . Depression Mother   . Diabetes Maternal Grandmother   . Hypertension Maternal Grandmother   . Colon cancer Father 651  . Diabetes Brother   . Stomach cancer Neg Hx     Social History Social History   Tobacco Use  . Smoking status: Never Smoker  . Smokeless tobacco: Never Used  Substance Use Topics  . Alcohol use: No    Alcohol/week: 0.0 oz  . Drug use: No     Allergies   Amoxicillin and Penicillin g   Review of Systems Review of Systems  Constitutional: Positive for  chills. Negative for fever.  HENT: Positive for congestion, rhinorrhea, sinus pressure and sinus pain.   Eyes:       Eye watering.  Respiratory: Positive for cough.    Physical Exam Triage Vital Signs ED Triage Vitals  Enc Vitals Group     BP 07/11/17 1052 (!) 159/101     Pulse Rate 07/11/17 1052 83     Resp 07/11/17 1052 18     Temp 07/11/17 1052 97.8 F (36.6 C)     Temp Source 07/11/17 1052 Oral     SpO2 07/11/17 1052 100 %     Weight 07/11/17 1053 215 lb (97.5 kg)     Height 07/11/17 1053 5' 1.5" (1.562 m)     Head Circumference --      Peak Flow --      Pain Score 07/11/17 1053 8     Pain Loc --      Pain Edu? --      Excl. in GC? --    Updated Vital Signs BP (!) 159/101 (BP Location: Left Arm)   Pulse 83   Temp 97.8 F (36.6 C) (Oral)   Resp 18   Ht 5' 1.5" (1.562 m)   Wt 215 lb (97.5 kg)   LMP 01/27/2017 (Exact Date)   SpO2 100%   BMI 39.97 kg/m   Physical Exam  Constitutional: She is oriented to person, place, and time. She appears well-developed and well-nourished. No distress.  HENT:  Head: Normocephalic and atraumatic.  Mouth/Throat: Oropharynx is clear and moist.  Normal TMs bilaterally. Severe left maxillary sinus tenderness to palpation/percussion.  Eyes: Conjunctivae are normal. Right eye exhibits no discharge. Left eye exhibits no discharge.  Neck: Neck supple.  No appreciable adenopathy.  Cardiovascular: Normal rate and regular rhythm.  No murmur heard. Pulmonary/Chest: Effort normal and breath sounds normal. She has no wheezes. She has no rales.  Neurological: She is alert and oriented to person, place, and time.  Skin: Skin is warm. No rash noted.  Psychiatric: She has a normal mood and affect. Her behavior is normal.  Vitals reviewed.  UC Treatments / Results  Labs (all labs ordered are listed, but only abnormal results are displayed) Labs Reviewed  RAPID STREP SCREEN (NOT AT [facility])    EKG  EKG Interpretation None        Radiology No results found.  Procedures Procedures (including critical care time)  Medications Ordered in UC Medications - No data to display   Initial Impression / Assessment and Plan / UC Course  I have reviewed the triage vital signs and the nursing notes.  Pertinent labs & imaging results that were available during my care of the  patient were reviewed by me and considered in my medical decision making (see chart for details).     46 year old female presents with signs and symptoms of sinusitis.  Treating with doxycycline (patient has allergy to penicillin and amoxicillin).  Final Clinical Impressions(s) / UC Diagnoses   Final diagnoses:  Acute non-recurrent maxillary sinusitis    ED Discharge Orders        Ordered    doxycycline (VIBRAMYCIN) 100 MG capsule  2 times daily     07/11/17 1111     Controlled Substance Prescriptions [place] Controlled Substance Registry consulted? Not Applicable   Tommie Sams, DO 07/11/17 1116

## 2017-07-11 NOTE — ED Triage Notes (Signed)
PAtient started having symptoms of nasal congestion, cough, sore throat, and headache. 5 days ago.

## 2017-07-14 LAB — CULTURE, GROUP A STREP (THRC)

## 2017-08-15 ENCOUNTER — Other Ambulatory Visit: Payer: Self-pay | Admitting: Obstetrics & Gynecology

## 2017-08-15 MED ORDER — ESTRADIOL 0.1 MG/24HR TD PTTW: 1.0000 | MEDICATED_PATCH | TRANSDERMAL | 3 refills | Status: DC

## 2017-08-15 NOTE — Telephone Encounter (Signed)
Routing to provider for review.

## 2017-08-15 NOTE — Telephone Encounter (Signed)
Medication refill request: Vivelle-Dot Last OV:  05/13/17 SM Next AEX: none scheduled Last MMG (if hormonal medication request): 12/04/16 BIRADS 1 negative/density b Refill authorized: 05/30/17 #24 w/0 refills; today please advise

## 2017-08-15 NOTE — Telephone Encounter (Signed)
Patient called to be sure we received the refill request. She only has one patch left, reportedly.

## 2017-08-31 ENCOUNTER — Ambulatory Visit (INDEPENDENT_AMBULATORY_CARE_PROVIDER_SITE_OTHER): Payer: Self-pay | Admitting: Family Medicine

## 2017-08-31 VITALS — BP 130/90 | HR 78 | Temp 98.1°F | Resp 16 | Wt 216.2 lb

## 2017-08-31 DIAGNOSIS — R350 Frequency of micturition: Secondary | ICD-10-CM

## 2017-08-31 DIAGNOSIS — R110 Nausea: Secondary | ICD-10-CM

## 2017-08-31 DIAGNOSIS — R300 Dysuria: Secondary | ICD-10-CM

## 2017-08-31 LAB — POCT URINALYSIS DIPSTICK
Blood, UA: NEGATIVE
Glucose, UA: NEGATIVE
Ketones, UA: NEGATIVE
Leukocytes, UA: NEGATIVE
Nitrite, UA: NEGATIVE
Protein, UA: NEGATIVE
Spec Grav, UA: 1.025 (ref 1.010–1.025)
Urobilinogen, UA: 0.2 E.U./dL
pH, UA: 5 (ref 5.0–8.0)

## 2017-08-31 MED ORDER — ONDANSETRON HCL 4 MG PO TABS: 4.0000 mg | ORAL_TABLET | Freq: Three times a day (TID) | ORAL | 0 refills | Status: DC | PRN

## 2017-08-31 MED ORDER — PHENAZOPYRIDINE HCL 100 MG PO TABS: 100.0000 mg | ORAL_TABLET | Freq: Three times a day (TID) | ORAL | 0 refills | Status: DC | PRN

## 2017-08-31 NOTE — Patient Instructions (Addendum)
I am treating you today for your symptoms only as testing of your urine was negative. I recommend for dysuria symptoms, take pyridine  100 mg, 3 times daily as needed for burning with urination. For nausea, I am sending over a prescription for Zofran 4 mg every 8 hours as needed. I recommend follow-up with PCP as you will require further evaluation of your symptoms.    Urinary Frequency, Adult Urinary frequency means urinating more often than usual. People with urinary frequency urinate at least 8 times in 24 hours, even if they drink a normal amount of fluid. Although they urinate more often than normal, the total amount of urine produced in a day may be normal. Urinary frequency is also called pollakiuria. What are the causes? This condition may be caused by:  A urinary tract infection.  Obesity.  Bladder problems, such as bladder stones.  Caffeine or alcohol.  Eating food or drinking fluids that irritate the bladder. These include coffee, tea, soda, artificial sweeteners, citrus, tomato-based foods, and chocolate.  Certain medicines, such as medicines that help the body get rid of extra fluid (diuretics).  Muscle or nerve weakness.  Overactive bladder.  Chronic diabetes.  Interstitial cystitis.  In men, problems with the prostate, such as an enlarged prostate.  In women, pregnancy.  In some cases, the cause may not be known. What increases the risk? This condition is more likely to develop in:  Women who have gone through menopause.  Men with prostate problems.  People with a disease or injury that affects the nerves or spinal cord.  People who have or have had a condition that affects the brain, such as a stroke.  What are the signs or symptoms? Symptoms of this condition include:  Feeling an urgent need to urinate often. The stress and anxiety of needing to find a bathroom quickly can make this urge worse.  Urinating 8 or more times in 24 hours.  Urinating  as often as every 1 to 2 hours.  How is this diagnosed? This condition is diagnosed based on your symptoms, your medical history, and a physical exam. You may have tests, such as:  Blood tests.  Urine tests.  Imaging tests, such as X-rays or ultrasounds.  A bladder test.  A test of your neurological system. This is the body system that senses the need to urinate.  A test to check for problems in the urethra and bladder called cystoscopy.  You may also be asked to keep a bladder diary. A bladder diary is a record of what you eat and drink, how often you urinate, and how much you urinate. You may need to see a health care provider who specializes in conditions of the urinary tract (urologist) or kidneys (nephrologist). How is this treated? Treatment for this condition depends on the cause. Sometimes the condition goes away on its own and treatment is not necessary. If treatment is needed, it may include:  Taking medicine.  Learning exercises that strengthen the muscles that help control urination.  Following a bladder training program. This may include: ? Learning to delay going to the bathroom. ? Double urinating (voiding). This helps if you are not completely emptying your bladder. ? Scheduled voiding.  Making diet changes, such as: ? Avoiding caffeine. ? Drinking fewer fluids, especially alcohol. ? Not drinking in the evening. ? Not having foods or drinks that may irritate the bladder. ? Eating foods that help prevent or ease constipation. Constipation can make this condition worse.  Having the nerves in your bladder stimulated. There are two options for stimulating the nerves to your bladder: ? Outpatient electrical nerve stimulation. This is done by your health care provider. ? Surgery to implant a bladder pacemaker. The pacemaker helps to control the urge to urinate.  Follow these instructions at home:  Keep a bladder diary if told to by your health care  provider.  Take over-the-counter and prescription medicines only as told by your health care provider.  Do any exercises as told by your health care provider.  Follow a bladder training program as told by your health care provider.  Make any recommended diet changes.  Keep all follow-up visits as told by your health care provider. This is important. Contact a health care provider if:  You start urinating more often.  You feel pain or irritation when you urinate.  You notice blood in your urine.  Your urine looks cloudy.  You develop a fever.  You begin vomiting. Get help right away if:  You are unable to urinate. This information is not intended to replace advice given to you by your health care provider. Make sure you discuss any questions you have with your health care provider. Document Released: 05/19/2009 Document Revised: 08/24/2015 Document Reviewed: 02/16/2015 Elsevier Interactive Patient Education  2018 [employer] Dysuria Dysuria is pain or discomfort while urinating. The pain or discomfort may be felt in the tube that carries urine out of the bladder (urethra) or in the surrounding tissue of the genitals. The pain may also be felt in the groin area, lower abdomen, and lower back. You may have to urinate frequently or have the sudden feeling that you have to urinate (urgency). Dysuria can affect both men and women, but is more common in women. Dysuria can be caused by many different things, including:  Urinary tract infection in women.  Infection of the kidney or bladder.  Kidney stones or bladder stones.  Certain sexually transmitted infections (STIs), such as chlamydia.  Dehydration.  Inflammation of the vagina.  Use of certain medicines.  Use of certain soaps or scented products that cause irritation.  Follow these instructions at home: Watch your dysuria for any changes. The following actions may help to reduce any discomfort you are  feeling:  Drink enough fluid to keep your urine clear or pale yellow.  Empty your bladder often. Avoid holding urine for long periods of time.  After a bowel movement or urination, women should cleanse from front to back, using each tissue only once.  Empty your bladder after sexual intercourse.  Take medicines only as directed by your health care provider.  If you were prescribed an antibiotic medicine, finish it all even if you start to feel better.  Avoid caffeine, tea, and alcohol. They can irritate the bladder and make dysuria worse. In men, alcohol may irritate the prostate.  Keep all follow-up visits as directed by your health care provider. This is important.  If you had any tests done to find the cause of dysuria, it is your responsibility to obtain your test results. Ask the lab or department performing the test when and how you will get your results. Talk with your health care provider if you have any questions about your results.  Contact a health care provider if:  You develop pain in your back or sides.  You have a fever.  You have nausea or vomiting.  You have blood in your urine.  You are not urinating as often as  you usually do. Get help right away if:  You pain is severe and not relieved with medicines.  You are unable to hold down any fluids.  You or someone else notices a change in your mental function.  You have a rapid heartbeat at rest.  You have shaking or chills.  You feel extremely weak. This information is not intended to replace advice given to you by your health care provider. Make sure you discuss any questions you have with your health care provider. Document Released: 04/20/2004 Document Revised: 12/29/2015 Document Reviewed: 03/18/2014 Elsevier Interactive Patient Education  [employer].

## 2017-08-31 NOTE — Progress Notes (Signed)
Patient ID: Kristen [name], female    DOB: 09/26/1970, 47 y.o.   MRN: [id number]  PCP: Doreene Nest, NP  Chief Complaint  Patient presents with  . Dysuria    x 1 week, burning, cloudy pee  . Urinary Frequency    x 1 week  . Nausea    x1 week, off and on    Subjective:  HPI Kristen [name] is a 47 y.o. female presents for evaluation dysuria, cloudy urine, and urine frequency. She has no history of frequent UTI. Onset of symptoms: 1 week. Denies associated vaginal discharge, vaginal itching, fever, chills, flank or back pain. She has experienced intermittent nausea without vomiting. Admits to poor water intake.  Social History   Socioeconomic History  . Marital status: Legally Separated    Spouse name: Not on file  . Number of children: 3  . Years of education: 14  . Highest education level: Not on file  Social Needs  . Financial resource strain: Not on file  . Food insecurity - worry: Not on file  . Food insecurity - inability: Not on file  . Transportation needs - medical: Not on file  . Transportation needs - non-medical: Not on file  Occupational History    Comment: [employer], [employer] Coach  Tobacco Use  . Smoking status: Never Smoker  . Smokeless tobacco: Never Used  Substance and Sexual Activity  . Alcohol use: No    Alcohol/week: 0.0 oz  . Drug use: No  . Sexual activity: Not Currently    Partners: Male    Birth control/protection: Surgical  Other Topics Concern  . Not on file  Social History Narrative   Separated.    3 children.    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     Family History  Problem Relation Age of Onset  . Hypertension Mother   . Depression Mother   . Diabetes Maternal Grandmother   . Hypertension Maternal Grandmother   . Colon cancer Father 60  . Diabetes Brother   . Stomach cancer Neg Hx     Review of Systems Pertinent information list in HPI  Patient Active Problem  List   Diagnosis Date Noted  . Hot flashes 05/28/2017  . Obesity (BMI 30-39.9) 09/03/2016  . Intracranial hypertension, benign 06/22/2016  . Visual disturbance 05/18/2016  . Other fatigue 03/01/2016  . Preventative health care 01/03/2016  . Insomnia 11/29/2015  . Essential hypertension 11/11/2015  . GERD (gastroesophageal reflux disease) 11/11/2015    Allergies  Allergen Reactions  . Amoxicillin Nausea Only  . Doxycycline   . Penicillin G Rash    Prior to Admission medications   Medication Sig Start Date End Date Taking? Authorizing Provider  estradiol (VIVELLE-DOT) 0.1 MG/24HR patch Place 1 patch (0.1 mg total) onto the skin 2 (two) times a week. 08/15/17  Yes Jerene Bears, MD  hydrochlorothiazide (HYDRODIURIL) 12.5 MG tablet Take 12.5 mg by mouth 2 (two) times daily.   Yes [provider]  pantoprazole (PROTONIX) 20 MG tablet Take 1 tablet (20 mg total) by mouth daily. 05/13/17  Yes Doreene Nest, NP  temazepam (RESTORIL) 15 MG capsule Take 1 capsule (15 mg total) by mouth at bedtime as needed for sleep. 05/13/17  Yes Jerene Bears, MD  doxycycline (VIBRAMYCIN) 100 MG capsule Take 1 capsule (100 mg total) by mouth 2 (two) times daily. Patient not taking: Reported on 08/31/2017 07/11/17   Adriana Simas,  Verdis FredericksonJayce G, DO    Past Medical, Surgical Family and Social History reviewed and updated.    Objective:   Today's Vitals   08/31/17 1241  BP: 130/90  Pulse: 78  Resp: 16  Temp: 98.1 F (36.7 C)  TempSrc: Oral  SpO2: 95%  Weight: 216 lb 3.2 oz (98.1 kg)    Wt Readings from Last 3 Encounters:  08/31/17 216 lb 3.2 oz (98.1 kg)  07/11/17 215 lb (97.5 kg)  05/28/17 215 lb 12.8 oz (97.9 kg)    Physical Exam  Constitutional: She appears well-developed and well-nourished.  Cardiovascular: Normal rate.  Pulmonary/Chest: Effort normal.  Psychiatric: She has a normal mood and affect. Her behavior is normal. Judgment and thought content normal.  Urine dipstick shows  negative for all components.   Assessment & Plan:  1. Dysuria 2. Urine frequency 3. Nausea  UA negative. Urine culture is not available in clinic. Will treat symptom management only and patient should follow-up ASAP with her PCP.   Meds ordered this encounter  Medications  . phenazopyridine (PYRIDIUM) 100 MG tablet    Sig: Take 1 tablet (100 mg total) by mouth 3 (three) times daily as needed for pain.    Dispense:  30 tablet    Refill:  0  . ondansetron (ZOFRAN) 4 MG tablet    Sig: Take 1 tablet (4 mg total) by mouth every 8 (eight) hours as needed for nausea or vomiting.    Dispense:  20 tablet    Refill:  0      If symptoms worsen or do not improve, return for follow-up, follow-up with PCP, or at the emergency department if severity of symptoms warrant a higher level of care.   Godfrey PickKimberly S. Tiburcio PeaHarris, MSN, FNP-C,  [facility], [id number] [place], [place] [address]  [phone]

## 2017-09-04 ENCOUNTER — Encounter: Payer: Self-pay | Admitting: Primary Care

## 2017-09-04 ENCOUNTER — Ambulatory Visit (INDEPENDENT_AMBULATORY_CARE_PROVIDER_SITE_OTHER): Payer: No Typology Code available for payment source | Admitting: Primary Care

## 2017-09-04 VITALS — BP 122/82 | HR 65 | Temp 98.4°F | Ht 62.0 in | Wt 216.2 lb

## 2017-09-04 DIAGNOSIS — E669 Obesity, unspecified: Secondary | ICD-10-CM | POA: Diagnosis not present

## 2017-09-04 DIAGNOSIS — Z90710 Acquired absence of both cervix and uterus: Secondary | ICD-10-CM | POA: Diagnosis not present

## 2017-09-04 DIAGNOSIS — R079 Chest pain, unspecified: Secondary | ICD-10-CM | POA: Diagnosis not present

## 2017-09-04 NOTE — Assessment & Plan Note (Signed)
Discussed the importance of a healthy diet and regular exercise in order for weight loss, and to reduce the risk of any potential medical problems.  

## 2017-09-04 NOTE — Assessment & Plan Note (Addendum)
Intermittent for the past 1 month. She declines EKG today as she does not have enough time, she will get this done tomorrow in the clinic. Could be secondary to stress, but given risk factors it would be reasonable to undergo further evaluation including stress test.  Discussed risk factors associated with heart disease including obesity and hypertension.  Recommended she start exercising and improve her diet.  Lipid panel in October 2018 unremarkable.  Referral placed to cardiology as requested.

## 2017-09-04 NOTE — Progress Notes (Signed)
Subjective:    Patient ID: Kristen [name], female    DOB: 16-Feb-1971, 47 y.o.   MRN: [id number]  HPI  Ms. Kristen [name] is a 47 year old female with a history of hypertension, obesity, GERD who presents today requesting a referral to cardiology.  She is also needing a referral to see her gynecologist for follow-up.  She's experiencing chest discomfort to her left upper chest. Her symptoms are intermittent and began one month ago for which she describes as "discomfort". She notices her discomfort at rest only.  She is worried given her history of hypertension, race, obesity and she like to be evaluated by cardiologist for peace of mind.  She doesn't think this feels like acid reflux and she's already taking Protonix. She denies dizziness, numbness/tingling, radiation of pain, recent injury or trauma, shortness of breath. She's been under a lot of stress as her mother is living with her. She is not exercising.  Diet currently consists of:  Breakfast: Amino acid drink, Kind granola bar Lunch: Taco bake (low carb), some fast food Dinner: Fast food Snacks: Veggie straw chips Desserts: Occasionally, twice weekly Beverages: Water, occasional sweet tea  Exercise: She is not currently exercising.   Review of Systems  Respiratory: Negative for shortness of breath.   Cardiovascular:       Left upper chest discomfort.  Gastrointestinal: Negative for abdominal pain.  Musculoskeletal: Negative for myalgias.  Neurological: Negative for dizziness and headaches.       Past Medical History:  Diagnosis Date  . Abnormal Pap smear of cervix    dysplasia   . Anemia    history of  . Anxiety   . Asthma    seasonal asthma, has not required use of inhaler in over 2 years  . Cervical dysplasia 1993  . Depression   . Diverticulitis   . Dysmenorrhea   . Essential hypertension   . GERD (gastroesophageal reflux disease)   . Headache    history of migraines  . IBS (irritable bowel syndrome)   . STD  (sexually transmitted disease) ~1998   chlamydia      Social History   Socioeconomic History  . Marital status: Legally Separated    Spouse name: Not on file  . Number of children: 3  . Years of education: 4218  . Highest education level: Not on file  Social Needs  . Financial resource strain: Not on file  . Food insecurity - worry: Not on file  . Food insecurity - inability: Not on file  . Transportation needs - medical: Not on file  . Transportation needs - non-medical: Not on file  Occupational History    Comment: [employer], [employer]  Tobacco Use  . Smoking status: Never Smoker  . Smokeless tobacco: Never Used  Substance and Sexual Activity  . Alcohol use: No    Alcohol/week: 0.0 oz  . Drug use: No  . Sexual activity: Not Currently    Partners: Male    Birth control/protection: Surgical  Other Topics Concern  . Not on file  Social History Narrative   Separated.    3 children.    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     Past Surgical History:  Procedure Laterality Date  . ADENOIDECTOMY     lymph nodes removed  . APPENDECTOMY  1990  . COLONOSCOPY    . COLPOSCOPY  1992  . CYSTOSCOPY N/A 02/12/2017   Procedure: CYSTOSCOPY;  Surgeon: Jerene Bears, MD;  Location: [facility];  Service: Gynecology;  Laterality: N/A;  . TONSILLECTOMY    . TOTAL LAPAROSCOPIC HYSTERECTOMY WITH SALPINGECTOMY Bilateral 02/12/2017   Procedure: HYSTERECTOMY TOTAL LAPAROSCOPIC WITH SALPINGECTOMY;  Surgeon: Jerene Bears, MD;  Location: [facility];  Service: Gynecology;  Laterality: Bilateral;  . TUBAL LIGATION      Family History  Problem Relation Age of Onset  . Hypertension Mother   . Depression Mother   . Diabetes Maternal Grandmother   . Hypertension Maternal Grandmother   . Colon cancer Father 19  . Diabetes Brother   . Stomach cancer Neg Hx     Allergies  Allergen Reactions  . Amoxicillin Nausea Only    Has patient had  a PCN reaction causing immediate rash, facial/tongue/throat swelling, SOB or lightheadedness with hypotension: No Has patient had a PCN reaction causing severe rash involving mucus membranes or skin necrosis: No Has patient had a PCN reaction that required hospitalization: No Has patient had a PCN reaction occurring within the last 10 years: Yes If all of the above answers are "NO", then may proceed with Cephalosporin use.   Marland Kitchen Doxycycline   . Penicillin G Rash    Has patient had a PCN reaction causing immediate rash, facial/tongue/throat swelling, SOB or lightheadedness with hypotension: Unknown Has patient had a PCN reaction causing severe rash involving mucus membranes or skin necrosis: Unknown Has patient had a PCN reaction that required hospitalization: Unknown Has patient had a PCN reaction occurring within the last 10 years: No If all of the above answers are "NO", then may proceed with Cephalosporin use.     Current Outpatient Medications on File Prior to Visit  Medication Sig Dispense Refill  . estradiol (VIVELLE-DOT) 0.1 MG/24HR patch Place 1 patch (0.1 mg total) onto the skin 2 (two) times a week. 24 patch 3  . hydrochlorothiazide (HYDRODIURIL) 12.5 MG tablet Take 12.5 mg by mouth 2 (two) times daily.    . pantoprazole (PROTONIX) 20 MG tablet Take 1 tablet (20 mg total) by mouth daily. 90 tablet 0  . temazepam (RESTORIL) 15 MG capsule Take 1 capsule (15 mg total) by mouth at bedtime as needed for sleep. 30 capsule 1   No current facility-administered medications on file prior to visit.     BP 122/82   Pulse 65   Temp 98.4 F (36.9 C) (Oral)   Ht 5\' 2"  (1.575 m)   Wt 216 lb 4 oz (98.1 kg)   LMP 01/27/2017 (Exact Date)   SpO2 98%   BMI 39.55 kg/m    Objective:   Physical Exam  Constitutional: She appears well-nourished.  Neck: Neck supple.  Cardiovascular: Normal rate and regular rhythm.  Pulmonary/Chest: Effort normal and breath sounds normal.  Musculoskeletal:    Anterior chest wall nontender to palpation  Skin: Skin is warm and dry.          Assessment & Plan:

## 2017-09-04 NOTE — Patient Instructions (Addendum)
You will be contacted regarding your referral to cardiology.  Schedule an appointment with your gynecologist.  Please let us know if you have not been contacted within one week.   Start exercising. You should be getting 150 minutes of moderate intensity exercise weekly.  Stop eating fast/fried/fatty food. Increase fresh fruits, vegetables, whole grains.  Please get the ECG completed at your convenience.  It was a pleasure to see you today!

## 2017-09-11 ENCOUNTER — Ambulatory Visit (INDEPENDENT_AMBULATORY_CARE_PROVIDER_SITE_OTHER): Payer: No Typology Code available for payment source | Admitting: Primary Care

## 2017-09-11 DIAGNOSIS — R079 Chest pain, unspecified: Secondary | ICD-10-CM | POA: Diagnosis not present

## 2017-09-11 NOTE — Progress Notes (Signed)
Patient ID: Kristen [name], female   DOB: Jun 06, 1971, 47 y.o.   MRN: [id number]  ECG. No visit today.

## 2017-09-13 ENCOUNTER — Encounter: Payer: Self-pay | Admitting: Obstetrics & Gynecology

## 2017-09-13 ENCOUNTER — Ambulatory Visit: Payer: No Typology Code available for payment source | Admitting: Obstetrics & Gynecology

## 2017-09-13 VITALS — BP 128/80 | HR 80 | Wt 216.0 lb

## 2017-09-13 DIAGNOSIS — R4586 Emotional lability: Secondary | ICD-10-CM

## 2017-09-13 DIAGNOSIS — R6882 Decreased libido: Secondary | ICD-10-CM | POA: Diagnosis not present

## 2017-09-13 DIAGNOSIS — N898 Other specified noninflammatory disorders of vagina: Secondary | ICD-10-CM

## 2017-09-13 MED ORDER — FLUOXETINE HCL 10 MG PO TABS
10.0000 mg | ORAL_TABLET | Freq: Every day | ORAL | 4 refills | Status: DC
Start: 2017-09-13 — End: 2018-03-14

## 2017-09-13 NOTE — Progress Notes (Signed)
GYNECOLOGY  VISIT  CC:   Six month follow up  HPI: 47 y.o. [phone] [name] here for follow-up.  Was started on HRT after hysterectomy--initially on oral estradiol.  Does feel her breasts have gotten larger.    Hot flashes are very rare.  Uses Vivelle dot and this has worked really well for her.  Sleep is good.    Feels she is still moody.  Tried Effexor and felt "zombie-like" at times.  Would like to consider other options.  Is aware of other folks having some success with prozac.  Symptoms seem cyclical for the patient and she would like to consider trying Prozac in that manner.  Also having some vaginal discharge and some odor.  Not currently SA.  Was treated for yeast in October.  Feels like this is yeast again.  Also little concerned about libido.  She is not currently sexually active and is not actively looking but is concerned that she has no desire.  GYNECOLOGIC HISTORY: Patient's last menstrual period was 01/27/2017 (exact date). Contraception: Hysterectomy Menopausal hormone therapy: Estradiol patch 0.1 mg  Patient Active Problem List   Diagnosis Date Noted  . Chest pain 09/04/2017  . Hot flashes 05/28/2017  . Obesity (BMI 30-39.9) 09/03/2016  . Intracranial hypertension, benign 06/22/2016  . Visual disturbance 05/18/2016  . Other fatigue 03/01/2016  . Preventative health care 01/03/2016  . Insomnia 11/29/2015  . Essential hypertension 11/11/2015  . GERD (gastroesophageal reflux disease) 11/11/2015    Past Medical History:  Diagnosis Date  . Abnormal Pap smear of cervix    dysplasia   . Anemia    history of  . Anxiety   . Asthma    seasonal asthma, has not required use of inhaler in over 2 years  . Cervical dysplasia 1993  . Depression   . Diverticulitis   . Dysmenorrhea   . Essential hypertension   . GERD (gastroesophageal reflux disease)   . Headache    history of migraines  . IBS (irritable bowel syndrome)   . STD  (sexually transmitted disease) ~1998   chlamydia     Past Surgical History:  Procedure Laterality Date  . ADENOIDECTOMY     lymph nodes removed  . APPENDECTOMY  1990  . COLONOSCOPY    . COLPOSCOPY  1992  . CYSTOSCOPY N/A 02/12/2017   Procedure: CYSTOSCOPY;  Surgeon: Jerene BearsMiller, Aracelie Addis S, MD;  Location: [facility];  Service: Gynecology;  Laterality: N/A;  . TONSILLECTOMY    . TOTAL LAPAROSCOPIC HYSTERECTOMY WITH SALPINGECTOMY Bilateral 02/12/2017   Procedure: HYSTERECTOMY TOTAL LAPAROSCOPIC WITH SALPINGECTOMY;  Surgeon: Jerene BearsMiller, Juwon Scripter S, MD;  Location: [facility];  Service: Gynecology;  Laterality: Bilateral;  . TUBAL LIGATION      MEDS:   Current Outpatient Medications on File Prior to Visit  Medication Sig Dispense Refill  . estradiol (VIVELLE-DOT) 0.1 MG/24HR patch Place 1 patch (0.1 mg total) onto the skin 2 (two) times a week. 24 patch 3  . hydrochlorothiazide (HYDRODIURIL) 12.5 MG tablet Take 12.5 mg by mouth 2 (two) times daily.    . pantoprazole (PROTONIX) 20 MG tablet Take 1 tablet (20 mg total) by mouth daily. 90 tablet 0  . temazepam (RESTORIL) 15 MG capsule Take 1 capsule (15 mg total) by mouth at bedtime as needed for sleep. 30 capsule 1   No current facility-administered medications on file prior to visit.     ALLERGIES: Amoxicillin; Doxycycline; and Penicillin g  Family History  Problem Relation Age of  Onset  . Hypertension Mother   . Depression Mother   . Diabetes Maternal Grandmother   . Hypertension Maternal Grandmother   . Colon cancer Father 59  . Diabetes Brother   . Stomach cancer Neg Hx     SH: Separated, non-smoker  Review of Systems  Constitutional: Negative for chills, fever, malaise/fatigue and weight loss.  Gastrointestinal: Negative.   Genitourinary: Negative for dysuria, frequency, hematuria and urgency.  Psychiatric/Behavioral: Negative for depression, hallucinations and suicidal ideas. The patient is not nervous/anxious and does not have insomnia.      PHYSICAL EXAMINATION:    BP 128/80 (BP Location: Right Arm, Patient Position: Sitting, Cuff Size: Normal)   Pulse 80   Wt 216 lb (98 kg)   LMP 01/27/2017 (Exact Date)   BMI 39.51 kg/m     General appearance: alert, cooperative and appears stated age Abdomen: soft, non-tender; bowel sounds normal; no masses,  no organomegaly  Pelvic: External genitalia:  no lesions              Urethra:  normal appearing urethra with no masses, tenderness or lesions              Bartholins and Skenes: normal                 Vagina: normal appearing vagina with normal color and whitish discharge present, no lesions              Cervix: absent              Bimanual Exam:  Uterus:  uterus absent              Adnexa: no mass, fullness, tenderness   Chaperone was present for exam.  Assessment: Vaginal discharge Moodiness Decreased libido   Plan: Vaginitis testing obtained today. Total testosterone obtained.  If this is low I will consider starting topical testosterone for this patient Prozac 10 mg daily versus 15 days of the month discussed.  She is going to start with half of the month directions for use were provided.  She will start for 15 days after symptoms start to improve. Progesterone level obtained.  If this is low, I would consider adding progesterone to her hormone replacement therapy regimen.  ~20 minutes spent with patient >50% of time was in face to face discussion of above.

## 2017-09-14 LAB — VAGINITIS/VAGINOSIS, DNA PROBE
Candida Species: POSITIVE — AB
Gardnerella vaginalis: NEGATIVE
Trichomonas vaginosis: NEGATIVE

## 2017-09-16 NOTE — Progress Notes (Signed)
Referring-Katherine Clark NP Reason for referral-chest pain  HPI: 47 year old female for evaluation of chest pain at request of Vernona RiegerKatherine Clark NP.  She describes intermittent chest pain since November.  The pain is in the left upper chest and described as a cramping sensation.  It does not radiate.  No associated dyspnea, nausea or diaphoresis.  Not exertional, pleuritic or positional.  Typically lasts 30 seconds and resolves spontaneously.  She has some dyspnea on exertion but no orthopnea, PND, pedal edema and no history of syncope.  Because of the above cardiology asked to evaluate.  Current Outpatient Medications  Medication Sig Dispense Refill  . estradiol (VIVELLE-DOT) 0.1 MG/24HR patch Place 1 patch (0.1 mg total) onto the skin 2 (two) times a week. 24 patch 3  . fluconazole (DIFLUCAN) 150 MG tablet Take one tablet po q72 hours for 3 doses. 3 tablet 0  . FLUoxetine (PROZAC) 10 MG tablet Take 1 tablet (10 mg total) by mouth daily. 90 tablet 4  . hydrochlorothiazide (HYDRODIURIL) 12.5 MG tablet Take 12.5 mg by mouth 2 (two) times daily.    . pantoprazole (PROTONIX) 20 MG tablet Take 1 tablet (20 mg total) by mouth daily. 90 tablet 0  . progesterone (PROMETRIUM) 100 MG capsule Take 1 capsule (100 mg total) by mouth at bedtime. 90 capsule 0  . temazepam (RESTORIL) 15 MG capsule Take 1 capsule (15 mg total) by mouth at bedtime as needed for sleep. 30 capsule 1   No current facility-administered medications for this visit.     Allergies  Allergen Reactions  . Amoxicillin Nausea Only    Has patient had a PCN reaction causing immediate rash, facial/tongue/throat swelling, SOB or lightheadedness with hypotension: No Has patient had a PCN reaction causing severe rash involving mucus membranes or skin necrosis: No Has patient had a PCN reaction that required hospitalization: No Has patient had a PCN reaction occurring within the last 10 years: Yes If all of the above answers are "NO",  then may proceed with Cephalosporin use.   Marland Kitchen. Doxycycline   . Penicillin G Rash    Has patient had a PCN reaction causing immediate rash, facial/tongue/throat swelling, SOB or lightheadedness with hypotension: Unknown Has patient had a PCN reaction causing severe rash involving mucus membranes or skin necrosis: Unknown Has patient had a PCN reaction that required hospitalization: Unknown Has patient had a PCN reaction occurring within the last 10 years: No If all of the above answers are "NO", then may proceed with Cephalosporin use.      Past Medical History:  Diagnosis Date  . Abnormal Pap smear of cervix    dysplasia   . Anemia    history of  . Anxiety   . Asthma    seasonal asthma, has not required use of inhaler in over 2 years  . Cervical dysplasia 1993  . Depression   . Diverticulitis   . Dysmenorrhea   . Essential hypertension   . GERD (gastroesophageal reflux disease)   . Headache    history of migraines  . IBS (irritable bowel syndrome)   . STD (sexually transmitted disease) ~1998   chlamydia     Past Surgical History:  Procedure Laterality Date  . ADENOIDECTOMY     lymph nodes removed  . APPENDECTOMY  1990  . COLONOSCOPY    . COLPOSCOPY  1992  . CYSTOSCOPY N/A 02/12/2017   Procedure: CYSTOSCOPY;  Surgeon: Jerene BearsMiller, Mary S, MD;  Location: [facility];  Service: Gynecology;  Laterality: N/A;  .  TONSILLECTOMY    . TOTAL LAPAROSCOPIC HYSTERECTOMY WITH SALPINGECTOMY Bilateral 02/12/2017   Procedure: HYSTERECTOMY TOTAL LAPAROSCOPIC WITH SALPINGECTOMY;  Surgeon: Jerene Bears, MD;  Location: [facility];  Service: Gynecology;  Laterality: Bilateral;  . TUBAL LIGATION      Social History   Socioeconomic History  . Marital status: Legally Separated    Spouse name: Not on file  . Number of children: 3  . Years of education: 20  . Highest education level: Not on file  Social Needs  . Financial resource strain: Not on file  . Food insecurity - worry: Not on file  . Food  insecurity - inability: Not on file  . Transportation needs - medical: Not on file  . Transportation needs - non-medical: Not on file  Occupational History    Comment:  [employer], [employer]  Tobacco Use  . Smoking status: Never Smoker  . Smokeless tobacco: Never Used  Substance and Sexual Activity  . Alcohol use: No    Alcohol/week: 0.0 oz  . Drug use: No  . Sexual activity: Not Currently    Partners: Male    Birth control/protection: Surgical  Other Topics Concern  . Not on file  Social History Narrative   Separated.    3 children.    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     Family History  Problem Relation Age of Onset  . Hypertension Mother   . Depression Mother   . Diabetes Maternal Grandmother   . Hypertension Maternal Grandmother   . Colon cancer Father 6  . Diabetes Brother   . Stomach cancer Neg Hx     ROS: no fevers or chills, productive cough, hemoptysis, dysphasia, odynophagia, melena, hematochezia, dysuria, hematuria, rash, seizure activity, orthopnea, PND, pedal edema, claudication. Remaining systems are negative.  Physical Exam:   Blood pressure 128/66, pulse 68, height 5\' 2"  (1.575 m), weight 215 lb (97.5 kg), last menstrual period 01/27/2017.  General:  Well developed/well nourished in NAD Skin warm/dry Patient not depressed No peripheral clubbing Back-normal HEENT-normal/normal eyelids Neck supple/normal carotid upstroke bilaterally; no bruits; no JVD; no thyromegaly chest - CTA/ normal expansion CV - RRR/normal S1 and S2; no murmurs, rubs or gallops;  PMI nondisplaced Abdomen -NT/ND, no HSM, no mass, + bowel sounds, no bruit 2+ femoral pulses, no bruits Ext-no edema, chords, 2+ DP Neuro-grossly nonfocal  ECG -September 11, 2017-sinus rhythm with no ST changes.  Personally reviewed  ECG today shows NSR, no ST changes  A/P  1 chest pain-symptoms are atypical.  Electrocardiogram is  normal.  I will arrange an exercise treadmill for risk stratification.  We will arrange an echocardiogram to assess LV function and wall motion.  2 dyspnea-some dyspnea on exertion that she attributes to deconditioning.  We will arrange an echocardiogram to further assess LV function.    3 Hypertension-blood pressure is controlled on today's readings.  She states it typically runs higher at home.  Continue hydrochlorothiazide.  Follow blood pressure and add medications as needed.  4 obesity-we discussed importance of diet and exercise.  Olga Millers, MD

## 2017-09-18 LAB — TESTOSTERONE, TOTAL, LC/MS/MS: Testosterone, total: 14.4 ng/dL

## 2017-09-18 LAB — PROGESTERONE: Progesterone: 0.1 ng/mL

## 2017-09-20 ENCOUNTER — Other Ambulatory Visit: Payer: Self-pay | Admitting: *Deleted

## 2017-09-20 ENCOUNTER — Telehealth: Payer: Self-pay | Admitting: *Deleted

## 2017-09-20 MED ORDER — FLUCONAZOLE 150 MG PO TABS: ORAL_TABLET | ORAL | 0 refills | Status: DC

## 2017-09-20 NOTE — Progress Notes (Signed)
Please let pt know that both testosterone and progesterone were low.  If she wants to add progesterone or testosterone to her regimen, I can change the HRT that she is taking.  I would add one first to make sure doesn't have any side effects.    Vaginitis testing showed yeast.  Ok to treat with Diflucan 150mg  po every 72 hours for 3 doses.  #3/0RF.  Can also use Terazol 7 nightly x 7 nights.  If has yeast again, will need to do treatment for recurrent yeast.  She and I discussed this at last visit.

## 2017-09-20 NOTE — Telephone Encounter (Signed)
-----   Message from Jerene BearsMary S Miller, MD sent at 09/20/2017  4:19 PM EST ----- Please let pt know that both testosterone and progesterone were low.  If she wants to add progesterone or testosterone to her regimen, I can change the HRT that she is taking.  I would add one first to make sure doesn't have any side effects.    Vaginitis testing showed yeast.  Ok to treat with Diflucan 150mg  po every 72 hours for 3 doses.  #3/0RF.  Can also use Terazol 7 nightly x 7 nights.  If has yeast again, will need to do treatment for recurrent yeast.  She and I discussed this at last visit.

## 2017-09-20 NOTE — Telephone Encounter (Signed)
Notes recorded by Leda MinHamm, Torah Pinnock N, RN on 09/20/2017 at 5:01 PM EST Spoke with patient, advised as seen below per Dr. Hyacinth MeekerMiller. Patient request to add progesterone first, would like to wait to add testosterone as she already has problems with acne. [facility]. Advised patient will review with Dr. Hyacinth MeekerMiller and return call with recommendations for progesterone, patient verbalizes understanding and is agreeable. See telephone encounter created to review with provider.   Dr. Hyacinth MeekerMiller, please advise on progesterone RX?

## 2017-09-24 MED ORDER — PROGESTERONE MICRONIZED 100 MG PO CAPS: 100.0000 mg | ORAL_CAPSULE | Freq: Every day | ORAL | 0 refills | Status: DC

## 2017-09-24 NOTE — Telephone Encounter (Signed)
Spoke with patient, advised as seen below per Dr. Hyacinth MeekerMiller. Patient asking if she will continue estradiol patch?   Advised will continue estradiol patch and add Prometrium nightly. Patient verbalizes understanding and is agreeable.   Rx for Prometrium to verified pharmacy.   Routing to provider for final review. Patient is agreeable to disposition. Will close encounter.

## 2017-09-24 NOTE — Telephone Encounter (Signed)
I would start with Prometrium 100mg  nightly.  Ok to send in rx for 90 day supply.

## 2017-09-26 ENCOUNTER — Ambulatory Visit: Payer: No Typology Code available for payment source | Admitting: Cardiology

## 2017-09-26 ENCOUNTER — Encounter: Payer: Self-pay | Admitting: Cardiology

## 2017-09-26 VITALS — BP 128/66 | HR 68 | Ht 62.0 in | Wt 215.0 lb

## 2017-09-26 DIAGNOSIS — R072 Precordial pain: Secondary | ICD-10-CM | POA: Diagnosis not present

## 2017-09-26 DIAGNOSIS — I10 Essential (primary) hypertension: Secondary | ICD-10-CM

## 2017-09-26 DIAGNOSIS — R0600 Dyspnea, unspecified: Secondary | ICD-10-CM | POA: Diagnosis not present

## 2017-09-26 NOTE — Patient Instructions (Addendum)
Medication Instructions:   NO CHANGE  Testing/Procedures:  Your physician has requested that you have an exercise tolerance test. For further information please visit [email or web address]. Please also follow instruction sheet, as given.   Your physician has requested that you have an echocardiogram. Echocardiography is a painless test that uses sound waves to create images of your heart. It provides your doctor with information about the size and shape of your heart and how well your heart's chambers and valves are working. This procedure takes approximately one hour. There are no restrictions for this procedure.    Follow-Up:  Your physician wants you to follow-up in: ONE YEAR WITH DR Shelda PalRENSHAW You will receive a reminder letter in the mail two months in advance. If you don't receive a letter, please call our office to schedule the follow-up appointment.

## 2017-10-03 ENCOUNTER — Telehealth (HOSPITAL_COMMUNITY): Payer: Self-pay

## 2017-10-03 NOTE — Telephone Encounter (Signed)
Encounter complete. 

## 2017-10-04 ENCOUNTER — Other Ambulatory Visit: Payer: Self-pay

## 2017-10-04 ENCOUNTER — Ambulatory Visit (HOSPITAL_COMMUNITY): Payer: No Typology Code available for payment source | Attending: Cardiology

## 2017-10-04 DIAGNOSIS — I10 Essential (primary) hypertension: Secondary | ICD-10-CM | POA: Insufficient documentation

## 2017-10-04 DIAGNOSIS — R072 Precordial pain: Secondary | ICD-10-CM

## 2017-10-04 DIAGNOSIS — J45909 Unspecified asthma, uncomplicated: Secondary | ICD-10-CM | POA: Diagnosis not present

## 2017-10-04 DIAGNOSIS — D649 Anemia, unspecified: Secondary | ICD-10-CM | POA: Insufficient documentation

## 2017-10-04 IMAGING — CT CT ABD-PELV W/ CM
2 of 5 series · 17 of 46 positions shown, 19 images · IV contrast (iopamidol)
Comparison: None.

CLINICAL DATA: Patient with abdominal pain. History of
diverticulitis. Constipation.

EXAM:
CT ABDOMEN AND PELVIS WITH CONTRAST
TECHNIQUE: Multidetector CT imaging of the abdomen and pelvis was performed
using the standard protocol following bolus administration of
intravenous contrast.
CONTRAST:  100mL OXYOID-JNN IOPAMIDOL (OXYOID-JNN) INJECTION 61%

[Series 2: abd/pel w · axial · 0.86mm/px · z∈[+928,+1328]mm · 14 of 90 slices shown, 16 images]
[im 5/90  soft-tissue]
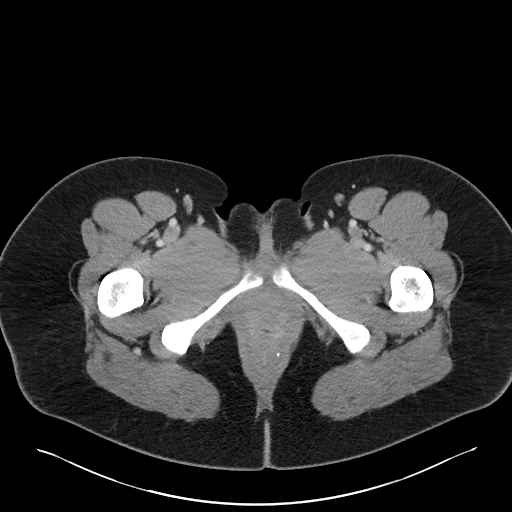
[im 5/90  bone]
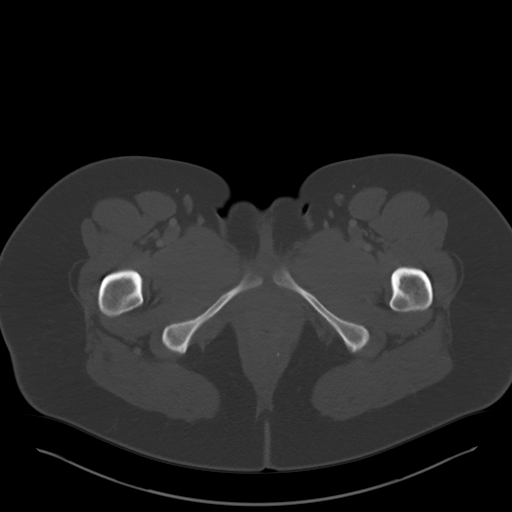
[im 13/90  soft-tissue]
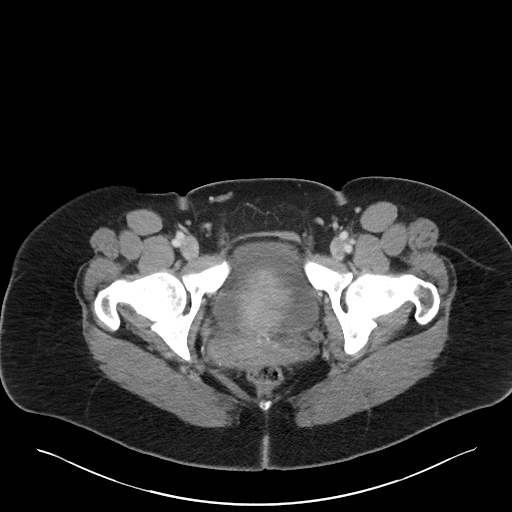
[im 17/90  soft-tissue]
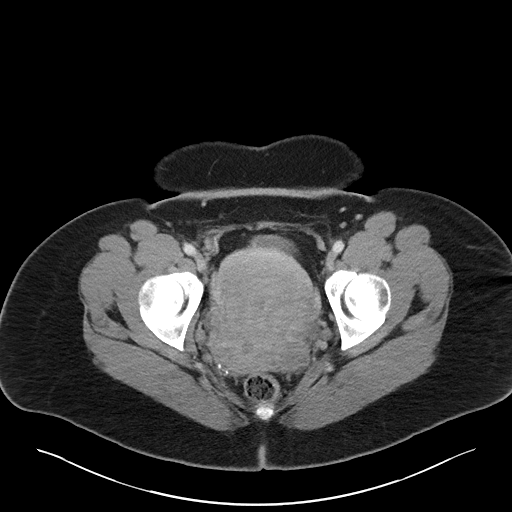
[im 26/90  soft-tissue]
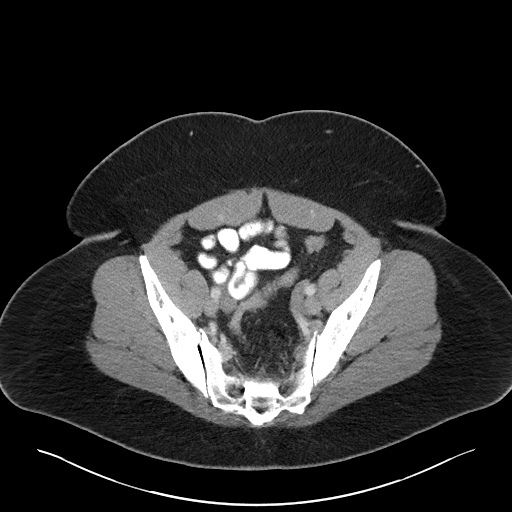
[im 30/90  soft-tissue]
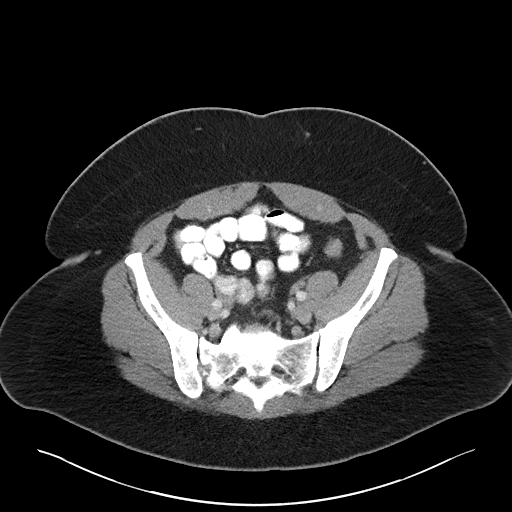
[im 34/90  soft-tissue]
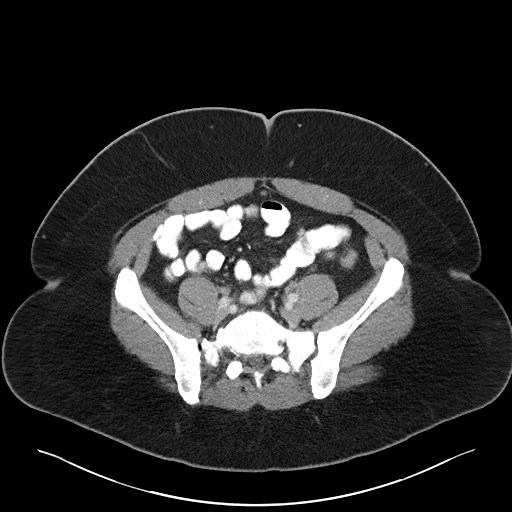
[im 43/90  soft-tissue]
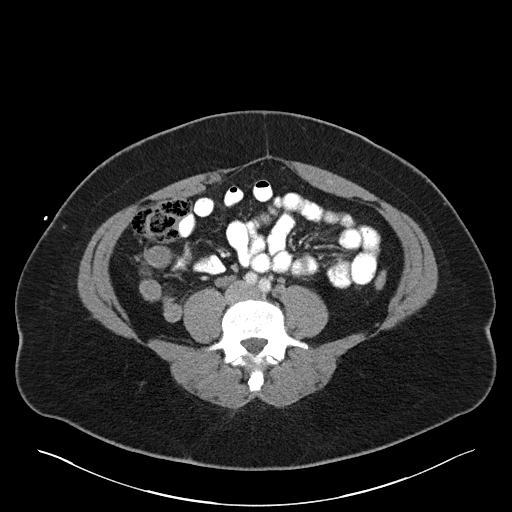
[im 47/90  soft-tissue]
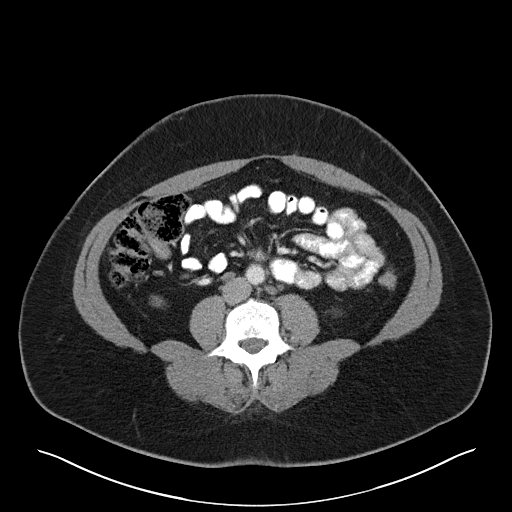
[im 56/90  soft-tissue]
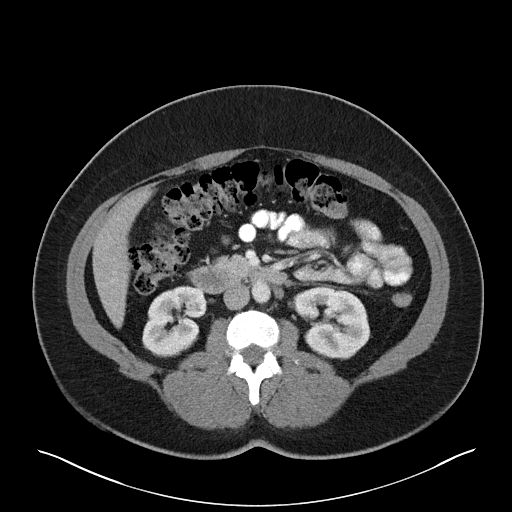
[im 56/90  bone]
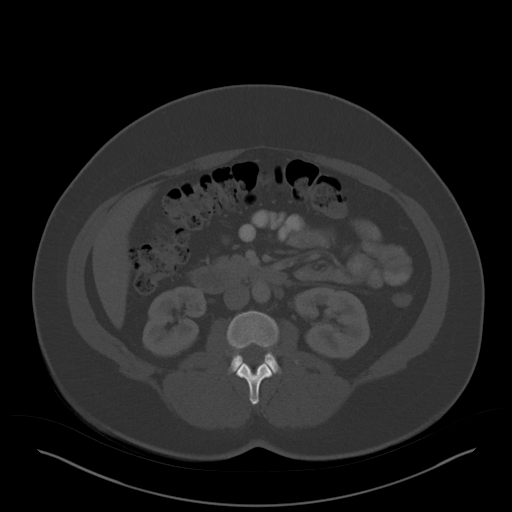
[im 60/90  soft-tissue]
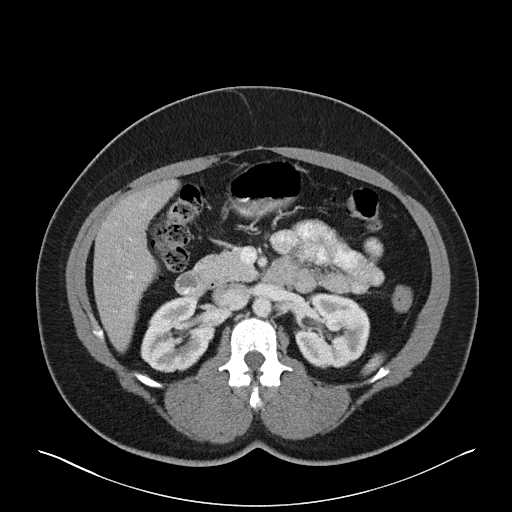
[im 68/90  soft-tissue]
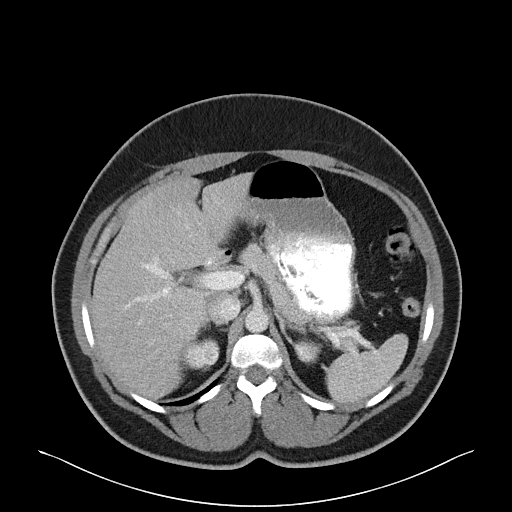
[im 73/90  soft-tissue]
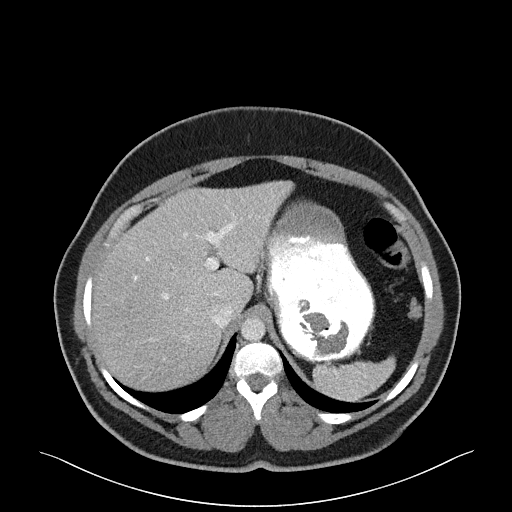
[im 77/90  soft-tissue]
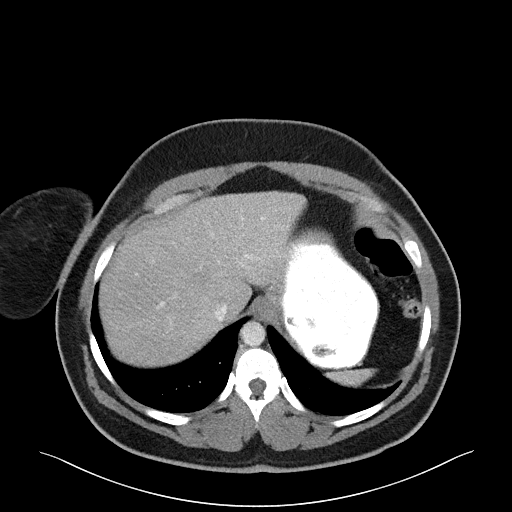
[im 85/90  soft-tissue]
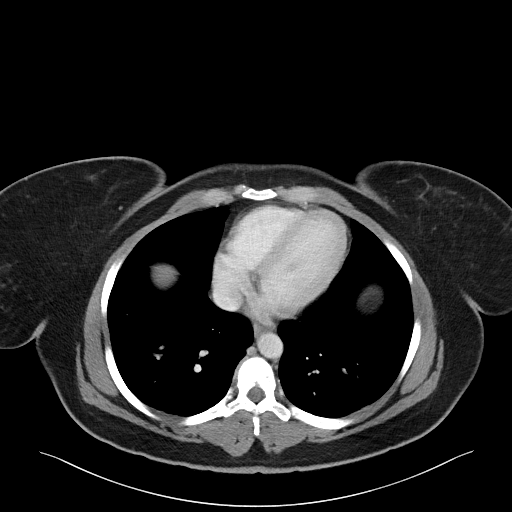

[Series 5: abd/pel w st · coronal · 0.89mm/px · 3 of 101 slices shown]
[im 34/101  soft-tissue]
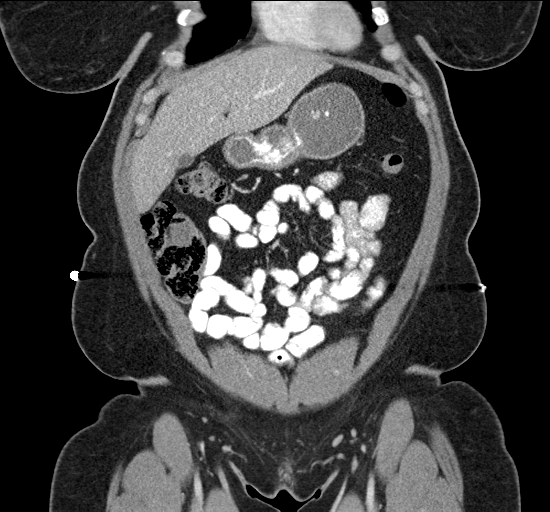
[im 45/101  soft-tissue]
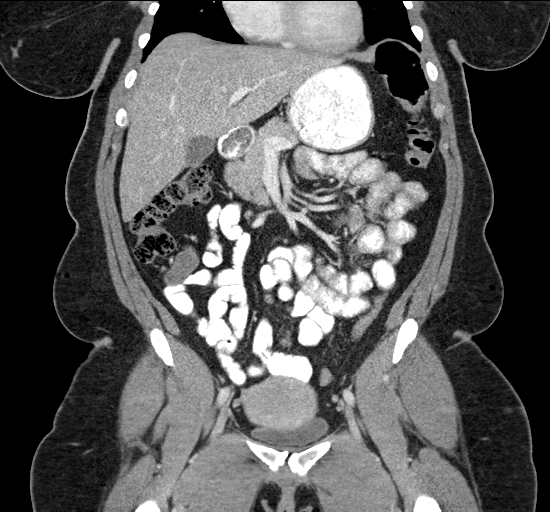
[im 56/101  soft-tissue]
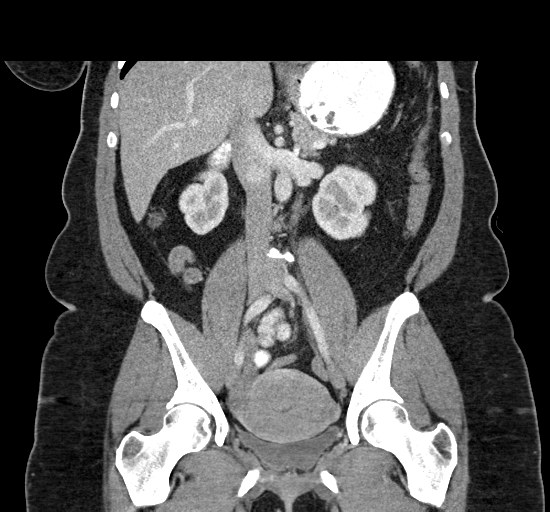

[17 of 46 positions shown; findings below may reference images not displayed]

FINDINGS: Lower chest: Normal heart size. Dependent atelectasis within the
bilateral lower lobes. No pleural effusion.

Hepatobiliary: Liver is normal in size and contour. Fatty deposition
adjacent to the falciform ligament. Gallbladder is unremarkable.

Pancreas: Unremarkable

Spleen: Unremarkable

Adrenals/Urinary Tract: Adrenal glands are normal. Kidneys are
lobular in contour. No hydronephrosis. Urinary bladder is
unremarkable.

Stomach/Bowel: No abnormal bowel wall thickening or evidence for
bowel obstruction. No free fluid or free intraperitoneal air. Normal
morphology of the stomach.

Vascular/Lymphatic: Normal caliber abdominal aorta. No
retroperitoneal lymphadenopathy.

Reproductive: Uterus is unremarkable. The ovaries are enlarged
bilaterally. The left ovary measures 7.4 x 3.1 cm and contains a
cm intermediate density cystic lesion. The right ovary is enlarged
measuring 5.4 x 2.6 cm.

Other: None.

Musculoskeletal: No aggressive or acute appearing osseous lesions.
Lumbar spine degenerative changes.
IMPRESSION: The ovaries are enlarged bilaterally. The left ovary contains a 3 cm
cystic structure. Given the overall appearance, these findings need
further evaluation with pelvic ultrasound.

No CT evidence to suggest acute diverticulitis.

## 2017-10-08 ENCOUNTER — Encounter: Payer: Self-pay | Admitting: Cardiology

## 2017-10-09 ENCOUNTER — Ambulatory Visit (HOSPITAL_COMMUNITY)
Admission: RE | Admit: 2017-10-09 | Discharge: 2017-10-09 | Disposition: A | Discharge status: Home / Self Care | Payer: No Typology Code available for payment source | Source: Ambulatory Visit | Attending: Cardiovascular Disease | Admitting: Cardiovascular Disease

## 2017-10-09 DIAGNOSIS — R072 Precordial pain: Secondary | ICD-10-CM | POA: Diagnosis not present

## 2017-10-10 LAB — EXERCISE TOLERANCE TEST
Estimated workload: 9 METS
Exercise duration (min): 7 min
Exercise duration (sec): 18 s
MPHR: 174 {beats}/min
Peak HR: 150 {beats}/min
Percent HR: 86 %
RPE: 18
Rest HR: 73 {beats}/min

## 2017-10-22 ENCOUNTER — Encounter: Payer: Self-pay | Admitting: Obstetrics & Gynecology

## 2017-10-22 ENCOUNTER — Telehealth: Payer: Self-pay | Admitting: Obstetrics & Gynecology

## 2017-10-22 NOTE — Telephone Encounter (Signed)
I think with the bleeding after intercourse she should be seen. They can discuss change in ERT at that visit.

## 2017-10-22 NOTE — Telephone Encounter (Signed)
-----   Message from Mychart, Generic sent at 10/22/2017 3:25 PM EDT -----    Dr. Hyacinth MeekerMiller,    [facility]    First, feedback on Progesterone. Works well. PMDD was virtually non-existent last month. Stopped Prozac.     Second, husband and I have reconciled. Had sex for 1st time since surgery. Afterwards, felt vaginal pain and there was a drop or two of red, bright blood. Have not seen any since 17th.     Third, Vivelle-dot patch is working fine but developing sensitivity on skin from tegaderm that is used to keep patch in place.     Question: Will you please prescribe the estrogen in a cream form? Maybe it will help with the vaginal pain after sex and my skin will stop getting blisters from the tegaderm.     Please let me know any feedback.     Thank you, [name]

## 2017-10-22 NOTE — Telephone Encounter (Signed)
Routing to Dr.Jertson for review and advise as Dr.Miller is out of the office.

## 2018-01-02 ENCOUNTER — Ambulatory Visit (INDEPENDENT_AMBULATORY_CARE_PROVIDER_SITE_OTHER): Payer: No Typology Code available for payment source | Admitting: Primary Care

## 2018-01-02 VITALS — BP 136/86 | HR 73 | Temp 97.9°F | Resp 16 | Ht 62.0 in | Wt 215.5 lb

## 2018-01-02 DIAGNOSIS — R350 Frequency of micturition: Secondary | ICD-10-CM | POA: Diagnosis not present

## 2018-01-02 DIAGNOSIS — N898 Other specified noninflammatory disorders of vagina: Secondary | ICD-10-CM

## 2018-01-02 LAB — POC URINALSYSI DIPSTICK (AUTOMATED)
Bilirubin, UA: NEGATIVE
Blood, UA: NEGATIVE
Glucose, UA: NEGATIVE
Ketones, UA: NEGATIVE
Leukocytes, UA: NEGATIVE
Nitrite, UA: NEGATIVE
Protein, UA: POSITIVE — AB
Spec Grav, UA: >=1.03 — AB (ref 1.010–1.025)
Urobilinogen, UA: 0.2 E.U./dL
pH, UA: 5.5 (ref 5.0–8.0)

## 2018-01-02 MED ORDER — FLUCONAZOLE 150 MG PO TABS: 150.0000 mg | ORAL_TABLET | Freq: Once | ORAL | 0 refills | Status: AC

## 2018-01-02 NOTE — Patient Instructions (Signed)
Take fluconazole (Diflucan) 150 mg tablet for presumed vaginal yeast infection. Please follow up with your gynecologist as discussed.

## 2018-01-02 NOTE — Progress Notes (Signed)
Subjective:    Patient ID: Kristen [name], female    DOB: 13-Dec-1970, 47 y.o.   MRN: [id number]  HPI  Ms. Kristen [name] is a 47 year old female who presents today with a chief complaint of flank pain. Her pain is located to the left flank which began 3-4 days ago.   She also reports vaginal discharge, vaginal itching, urinary frequency, cloudy urine. She has a history of recurrent yeast infections, three since her hysterectomy in October 2018. She denies radiation of pain, dysuria, hematuria.  She was told by her gynecologist to follow up if she developed another vaginal yeast infection as the treatment would be different. She thinks she may have pulled a muscle to her lower back.  Review of Systems  Constitutional: Negative for fever.  Gastrointestinal: Negative for abdominal pain.  Genitourinary: Positive for flank pain and vaginal discharge. Negative for dysuria, frequency, pelvic pain and vaginal bleeding.       Past Medical History:  Diagnosis Date  . Abnormal Pap smear of cervix    dysplasia   . Anemia    history of  . Anxiety   . Asthma    seasonal asthma, has not required use of inhaler in over 2 years  . Cervical dysplasia 1993  . Depression   . Diverticulitis   . Dysmenorrhea   . Essential hypertension   . GERD (gastroesophageal reflux disease)   . Headache    history of migraines  . IBS (irritable bowel syndrome)   . STD (sexually transmitted disease) ~1998   chlamydia      Social History   Socioeconomic History  . Marital status: Legally Separated    Spouse name: Not on file  . Number of children: 3  . Years of education: 37  . Highest education level: Not on file  Occupational History    Comment: [employer], [employer] Coach  Social Needs  . Financial resource strain: Not on file  . Food insecurity:    Worry: Not on file    Inability: Not on file  . Transportation needs:    Medical: Not on file    Non-medical: Not on file  Tobacco Use  .  Smoking status: Never Smoker  . Smokeless tobacco: Never Used  Substance and Sexual Activity  . Alcohol use: No    Alcohol/week: 0.0 oz  . Drug use: No  . Sexual activity: Not Currently    Partners: Male    Birth control/protection: Surgical  Lifestyle  . Physical activity:    Days per week: Not on file    Minutes per session: Not on file  . Stress: Not on file  Relationships  . Social connections:    Talks on phone: Not on file    Gets together: Not on file    Attends religious service: Not on file    Active member of club or organization: Not on file    Attends meetings of clubs or organizations: Not on file    Relationship status: Not on file  . Intimate partner violence:    Fear of current or ex partner: Not on file    Emotionally abused: Not on file    Physically abused: Not on file    Forced sexual activity: Not on file  Other Topics Concern  . Not on file  Social History Narrative   Separated.    3 children.    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   Enjoys relaxing, spending time with her  daughter.     Past Surgical History:  Procedure Laterality Date  . ADENOIDECTOMY     lymph nodes removed  . APPENDECTOMY  1990  . COLONOSCOPY    . COLPOSCOPY  1992  . CYSTOSCOPY N/A 02/12/2017   Procedure: CYSTOSCOPY;  Surgeon: Jerene Bears, MD;  Location: [facility];  Service: Gynecology;  Laterality: N/A;  . TONSILLECTOMY    . TOTAL LAPAROSCOPIC HYSTERECTOMY WITH SALPINGECTOMY Bilateral 02/12/2017   Procedure: HYSTERECTOMY TOTAL LAPAROSCOPIC WITH SALPINGECTOMY;  Surgeon: Jerene Bears, MD;  Location: [facility];  Service: Gynecology;  Laterality: Bilateral;  . TUBAL LIGATION      Family History  Problem Relation Age of Onset  . Hypertension Mother   . Depression Mother   . Diabetes Maternal Grandmother   . Hypertension Maternal Grandmother   . Colon cancer Father 28  . Diabetes Brother   . Stomach cancer Neg Hx     Allergies  Allergen Reactions  . Amoxicillin Nausea Only     Has patient had a PCN reaction causing immediate rash, facial/tongue/throat swelling, SOB or lightheadedness with hypotension: No Has patient had a PCN reaction causing severe rash involving mucus membranes or skin necrosis: No Has patient had a PCN reaction that required hospitalization: No Has patient had a PCN reaction occurring within the last 10 years: Yes If all of the above answers are "NO", then may proceed with Cephalosporin use.   Marland Kitchen Doxycycline   . Penicillin G Rash    Has patient had a PCN reaction causing immediate rash, facial/tongue/throat swelling, SOB or lightheadedness with hypotension: Unknown Has patient had a PCN reaction causing severe rash involving mucus membranes or skin necrosis: Unknown Has patient had a PCN reaction that required hospitalization: Unknown Has patient had a PCN reaction occurring within the last 10 years: No If all of the above answers are "NO", then may proceed with Cephalosporin use.     Current Outpatient Medications on File Prior to Visit  Medication Sig Dispense Refill  . estradiol (VIVELLE-DOT) 0.1 MG/24HR patch Place 1 patch (0.1 mg total) onto the skin 2 (two) times a week. 24 patch 3  . FLUoxetine (PROZAC) 10 MG tablet Take 1 tablet (10 mg total) by mouth daily. 90 tablet 4  . hydrochlorothiazide (HYDRODIURIL) 12.5 MG tablet Take 12.5 mg by mouth 2 (two) times daily.    . pantoprazole (PROTONIX) 20 MG tablet Take 1 tablet (20 mg total) by mouth daily. 90 tablet 0  . progesterone (PROMETRIUM) 100 MG capsule Take 1 capsule (100 mg total) by mouth at bedtime. 90 capsule 0  . temazepam (RESTORIL) 15 MG capsule Take 1 capsule (15 mg total) by mouth at bedtime as needed for sleep. 30 capsule 1   No current facility-administered medications on file prior to visit.     BP 136/86 (BP Location: Right Arm, Patient Position: Sitting, Cuff Size: Normal)   Pulse 73   Temp 97.9 F (36.6 C) (Oral)   Resp 16   Ht  (1.575 m)   Wt 215 lb 8 oz  (97.8 kg)   LMP 01/27/2017 (Exact Date)   SpO2 98%   BMI 39.42 kg/m     Objective:   Physical Exam  Constitutional: She appears well-nourished.  Neck: Neck supple.  Cardiovascular: Normal rate.  Respiratory: Effort normal.  GI: Soft. There is no tenderness. There is no CVA tenderness.  Musculoskeletal:       Back:  Mild tenderness upon medium palpation.   Skin: Skin is  warm and dry.           Assessment & Plan:  Flank Pain:  To left flank x 3 days.  Exam today with suspicion for MSK involvement.  UA today negative for leuks, nitrites, blood. Positive protein. Culture sent. No evidence of protein on prior UA's, will repeat UA next week. Renal function from October normal. Suspect vaginal yeast infection based off of symptoms and history. Will treat with Diflucan today as she cannot get in to see her GYN.  Discussed to follow up with GYN for re-evaluation. She will follow up with [place] if her flank pain persists. Heat/Ice, stretching/NSAID's.  Doreene Nest, NP

## 2018-01-03 ENCOUNTER — Encounter: Payer: Self-pay | Admitting: Primary Care

## 2018-01-03 LAB — URINE CULTURE
MICRO NUMBER:: 90652535
SPECIMEN QUALITY:: ADEQUATE

## 2018-03-14 ENCOUNTER — Ambulatory Visit (INDEPENDENT_AMBULATORY_CARE_PROVIDER_SITE_OTHER): Payer: No Typology Code available for payment source | Admitting: Obstetrics & Gynecology

## 2018-03-14 ENCOUNTER — Encounter: Payer: Self-pay | Admitting: Obstetrics & Gynecology

## 2018-03-14 VITALS — BP 116/88 | HR 80 | Resp 16 | Ht 62.0 in | Wt 215.6 lb

## 2018-03-14 DIAGNOSIS — T385X5D Adverse effect of other estrogens and progestogens, subsequent encounter: Secondary | ICD-10-CM

## 2018-03-14 NOTE — Progress Notes (Signed)
GYNECOLOGY  VISIT  Pt needed to leave before exam was completed.

## 2018-03-28 ENCOUNTER — Encounter: Payer: Self-pay | Admitting: Obstetrics & Gynecology

## 2018-03-28 ENCOUNTER — Ambulatory Visit (INDEPENDENT_AMBULATORY_CARE_PROVIDER_SITE_OTHER): Payer: No Typology Code available for payment source | Admitting: Obstetrics & Gynecology

## 2018-03-28 ENCOUNTER — Other Ambulatory Visit: Payer: Self-pay

## 2018-03-28 VITALS — BP 122/86 | HR 96 | Resp 16 | Ht 62.0 in | Wt 217.2 lb

## 2018-03-28 DIAGNOSIS — R350 Frequency of micturition: Secondary | ICD-10-CM

## 2018-03-28 LAB — POCT URINALYSIS DIPSTICK
Bilirubin, UA: NEGATIVE
Blood, UA: NEGATIVE
Glucose, UA: NEGATIVE
Ketones, UA: NEGATIVE
Leukocytes, UA: NEGATIVE
Nitrite, UA: NEGATIVE
Protein, UA: NEGATIVE
Urobilinogen, UA: 0.2 E.U./dL
pH, UA: 7 (ref 5.0–8.0)

## 2018-03-28 MED ORDER — ESTRADIOL 0.5 MG/0.5GM TD GEL: 1.0000 | Freq: Every day | TRANSDERMAL | 1 refills | Status: DC

## 2018-03-28 MED ORDER — TEMAZEPAM 15 MG PO CAPS: 15.0000 mg | ORAL_CAPSULE | Freq: Every evening | ORAL | 1 refills | Status: DC | PRN

## 2018-03-28 MED ORDER — NYSTATIN 100000 UNIT/GM EX POWD
Freq: Two times a day (BID) | CUTANEOUS | 2 refills | Status: DC
Start: 2018-03-28 — End: 2018-10-15

## 2018-03-28 MED ORDER — DULOXETINE HCL 20 MG PO CPEP: 20.0000 mg | ORAL_CAPSULE | Freq: Every day | ORAL | 1 refills | Status: DC

## 2018-03-28 NOTE — Progress Notes (Signed)
GYNECOLOGY  VISIT  CC:   Several questions related to hormonal therapies  HPI: 47 y.o. [id number] Married [place] female here for HRT consult.  States the biggest issue is her mood.  She reports yesterday she felt very emotional, for example.  She felt yesterday like she could cry all day long.  States "if her husband said boo, she would have cried".  Feels her memory is not as good as it used to be.    Used the vivelle dot.  Had patches due to skin reaction.  Still taking the 100mg  progesterone at night.  Not having hot flashes very frequently.  Has maybe one hot flash a week.  However, when she had a hot flash it lasts longer than it did before.  When this happens, she will sit in front of a fan and this helps.    Vaginal lubrication is not as good.  Feels she would like to restart estrogen.  Did try oral dosage after hysterectomy but she did not feel this worked very well.  Options for topical estradiol discussed.    Sleep is pretty good.  Uses restoril 15mg  prn.  Does need RF but not using regularly.    GYNECOLOGIC HISTORY: Patient's last menstrual period was 01/27/2017 (exact date). Contraception: hysterectomy  Menopausal hormone therapy: none  Patient Active Problem List   Diagnosis Date Noted  . Chest pain 09/04/2017  . Hot flashes 05/28/2017  . Obesity (BMI 30-39.9) 09/03/2016  . Intracranial hypertension, benign 06/22/2016  . Visual disturbance 05/18/2016  . Other fatigue 03/01/2016  . Preventative health care 01/03/2016  . Insomnia 11/29/2015  . Essential hypertension 11/11/2015  . GERD (gastroesophageal reflux disease) 11/11/2015    Past Medical History:  Diagnosis Date  . Abnormal Pap smear of cervix    dysplasia   . Anemia    history of  . Anxiety   . Asthma    seasonal asthma, has not required use of inhaler in over 2 years  . Cervical dysplasia 1993  . Depression   . Diverticulitis   . Dysmenorrhea   . Essential hypertension   . GERD  (gastroesophageal reflux disease)   . Headache    history of migraines  . IBS (irritable bowel syndrome)   . STD (sexually transmitted disease) ~1998   chlamydia     Past Surgical History:  Procedure Laterality Date  . ADENOIDECTOMY     lymph nodes removed  . APPENDECTOMY  1990  . COLONOSCOPY    . COLPOSCOPY  1992  . CYSTOSCOPY N/A 02/12/2017   Procedure: CYSTOSCOPY;  Surgeon: Jerene BearsMiller, Jnya Brossard S, MD;  Location: [facility];  Service: Gynecology;  Laterality: N/A;  . TONSILLECTOMY    . TOTAL LAPAROSCOPIC HYSTERECTOMY WITH SALPINGECTOMY Bilateral 02/12/2017   Procedure: HYSTERECTOMY TOTAL LAPAROSCOPIC WITH SALPINGECTOMY;  Surgeon: Jerene BearsMiller, Nikkol Pai S, MD;  Location: [facility];  Service: Gynecology;  Laterality: Bilateral;  . TUBAL LIGATION      MEDS:   Current Outpatient Medications on File Prior to Visit  Medication Sig Dispense Refill  . hydrochlorothiazide (HYDRODIURIL) 25 MG tablet Take 1 tablet by mouth daily as needed.    . pantoprazole (PROTONIX) 40 MG tablet Take 1 tablet by mouth daily.    . progesterone (PROMETRIUM) 100 MG capsule Take 1 capsule (100 mg total) by mouth at bedtime. 90 capsule 0  . temazepam (RESTORIL) 15 MG capsule Take 1 capsule (15 mg total) by mouth at bedtime as needed for sleep. 30 capsule 1   No current  facility-administered medications on file prior to visit.     ALLERGIES: Amoxicillin; Doxycycline; and Penicillin g  Family History  Problem Relation Age of Onset  . Hypertension Mother   . Depression Mother   . Diabetes Maternal Grandmother   . Hypertension Maternal Grandmother   . Colon cancer Father 44  . Diabetes Brother   . Stomach cancer Neg Hx     SH:  Married, non smoker  Review of Systems  Genitourinary:       Pain or bleeding with intercourse Loss of urine with sneeze or cough   Psychiatric/Behavioral: Positive for depression. The patient is nervous/anxious.        Difficulty with memory   All other systems reviewed and are  negative.   PHYSICAL EXAMINATION:    BP 122/86 (BP Location: Right Arm, Patient Position: Sitting, Cuff Size: Normal)   Pulse 96   Resp 16   Ht 5\' 2"  (1.575 m)   Wt 217 lb 3.2 oz (98.5 kg)   LMP 01/27/2017 (Exact Date)   BMI 39.73 kg/m     General appearance: alert, cooperative and appears stated age Abdomen: soft, non-tender; bowel sounds normal; no masses,  no organomegaly Lymph:  no inguinal LAD noted  Pelvic: External genitalia:  no lesions              Urethra:  normal appearing urethra with no masses, tenderness or lesions              Bartholins and Skenes: normal                 Vagina: normal appearing vagina with normal color and discharge, no lesions              Cervix: absent              Bimanual Exam:  Uterus:  uterus absent              Adnexa: no mass, fullness, tenderness             Assessment: Mood changes Possible menopausal symptoms   Plan: Trial of topical estradiol of 0.5mg /1/2gm gel.  Apply topically daily. Will also try Cymbalta 20mg  daily.  Rx to pharmacy. Restoril RF given Nystatin RF given She will give update in 2-4 weeks and/or if has any issues with medications/side effects.   ~15 minutes spent with patient >50% of time was in face to face discussion of above.

## 2018-04-03 ENCOUNTER — Other Ambulatory Visit: Payer: Self-pay

## 2018-04-03 ENCOUNTER — Emergency Department: Payer: No Typology Code available for payment source

## 2018-04-03 ENCOUNTER — Encounter: Payer: Self-pay | Admitting: *Deleted

## 2018-04-03 ENCOUNTER — Emergency Department
Admission: EM | Admit: 2018-04-03 | Discharge: 2018-04-04 | Disposition: A | Discharge status: Home / Self Care | Payer: No Typology Code available for payment source | Attending: Emergency Medicine | Admitting: Emergency Medicine

## 2018-04-03 DIAGNOSIS — J45909 Unspecified asthma, uncomplicated: Secondary | ICD-10-CM | POA: Diagnosis not present

## 2018-04-03 DIAGNOSIS — Z79899 Other long term (current) drug therapy: Secondary | ICD-10-CM | POA: Diagnosis not present

## 2018-04-03 DIAGNOSIS — I10 Essential (primary) hypertension: Secondary | ICD-10-CM | POA: Diagnosis not present

## 2018-04-03 DIAGNOSIS — G43909 Migraine, unspecified, not intractable, without status migrainosus: Secondary | ICD-10-CM | POA: Insufficient documentation

## 2018-04-03 DIAGNOSIS — R51 Headache: Secondary | ICD-10-CM | POA: Diagnosis present

## 2018-04-03 DIAGNOSIS — M542 Cervicalgia: Secondary | ICD-10-CM | POA: Insufficient documentation

## 2018-04-03 DIAGNOSIS — R221 Localized swelling, mass and lump, neck: Secondary | ICD-10-CM | POA: Diagnosis not present

## 2018-04-03 LAB — BASIC METABOLIC PANEL
Anion gap: 4 — ABNORMAL LOW (ref 5–15)
BUN: 11 mg/dL (ref 6–20)
CO2: 27 mmol/L (ref 22–32)
Calcium: 8.8 mg/dL — ABNORMAL LOW (ref 8.9–10.3)
Chloride: 107 mmol/L (ref 98–111)
Creatinine, Ser: 0.78 mg/dL (ref 0.44–1.00)
GFR calc Af Amer: >60 mL/min (ref >60)
GFR calc non Af Amer: >60 mL/min (ref >60)
Glucose, Bld: 96 mg/dL (ref 70–99)
Potassium: 3.7 mmol/L (ref 3.5–5.1)
Sodium: 138 mmol/L (ref 135–145)

## 2018-04-03 LAB — CBC
HCT: 39.4 % (ref 35.0–47.0)
Hemoglobin: 13.6 g/dL (ref 12.0–16.0)
MCH: 33.2 pg (ref 26.0–34.0)
MCHC: 34.4 g/dL (ref 32.0–36.0)
MCV: 96.5 fL (ref 80.0–100.0)
Platelets: 359 10*3/uL (ref 150–440)
RBC: 4.08 MIL/uL (ref 3.80–5.20)
RDW: 12.7 % (ref 11.5–14.5)
WBC: 7.2 10*3/uL (ref 3.6–11.0)

## 2018-04-03 LAB — TROPONIN I: Troponin I: <0.03 ng/mL (ref <0.03)

## 2018-04-03 MED ORDER — METOCLOPRAMIDE HCL 5 MG/ML IJ SOLN
10.0000 mg | INTRAMUSCULAR | Status: AC
  Administered 2018-04-03: 10 mg via INTRAVENOUS
  Filled 2018-04-03: qty 2

## 2018-04-03 MED ORDER — DIPHENHYDRAMINE HCL 50 MG/ML IJ SOLN
12.5000 mg | INTRAMUSCULAR | Status: AC
  Administered 2018-04-03: 12.5 mg via INTRAVENOUS
  Filled 2018-04-03: qty 1

## 2018-04-03 MED ORDER — KETOROLAC TROMETHAMINE 30 MG/ML IJ SOLN
15.0000 mg | Freq: Once | INTRAMUSCULAR | Status: AC
  Administered 2018-04-03: 15 mg via INTRAVENOUS
  Filled 2018-04-03: qty 1

## 2018-04-03 MED ORDER — IOPAMIDOL (ISOVUE-370) INJECTION 76%
75.0000 mL | Freq: Once | INTRAVENOUS | Status: AC | PRN
  Administered 2018-04-03: 75 mL via INTRAVENOUS

## 2018-04-03 MED ORDER — ACETAMINOPHEN 500 MG PO TABS
1000.0000 mg | ORAL_TABLET | Freq: Once | ORAL | Status: AC
  Administered 2018-04-03: 1000 mg via ORAL
  Filled 2018-04-03: qty 2

## 2018-04-03 MED ORDER — SODIUM CHLORIDE 0.9 % IV BOLUS
1000.0000 mL | Freq: Once | INTRAVENOUS | Status: AC
  Administered 2018-04-03: 1000 mL via INTRAVENOUS

## 2018-04-03 MED ORDER — DEXAMETHASONE SODIUM PHOSPHATE 10 MG/ML IJ SOLN
10.0000 mg | Freq: Once | INTRAMUSCULAR | Status: AC
  Administered 2018-04-03: 10 mg via INTRAVENOUS
  Filled 2018-04-03: qty 1

## 2018-04-03 NOTE — ED Notes (Signed)
Forbach MD at bedside. 

## 2018-04-03 NOTE — ED Triage Notes (Signed)
Pt to triage via wheelchair.  Pt has headache, dizziness, chest pain and pain in left side of neck .  No n/v  Pt alert  Speech clear.

## 2018-04-03 NOTE — ED Notes (Signed)
Pt to CT. ABCs intact. NAD 

## 2018-04-04 NOTE — ED Provider Notes (Signed)
[facility] Emergency Department Provider Note  ____________________________________________   First MD Initiated Contact with Patient 04/03/18 2105     (approximate)  I have reviewed the triage vital signs and the nursing notes.   HISTORY  Chief Complaint Migraine and Dizziness    HPI Kristen Duffelarlesia [name] is a 47 y.o. female who reports a history of headaches/migraines and presents for evaluation of a left-sided headache with some blurry vision, photophobia, and nausea but which is also accompanied with pain in the left side of her neck.  She says that this is been present for a little bit more than a day and she is concerned that she may have a carotid dissection.  She had no trauma or obvious injury of which she is aware.  She says that she feels some swelling in the left side of her neck.  She said that she was worked up by a neurologist at other doctors in the past who initially told her that she had idiopathic intracranial hypertension but then after multiple lumbar punctures demonstrated normal opening pressure they decided that was not what was going on.  She no longer goes to her neurologist.  She denies fever/chills, vomiting, abdominal pain, chest pain, shortness of breath, and dysuria.  She describes her pain is severe and nothing in particular makes it better.  Past Medical History:  Diagnosis Date  . Abnormal Pap smear of cervix    dysplasia   . Anemia    history of  . Anxiety   . Asthma    seasonal asthma, has not required use of inhaler in over 2 years  . Cervical dysplasia 1993  . Depression   . Diverticulitis   . Dysmenorrhea   . Essential hypertension   . GERD (gastroesophageal reflux disease)   . Headache    history of migraines  . IBS (irritable bowel syndrome)   . STD (sexually transmitted disease) ~1998   chlamydia     Patient Active Problem List   Diagnosis Date Noted  . Chest pain 09/04/2017  . Hot flashes 05/28/2017  .  Obesity (BMI 30-39.9) 09/03/2016  . Intracranial hypertension, benign 06/22/2016  . Visual disturbance 05/18/2016  . Other fatigue 03/01/2016  . Preventative health care 01/03/2016  . Insomnia 11/29/2015  . Essential hypertension 11/11/2015  . GERD (gastroesophageal reflux disease) 11/11/2015    Past Surgical History:  Procedure Laterality Date  . ADENOIDECTOMY     lymph nodes removed  . APPENDECTOMY  1990  . COLONOSCOPY    . COLPOSCOPY  1992  . CYSTOSCOPY N/A 02/12/2017   Procedure: CYSTOSCOPY;  Surgeon: Jerene BearsMiller, Mary S, MD;  Location: [facility];  Service: Gynecology;  Laterality: N/A;  . TONSILLECTOMY    . TOTAL LAPAROSCOPIC HYSTERECTOMY WITH SALPINGECTOMY Bilateral 02/12/2017   Procedure: HYSTERECTOMY TOTAL LAPAROSCOPIC WITH SALPINGECTOMY;  Surgeon: Jerene BearsMiller, Mary S, MD;  Location: [facility];  Service: Gynecology;  Laterality: Bilateral;  . TUBAL LIGATION      Prior to Admission medications   Medication Sig Start Date End Date Taking? Authorizing Provider  DULoxetine (CYMBALTA) 20 MG capsule Take 1 capsule (20 mg total) by mouth daily. 03/28/18   Jerene BearsMiller, Mary S, MD  Estradiol 0.5 MG/0.5GM GEL Apply 1 packet topically daily. 03/28/18   Jerene BearsMiller, Mary S, MD  hydrochlorothiazide (HYDRODIURIL) 25 MG tablet Take 1 tablet by mouth daily as needed. 11/11/15   [provider]  nystatin (MYCOSTATIN/NYSTOP) powder Apply topically 2 (two) times daily. Apply daily to affected skin 03/28/18  Jerene Bears, MD  pantoprazole (PROTONIX) 40 MG tablet Take 1 tablet by mouth daily. 05/16/16   [provider]  progesterone (PROMETRIUM) 100 MG capsule Take 1 capsule (100 mg total) by mouth at bedtime. 09/24/17   Jerene Bears, MD  temazepam (RESTORIL) 15 MG capsule Take 1 capsule (15 mg total) by mouth at bedtime as needed for sleep. 03/28/18   Jerene Bears, MD    Allergies Amoxicillin; Doxycycline; and Penicillin g  Family History  Problem Relation Age of Onset  . Hypertension Mother   .  Depression Mother   . Diabetes Maternal Grandmother   . Hypertension Maternal Grandmother   . Colon cancer Father 42  . Diabetes Brother   . Stomach cancer Neg Hx     Social History Social History   Tobacco Use  . Smoking status: Never Smoker  . Smokeless tobacco: Never Used  Substance Use Topics  . Alcohol use: No    Alcohol/week: 0.0 standard drinks  . Drug use: No    Review of Systems Constitutional: No fever/chills Eyes: Blurry vision and photophobia ENT: No sore throat.  Tenderness and pain in the left side of the neck Cardiovascular: Denies chest pain. Respiratory: Denies shortness of breath. Gastrointestinal: No abdominal pain.  Nausea, no vomiting.  No diarrhea.  No constipation. Genitourinary: Negative for dysuria. Musculoskeletal: Negative for neck pain.  Negative for back pain. Integumentary: Negative for rash. Neurological: Left-sided sharp and aching headache, no focal neurological deficits   ____________________________________________   PHYSICAL EXAM:  VITAL SIGNS: ED Triage Vitals  Enc Vitals Group     BP 04/03/18 1848 (!) 158/88     Pulse Rate 04/03/18 1845 68     Resp 04/03/18 1845 18     Temp 04/03/18 1845 98.4 F (36.9 C)     Temp Source 04/03/18 1845 Oral     SpO2 04/03/18 1845 97 %     Weight 04/03/18 1846 98.4 kg (217 lb)     Height 04/03/18 1846 1.575 m (5\' 2" )     Head Circumference --      Peak Flow --      Pain Score 04/03/18 1846 5     Pain Loc --      Pain Edu? --      Excl. in GC? --     Constitutional: Alert and oriented.  Generally well-appearing but does appear uncomfortable Eyes: Conjunctivae are normal.  Pupils are equal and reactive with normal extraocular motion Head: Atraumatic. Nose: No congestion/rhinnorhea. Mouth/Throat: Mucous membranes are moist. Neck: No stridor.  No meningeal signs.  No carotid bruit upon auscultation of the neck Cardiovascular: Normal rate, regular rhythm. Good peripheral circulation.  Grossly normal heart sounds. Respiratory: Normal respiratory effort.  No retractions. Lungs CTAB. Gastrointestinal: Soft and nontender. No distention.  Musculoskeletal: No lower extremity tenderness nor edema. No gross deformities of extremities.  No appreciable swelling in the left side of the neck but the patient does report tenderness to palpation Neurologic:  Normal speech and language. No gross focal neurologic deficits are appreciated.  Skin:  Skin is warm, dry and intact. No rash noted. Psychiatric: Mood and affect are normal. Speech and behavior are normal.  ____________________________________________   LABS (all labs ordered are listed, but only abnormal results are displayed)  Labs Reviewed  BASIC METABOLIC PANEL - Abnormal; Notable for the following components:      Result Value   Calcium 8.8 (*)    Anion gap 4 (*)  All other components within normal limits  CBC  TROPONIN I   ____________________________________________  EKG  ED ECG REPORT I, Loleta Rose, the attending physician, personally viewed and interpreted this ECG.  Date: 04/04/2018 EKG Time: 18:45 Rate: 74 Rhythm: normal sinus rhythm QRS Axis: normal Intervals: normal ST/T Wave abnormalities: Non-specific ST segment / T-wave changes, but no evidence of acute ischemia. Narrative Interpretation: no evidence of acute ischemia   ____________________________________________  RADIOLOGY I, Loleta Rose, personally viewed and evaluated these images (plain radiographs) as part of my medical decision making, as well as reviewing the written report by the radiologist.  ED MD interpretation: No acute abnormalities on chest x-ray.  No evidence of dissection on CT angios neck  Official radiology report(s): Dg Chest 2 View  Result Date: 04/03/2018 CLINICAL DATA:  Left chest pain EXAM: CHEST - 2 VIEW COMPARISON:  06/03/2015 FINDINGS: Peribronchial thickening. Heart and mediastinal contours are within normal  limits. No focal opacities or effusions. No acute bony abnormality. IMPRESSION: Bronchitic changes. Electronically Signed   By: Charlett Nose M.D.   On: 04/03/2018 19:20   Ct Angio Neck W And/or Wo Contrast  Result Date: 04/03/2018 CLINICAL DATA:  47 y/o F; headache, dizziness, chest pain, as well as pain in the left side of the neck. EXAM: CT ANGIOGRAPHY NECK TECHNIQUE: Multidetector CT imaging of the neck was performed using the standard protocol during bolus administration of intravenous contrast. Multiplanar CT image reconstructions and MIPs were obtained to evaluate the vascular anatomy. Carotid stenosis measurements (when applicable) are obtained utilizing NASCET criteria, using the distal internal carotid diameter as the denominator. CONTRAST:  75mL ISOVUE-370 IOPAMIDOL (ISOVUE-370) INJECTION 76% COMPARISON:  None. FINDINGS: Aortic arch: Standard branching. Imaged portion shows no evidence of aneurysm or dissection. No significant stenosis of the major arch vessel origins. Right carotid system: No evidence of dissection, stenosis (50% or greater) or occlusion. Left carotid system: No evidence of dissection, stenosis (50% or greater) or occlusion. Vertebral arteries: Codominant. No evidence of dissection, stenosis (50% or greater) or occlusion. Skeleton: Negative. Other neck: Negative. Upper chest: Negative. IMPRESSION: Negative CTA of the neck. Electronically Signed   By: Mitzi Hansen M.D.   On: 04/03/2018 23:03    ____________________________________________   PROCEDURES  Critical Care performed: No   Procedure(s) performed:   Procedures   ____________________________________________   INITIAL IMPRESSION / ASSESSMENT AND PLAN / ED COURSE  As part of my medical decision making, I reviewed the following data within the electronic MEDICAL RECORD NUMBER Nursing notes reviewed and incorporated, Labs reviewed , EKG interpreted , Radiograph reviewed  and Notes from prior ED  visits    Differential diagnosis includes, but is not limited to, migraine, cluster headache, idiopathic intracranial hypertension, neoplasm.  Carotid dissection is also possible but I think less likely, however the patient is very concerned about this possibility of mentioned multiple times that she wants to be evaluated for it.  I explained to her that I thought that the test to be negative but I would obtain a CTA neck to at least provide the reassurance she has no sign of carotid dissection.  I also recommended that we treat her empirically for migraine because I think that is most likely what is going on.  She told me that she no longer goes to her neurologist and I think that she is having some refractory migraines.  I treated her with Toradol 50 mg IV, normal saline IV bolus, metoclopramide 10 mg IV, Benadryl 12.5 mg IV, Tylenol  1000 mg p.o., and Decadron 10 mg IV.  I checked on her after the CTA neck came back normal and the patient was sleeping comfortably.  She awoke easily and said that she felt much better and that her headache was gone.  I updated her about the results and encourage close outpatient follow-up.  She states that she understands and agrees with the plan.     ____________________________________________  FINAL CLINICAL IMPRESSION(S) / ED DIAGNOSES  Final diagnoses:  Migraine without status migrainosus, not intractable, unspecified migraine type  Neck pain on left side     MEDICATIONS GIVEN DURING THIS VISIT:  Medications  sodium chloride 0.9 % bolus 1,000 mL (0 mLs Intravenous Stopped 04/03/18 2345)  metoCLOPramide (REGLAN) injection 10 mg (10 mg Intravenous Given 04/03/18 2221)  ketorolac (TORADOL) 30 MG/ML injection 15 mg (15 mg Intravenous Given 04/03/18 2220)  dexamethasone (DECADRON) injection 10 mg (10 mg Intravenous Given 04/03/18 2220)  acetaminophen (TYLENOL) tablet 1,000 mg (1,000 mg Oral Given 04/03/18 2220)  diphenhydrAMINE (BENADRYL) injection 12.5 mg (12.5  mg Intravenous Given 04/03/18 2221)  iopamidol (ISOVUE-370) 76 % injection 75 mL (75 mLs Intravenous Contrast Given 04/03/18 2234)     ED Discharge Orders    None       Note:  This document was prepared using Dragon voice recognition software and may include unintentional dictation errors.    Loleta Rose, MD 04/04/18 [phone]

## 2018-04-04 NOTE — Discharge Instructions (Signed)

## 2018-04-25 ENCOUNTER — Encounter: Payer: Self-pay | Admitting: Primary Care

## 2018-04-25 ENCOUNTER — Ambulatory Visit (INDEPENDENT_AMBULATORY_CARE_PROVIDER_SITE_OTHER): Payer: No Typology Code available for payment source | Admitting: Primary Care

## 2018-04-25 VITALS — BP 146/94 | HR 75 | Temp 98.2°F | Ht 62.0 in | Wt 219.8 lb

## 2018-04-25 DIAGNOSIS — Z23 Encounter for immunization: Secondary | ICD-10-CM

## 2018-04-25 DIAGNOSIS — F411 Generalized anxiety disorder: Secondary | ICD-10-CM | POA: Diagnosis not present

## 2018-04-25 DIAGNOSIS — I10 Essential (primary) hypertension: Secondary | ICD-10-CM | POA: Diagnosis not present

## 2018-04-25 MED ORDER — AMLODIPINE BESYLATE 10 MG PO TABS: ORAL_TABLET | ORAL | 0 refills | Status: DC

## 2018-04-25 MED ORDER — BUSPIRONE HCL 7.5 MG PO TABS: ORAL_TABLET | ORAL | 1 refills | Status: DC

## 2018-04-25 NOTE — Assessment & Plan Note (Signed)
Chronic for years. Failed numerous treatment regimens including SSRI, SNRI, Wellbutrin. Will trial low dose Buspar BID. Will see her back in a few weeks for BP check and will follow up on anxiety. Denies SI/HI.

## 2018-04-25 NOTE — Progress Notes (Signed)
Subjective:    Patient ID: Kristen [name], female    DOB: 1970-12-15, 47 y.o.   MRN: [id number]  HPI  Ms. Kristen [name] is a 47 year old female who presents today to discuss hypertension.  1) Essential Hypertension: She is currently prescribed HCTZ 25 mg for which she stopped back in May 2019 due to urinary frequency. She also didn't think it was helping with her diastolic pressure. She is not taking anything for hypertension now.  BP Readings from Last 3 Encounters:  04/25/18 (!) 146/94  04/04/18 (!) 146/96  03/28/18 122/86   2) GAD: Long history of for years. Symptoms include feeling anxious, a feeling of dread, daily worry, irritability. She has failed treatment on Prozac, Cymbalta, Lexapro, Zoloft, Wellbutrin, Effexor for anxiety. History of PMD and is on progesterone 100 mg daily. She denies feeling depressed. GAD 7 score of 17.   Review of Systems  Eyes: Negative for visual disturbance.  Respiratory: Negative for shortness of breath.   Cardiovascular: Negative for chest pain.  Neurological: Negative for dizziness and headaches.  Psychiatric/Behavioral: The patient is nervous/anxious.        See HPI       Past Medical History:  Diagnosis Date  . Abnormal Pap smear of cervix    dysplasia   . Anemia    history of  . Anxiety   . Asthma    seasonal asthma, has not required use of inhaler in over 2 years  . Cervical dysplasia 1993  . Depression   . Diverticulitis   . Dysmenorrhea   . Essential hypertension   . GERD (gastroesophageal reflux disease)   . Headache    history of migraines  . IBS (irritable bowel syndrome)   . STD (sexually transmitted disease) ~1998   chlamydia      Social History   Socioeconomic History  . Marital status: Married    Spouse name: Not on file  . Number of children: 3  . Years of education: 3618  . Highest education level: Not on file  Occupational History    Comment: [employer]  Social Needs  . Financial  resource strain: Not on file  . Food insecurity:    Worry: Not on file    Inability: Not on file  . Transportation needs:    Medical: Not on file    Non-medical: Not on file  Tobacco Use  . Smoking status: Never Smoker  . Smokeless tobacco: Never Used  Substance and Sexual Activity  . Alcohol use: No    Alcohol/week: 0.0 standard drinks  . Drug use: No  . Sexual activity: Not Currently    Partners: Male    Birth control/protection: Surgical  Lifestyle  . Physical activity:    Days per week: Not on file    Minutes per session: Not on file  . Stress: Not on file  Relationships  . Social connections:    Talks on phone: Not on file    Gets together: Not on file    Attends religious service: Not on file    Active member of club or organization: Not on file    Attends meetings of clubs or organizations: Not on file    Relationship status: Not on file  . Intimate partner violence:    Fear of current or ex partner: Not on file    Emotionally abused: Not on file    Physically abused: Not on file    Forced sexual activity: Not on file  Other Topics Concern  . Not on file  Social History Narrative   Separated.    3 children.    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     Past Surgical History:  Procedure Laterality Date  . ADENOIDECTOMY     lymph nodes removed  . APPENDECTOMY  1990  . COLONOSCOPY    . COLPOSCOPY  1992  . CYSTOSCOPY N/A 02/12/2017   Procedure: CYSTOSCOPY;  Surgeon: Jerene Bears, MD;  Location: [facility];  Service: Gynecology;  Laterality: N/A;  . TONSILLECTOMY    . TOTAL LAPAROSCOPIC HYSTERECTOMY WITH SALPINGECTOMY Bilateral 02/12/2017   Procedure: HYSTERECTOMY TOTAL LAPAROSCOPIC WITH SALPINGECTOMY;  Surgeon: Jerene Bears, MD;  Location: [facility];  Service: Gynecology;  Laterality: Bilateral;  . TUBAL LIGATION      Family History  Problem Relation Age of Onset  . Hypertension Mother   . Depression Mother   .  Diabetes Maternal Grandmother   . Hypertension Maternal Grandmother   . Colon cancer Father 74  . Diabetes Brother   . Stomach cancer Neg Hx     Allergies  Allergen Reactions  . Amoxicillin Nausea Only    Has patient had a PCN reaction causing immediate rash, facial/tongue/throat swelling, SOB or lightheadedness with hypotension: No Has patient had a PCN reaction causing severe rash involving mucus membranes or skin necrosis: No Has patient had a PCN reaction that required hospitalization: No Has patient had a PCN reaction occurring within the last 10 years: Yes If all of the above answers are "NO", then may proceed with Cephalosporin use.   Marland Kitchen Doxycycline   . Penicillin G Rash    Has patient had a PCN reaction causing immediate rash, facial/tongue/throat swelling, SOB or lightheadedness with hypotension: Unknown Has patient had a PCN reaction causing severe rash involving mucus membranes or skin necrosis: Unknown Has patient had a PCN reaction that required hospitalization: Unknown Has patient had a PCN reaction occurring within the last 10 years: No If all of the above answers are "NO", then may proceed with Cephalosporin use.     Current Outpatient Medications on File Prior to Visit  Medication Sig Dispense Refill  . Estradiol 0.5 MG/0.5GM GEL Apply 1 packet topically daily. 30 each 1  . nystatin (MYCOSTATIN/NYSTOP) powder Apply topically 2 (two) times daily. Apply daily to affected skin 45 g 2  . pantoprazole (PROTONIX) 40 MG tablet Take 1 tablet by mouth daily.    . progesterone (PROMETRIUM) 100 MG capsule Take 1 capsule (100 mg total) by mouth at bedtime. 90 capsule 0  . temazepam (RESTORIL) 15 MG capsule Take 1 capsule (15 mg total) by mouth at bedtime as needed for sleep. 30 capsule 1   No current facility-administered medications on file prior to visit.     BP (!) 146/94   Pulse 75   Temp 98.2 F (36.8 C) (Oral)   Ht 5\' 2"  (1.575 m)   Wt 219 lb 12 oz (99.7 kg)   LMP  01/27/2017 (Exact Date)   SpO2 98%   BMI 40.19 kg/m    Objective:   Physical Exam  Constitutional: She appears well-nourished.  Neck: Neck supple.  Cardiovascular: Normal rate and regular rhythm.  Respiratory: Effort normal and breath sounds normal.  Skin: Skin is warm and dry.  Psychiatric: She has a normal mood and affect.           Assessment & Plan:

## 2018-04-25 NOTE — Addendum Note (Signed)
Addended by: Annamarie MajorFUQUAY, Shamecka Hocutt S on: 04/25/2018 02:57 PM   Modules accepted: Orders

## 2018-04-25 NOTE — Assessment & Plan Note (Signed)
Stopped taking HCTZ months ago. BP above goal today, also on prior visits. Rx for Amlodipine 10mg  sent to pharmacy.  Will have her monitor BP at home. Follow up in 2 weeks for BP check.

## 2018-04-25 NOTE — Patient Instructions (Signed)
Start buspirone 7.5 mg tablets for anxiety. Take 1 tablet by mouth twice daily for anxiety.    Start amlodipine 10 mg tablets for high blood pressure. Take 1/2 tablet daily for 3 days, then increase to 1 full tablet thereafter.  Schedule a follow up visit in 3 weeks for blood pressure check.  It was a pleasure to see you today!

## 2018-05-07 ENCOUNTER — Encounter: Payer: Self-pay | Admitting: Primary Care

## 2018-05-07 ENCOUNTER — Ambulatory Visit (INDEPENDENT_AMBULATORY_CARE_PROVIDER_SITE_OTHER): Payer: No Typology Code available for payment source | Admitting: Primary Care

## 2018-05-07 VITALS — BP 126/94 | HR 68 | Temp 98.0°F | Ht 62.0 in | Wt 219.5 lb

## 2018-05-07 DIAGNOSIS — I10 Essential (primary) hypertension: Secondary | ICD-10-CM | POA: Diagnosis not present

## 2018-05-07 MED ORDER — AMLODIPINE BESY-BENAZEPRIL HCL 10-20 MG PO CAPS: 1.0000 | ORAL_CAPSULE | Freq: Every day | ORAL | 0 refills | Status: DC

## 2018-05-07 NOTE — Assessment & Plan Note (Signed)
Improved but not at goal. Change Rx to amlodipine-benazepril 10-20 mg daily. Will have her follow back up in the office in 3 weeks for BP check and BMP.

## 2018-05-07 NOTE — Progress Notes (Signed)
Subjective:    Patient ID: Kristen [name], female    DOB: [birth year], 47 y.o.   MRN: [id number]  HPI  Kristen [name] is a 47 year old female who presents today for follow up of hypertension.   She was last evaluated two weeks ago with reports of urinary frequency on HCTZ 25 mg. She stopped her HCTZ on her own. Given elevated BP readings she was initiated on Amlodipine 10 mg.   Since her last visit she's noticed dry mouth and dry eyes. She denies ankle edema, dizziness. She's checking her BP at home and is getting readings of 130/100's.   BP Readings from Last 3 Encounters:  05/07/18 (!) 126/94  04/25/18 (!) 146/94  04/04/18 (!) 146/96     Review of Systems  Eyes: Negative for visual disturbance.  Respiratory: Negative for shortness of breath.   Cardiovascular: Negative for chest pain and leg swelling.  Neurological: Negative for dizziness.       Past Medical History:  Diagnosis Date  . Abnormal Pap smear of cervix    dysplasia   . Anemia    history of  . Anxiety   . Asthma    seasonal asthma, has not required use of inhaler in over 2 years  . Cervical dysplasia 1993  . Depression   . Diverticulitis   . Dysmenorrhea   . Essential hypertension   . GERD (gastroesophageal reflux disease)   . Headache    history of migraines  . IBS (irritable bowel syndrome)   . STD (sexually transmitted disease) ~1998   chlamydia      Social History   Socioeconomic History  . Marital status: Married    Spouse name: Not on file  . Number of children: 3  . Years of education: 30  . Highest education level: Not on file  Occupational History    Comment: [employer], [employer] Coach  Social Needs  . Financial resource strain: Not on file  . Food insecurity:    Worry: Not on file    Inability: Not on file  . Transportation needs:    Medical: Not on file    Non-medical: Not on file  Tobacco Use  . Smoking status: Never Smoker  . Smokeless tobacco: Never Used  Substance  and Sexual Activity  . Alcohol use: No    Alcohol/week: 0.0 standard drinks  . Drug use: No  . Sexual activity: Not Currently    Partners: Male    Birth control/protection: Surgical  Lifestyle  . Physical activity:    Days per week: Not on file    Minutes per session: Not on file  . Stress: Not on file  Relationships  . Social connections:    Talks on phone: Not on file    Gets together: Not on file    Attends religious service: Not on file    Active member of club or organization: Not on file    Attends meetings of clubs or organizations: Not on file    Relationship status: Not on file  . Intimate partner violence:    Fear of current or ex partner: Not on file    Emotionally abused: Not on file    Physically abused: Not on file    Forced sexual activity: Not on file  Other Topics Concern  . Not on file  Social History Narrative   Separated.    3 children.    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   Enjoys relaxing, spending  time with her daughter.     Past Surgical History:  Procedure Laterality Date  . ADENOIDECTOMY     lymph nodes removed  . APPENDECTOMY  1990  . COLONOSCOPY    . COLPOSCOPY  1992  . CYSTOSCOPY N/A 02/12/2017   Procedure: CYSTOSCOPY;  Surgeon: Jerene Bears, MD;  Location: [facility];  Service: Gynecology;  Laterality: N/A;  . TONSILLECTOMY    . TOTAL LAPAROSCOPIC HYSTERECTOMY WITH SALPINGECTOMY Bilateral 02/12/2017   Procedure: HYSTERECTOMY TOTAL LAPAROSCOPIC WITH SALPINGECTOMY;  Surgeon: Jerene Bears, MD;  Location: [facility];  Service: Gynecology;  Laterality: Bilateral;  . TUBAL LIGATION      Family History  Problem Relation Age of Onset  . Hypertension Mother   . Depression Mother   . Diabetes Maternal Grandmother   . Hypertension Maternal Grandmother   . Colon cancer Father 63  . Diabetes Brother   . Stomach cancer Neg Hx     Allergies  Allergen Reactions  . Amoxicillin Nausea Only    Has patient had a PCN reaction causing immediate rash,  facial/tongue/throat swelling, SOB or lightheadedness with hypotension: No Has patient had a PCN reaction causing severe rash involving mucus membranes or skin necrosis: No Has patient had a PCN reaction that required hospitalization: No Has patient had a PCN reaction occurring within the last 10 years: Yes If all of the above answers are "NO", then may proceed with Cephalosporin use.   Marland Kitchen Doxycycline   . Penicillin G Rash    Has patient had a PCN reaction causing immediate rash, facial/tongue/throat swelling, SOB or lightheadedness with hypotension: Unknown Has patient had a PCN reaction causing severe rash involving mucus membranes or skin necrosis: Unknown Has patient had a PCN reaction that required hospitalization: Unknown Has patient had a PCN reaction occurring within the last 10 years: No If all of the above answers are "NO", then may proceed with Cephalosporin use.     Current Outpatient Medications on File Prior to Visit  Medication Sig Dispense Refill  . busPIRone (BUSPAR) 7.5 MG tablet Take 1 tablet by mouth twice daily for anxiety. 60 tablet 1  . Estradiol 0.5 MG/0.5GM GEL Apply 1 packet topically daily. 30 each 1  . nystatin (MYCOSTATIN/NYSTOP) powder Apply topically 2 (two) times daily. Apply daily to affected skin 45 g 2  . pantoprazole (PROTONIX) 40 MG tablet Take 1 tablet by mouth daily.    . progesterone (PROMETRIUM) 100 MG capsule Take 1 capsule (100 mg total) by mouth at bedtime. 90 capsule 0  . temazepam (RESTORIL) 15 MG capsule Take 1 capsule (15 mg total) by mouth at bedtime as needed for sleep. 30 capsule 1   No current facility-administered medications on file prior to visit.     BP (!) 126/94   Pulse 68   Temp 98 F (36.7 C) (Oral)   Ht 5\' 2"  (1.575 m)   Wt 219 lb 8 oz (99.6 kg)   LMP 01/27/2017 (Exact Date)   SpO2 98%   BMI 40.15 kg/m    Objective:   Physical Exam  Constitutional: She appears well-nourished.  Neck: Neck supple.  Cardiovascular:  Normal rate and regular rhythm.  No ankle edema  Respiratory: Effort normal and breath sounds normal.  Skin: Skin is warm and dry.           Assessment & Plan:

## 2018-05-07 NOTE — Patient Instructions (Signed)
Start amlodipine-benazepril 10-20 mg tablets for high blood pressure. Take 1 tablet by mouth once daily. Do not take amlodipine 10 mg.  Monitor your blood pressure and schedule a follow up visit in 3 weeks for blood pressure and anxiety check.  It was a pleasure to see you today!

## 2018-05-21 ENCOUNTER — Ambulatory Visit (INDEPENDENT_AMBULATORY_CARE_PROVIDER_SITE_OTHER): Payer: Self-pay | Admitting: Physician Assistant

## 2018-05-21 ENCOUNTER — Ambulatory Visit: Payer: Self-pay

## 2018-05-21 ENCOUNTER — Other Ambulatory Visit: Payer: Self-pay | Admitting: Primary Care

## 2018-05-21 VITALS — BP 130/90 | HR 102 | Temp 98.6°F | Resp 18 | Wt 214.0 lb

## 2018-05-21 DIAGNOSIS — J029 Acute pharyngitis, unspecified: Secondary | ICD-10-CM

## 2018-05-21 DIAGNOSIS — I10 Essential (primary) hypertension: Secondary | ICD-10-CM

## 2018-05-21 DIAGNOSIS — Z0000 Encounter for general adult medical examination without abnormal findings: Secondary | ICD-10-CM

## 2018-05-21 LAB — POCT RAPID STREP A (OFFICE): Rapid Strep A Screen: NEGATIVE

## 2018-05-21 MED ORDER — BENZOCAINE 15 MG MT LOZG: 15.0000 mg | LOZENGE | OROMUCOSAL | 0 refills | Status: AC | PRN

## 2018-05-21 MED ORDER — FLUTICASONE PROPIONATE 50 MCG/ACT NA SUSP: 2.0000 | Freq: Every day | NASAL | 0 refills | Status: DC

## 2018-05-21 NOTE — Progress Notes (Signed)
Patient ID: Kristen [name] DOB: 1970-11-29 AGE: 47 y.o. MRN: [id number]   PCP: Doreene Nest, NP   Chief Complaint:  Chief Complaint  Patient presents with  . sore throat     Subjective:    HPI:  Kristen [name] is a 47 y.o. female presents for evaluation  Chief Complaint  Patient presents with  . sore throat    47 year old female presents to [facility] with two day history of sore throat. Began yesterday evening. Severe. Aggravated with swallowing. Left side worse than right. Associated nasal congestion, bilateral maxillary sinus pressure, and dry cough (last evening, has not coughed today). Associated headache, chills, fatigue, and malaise. Has not used any OTC medication for symptom relief. Denies fever via thermometer, dizziness/lightheadedness, ear pain, chest pain, chest congestion/pressure/tightness, wheezing, SOB, abdominal pain, nausea/vomiting, rash. No known close contacts with strep throat. No history of mononucleosis. Patient works in Teacher, music; multiple sick exposures, URI symptoms.  A complete, at least 10 system review of symptoms was performed, pertinent positives and negatives as mentioned in HPI, otherwise negative.  The following portions of the patient's history were reviewed and updated as appropriate: allergies, current medications and past medical history.  Patient Active Problem List   Diagnosis Date Noted  . GAD (generalized anxiety disorder) 04/25/2018  . Chest pain 09/04/2017  . Hot flashes 05/28/2017  . Obesity (BMI 30-39.9) 09/03/2016  . Intracranial hypertension, benign 06/22/2016  . Visual disturbance 05/18/2016  . Other fatigue 03/01/2016  . Preventative health care 01/03/2016  . Insomnia 11/29/2015  . Essential hypertension 11/11/2015  . GERD (gastroesophageal reflux disease) 11/11/2015    Allergies  Allergen Reactions  . Amoxicillin Nausea Only    Has patient had a PCN reaction causing immediate rash,  facial/tongue/throat swelling, SOB or lightheadedness with hypotension: No Has patient had a PCN reaction causing severe rash involving mucus membranes or skin necrosis: No Has patient had a PCN reaction that required hospitalization: No Has patient had a PCN reaction occurring within the last 10 years: Yes If all of the above answers are "NO", then may proceed with Cephalosporin use.   Marland Kitchen Doxycycline   . Penicillin G Rash    Has patient had a PCN reaction causing immediate rash, facial/tongue/throat swelling, SOB or lightheadedness with hypotension: Unknown Has patient had a PCN reaction causing severe rash involving mucus membranes or skin necrosis: Unknown Has patient had a PCN reaction that required hospitalization: Unknown Has patient had a PCN reaction occurring within the last 10 years: No If all of the above answers are "NO", then may proceed with Cephalosporin use.     Current Outpatient Medications on File Prior to Visit  Medication Sig Dispense Refill  . amLODipine-benazepril (LOTREL) 10-20 MG capsule Take 1 capsule by mouth daily. 30 capsule 0  . busPIRone (BUSPAR) 7.5 MG tablet Take 1 tablet by mouth twice daily for anxiety. 60 tablet 1  . Estradiol 0.5 MG/0.5GM GEL Apply 1 packet topically daily. 30 each 1  . nystatin (MYCOSTATIN/NYSTOP) powder Apply topically 2 (two) times daily. Apply daily to affected skin 45 g 2  . pantoprazole (PROTONIX) 40 MG tablet Take 1 tablet by mouth daily.    . progesterone (PROMETRIUM) 100 MG capsule Take 1 capsule (100 mg total) by mouth at bedtime. 90 capsule 0  . temazepam (RESTORIL) 15 MG capsule Take 1 capsule (15 mg total) by mouth at bedtime as needed for sleep. 30 capsule 1   No current facility-administered medications on file prior to visit.  Objective:    Vitals:   05/21/18 0837  BP: 130/90  Pulse: (!) 102  Resp: 18  Temp: 98.6 F (37 C)  SpO2: 97%     Wt Readings from Last 3 Encounters:  05/21/18 214 lb (97.1 kg)   05/07/18 219 lb 8 oz (99.6 kg)  04/25/18 219 lb 12 oz (99.7 kg)    Physical Exam:   General Appearance:  Alert, cooperative, no distress, appears stated age. Afebrile.  Head:  Normocephalic, without obvious abnormality, atraumatic  Eyes:  PERRL, conjunctiva/corneas clear, EOM's intact, fundi benign, both eyes  Ears:  Normal TM's and external ear canals, both ears  Nose: Nares normal, septum midline,mucosa normal, no drainage or sinus tenderness  Throat: Lips, mucosa, and tongue normal; teeth and gums normal. Posterior pharynx reveals mild erythema, left worse than right. No exudate. No visible ulceration. No hot potato/muffled voice. No trismus. No drooling.  Neck: Supple, symmetrical, trachea midline, no adenopathy;  thyroid: not enlarged, symmetric, no tenderness/mass/nodules; no carotid bruit or JVD  Back:   Symmetric, no curvature, ROM normal, no CVA tenderness  Lungs:   Clear to auscultation bilaterally, respirations unlabored  Heart:  Regular rate and rhythm, S1 and S2 normal, no murmur, rub, or gallop  Abdomen:   Soft, non-tender, bowel sounds active all four quadrants,  no masses, no organomegaly  Extremities: Extremities normal, atraumatic, no cyanosis or edema  Pulses: 2+ and symmetric  Skin: Skin color, texture, turgor normal, no rashes or lesions  Lymph nodes: Cervical, supraclavicular, and axillary nodes normal  Neurologic: Normal    Assessment & Plan:   1. Sore throat  - POCT rapid strep A - fluticasone (FLONASE) 50 MCG/ACT nasal spray; Place 2 sprays into both nostrils daily for 7 days.  Dispense: 16 g; Refill: 0 - Benzocaine 15 MG LOZG; Use as directed 1 lozenge (15 mg total) in the mouth or throat every 2 (two) hours as needed for up to 7 days (sore throat).  Dispense: 50 lozenge; Refill: 0  Patient with 2 day history of severe sore throat. Negative rapid strep test. Will treat as viral pharyngitis. Possible contributing factor of PND. Prescribed Benzocaine throat  lozenges and Flonase nasal spray. Advised symptomatic treatment. Patient given work note/excuse for today. Patient instructed to present to urgent care in a few days, if symptoms not improving, for re-evaluation and possible throat culture.   Exam findings, diagnosis etiology and medication use and indications reviewed with patient. Follow-Up and discharge instructions provided. No emergent/urgent issues found on exam.  Patient education was provided.   Patient verbalized understanding of information provided and agrees with plan of care (POC), all questions answered. The patient is advised to call or return to clinic if condition does not see an improvement in symptoms, or to seek the care of the closest emergency department if condition worsens with the above plan.    Rulon Sera, MHS, PA-C Advanced Practice Provider [facility]  [address], [facility], [address] [place], [place] [address] (p):  [phone] [email or web address]

## 2018-05-21 NOTE — Patient Instructions (Signed)
Thank you for choosing [facility] for your health care needs today.  You have been diagnosed with a sore throat.  1. Sore throat  - POCT rapid strep A - fluticasone (FLONASE) 50 MCG/ACT nasal spray; Place 2 sprays into both nostrils daily for 7 days.  Dispense: 16 g; Refill: 0 - Benzocaine 15 MG LOZG; Use as directed 1 lozenge (15 mg total) in the mouth or throat every 2 (two) hours as needed for up to 7 days (sore throat).  Dispense: 50 lozenge; Refill: 0  Your rapid strep test, performed in the office today, was negative.  Recommend you gargle with salt water. May use hot tea or cold water to soothe your throat. Take over the counter Tylenol or ibuprofen for pain/discomfort.  May use prescription throat lozenges for symptom relief. Use prescription nasal spray to prevent post nasal drip.  Follow-up with your family physician, urgent care, or at [facility] in a few days if your symptoms are not improving.  Hope you feel better soon.  Sore Throat  When you have a sore throat, your throat may:  Hurt.  Burn.  Feel irritated.  Feel scratchy.  Many things can cause a sore throat, including:  An infection.  Allergies.  Dryness in the air.  Smoke or pollution.  Gastroesophageal reflux disease (GERD).  A tumor.  A sore throat can be the first sign of another sickness. It can happen with other problems, like coughing or a fever. Most sore throats go away without treatment. Follow these instructions at home:  Take over-the-counter medicines only as told by your doctor.  Drink enough fluids to keep your pee (urine) clear or pale yellow.  Rest when you feel you need to.  To help with pain, try: ? Sipping warm liquids, such as broth, herbal tea, or warm water. ? Eating or drinking cold or frozen liquids, such as frozen ice pops. ? Gargling with a salt-water mixture 3-4 times a day or as needed. To make a salt-water mixture, add -1 tsp of salt in 1 cup of warm water.  Mix it until you cannot see the salt anymore. ? Sucking on hard candy or throat lozenges. ? Putting a cool-mist humidifier in your bedroom at night. ? Sitting in the bathroom with the door closed for 5-10 minutes while you run hot water in the shower.  Do not use any tobacco products, such as cigarettes, chewing tobacco, and e-cigarettes. If you need help quitting, ask your doctor. Contact a doctor if:  You have a fever for more than 2-3 days.  You keep having symptoms for more than 2-3 days.  Your throat does not get better in 7 days.  You have a fever and your symptoms suddenly get worse. Get help right away if:  You have trouble breathing.  You cannot swallow fluids, soft foods, or your saliva.  You have swelling in your throat or neck that gets worse.  You keep feeling like you are going to throw up (vomit).  You keep throwing up. This information is not intended to replace advice given to you by your health care provider. Make sure you discuss any questions you have with your health care provider. Document Released: 05/01/2008 Document Revised: 03/18/2016 Document Reviewed: 05/13/2015 Elsevier Interactive Patient Education  [employer].

## 2018-05-23 ENCOUNTER — Telehealth: Payer: Self-pay | Admitting: Emergency Medicine

## 2018-05-23 NOTE — Telephone Encounter (Signed)
Left message follow up call from [facility] visit 

## 2018-05-26 ENCOUNTER — Encounter: Payer: Self-pay | Admitting: Family Medicine

## 2018-05-26 ENCOUNTER — Ambulatory Visit (INDEPENDENT_AMBULATORY_CARE_PROVIDER_SITE_OTHER): Payer: No Typology Code available for payment source | Admitting: Family Medicine

## 2018-05-26 VITALS — BP 118/70 | HR 82 | Temp 98.1°F | Ht 62.0 in | Wt 219.8 lb

## 2018-05-26 DIAGNOSIS — J019 Acute sinusitis, unspecified: Secondary | ICD-10-CM | POA: Insufficient documentation

## 2018-05-26 DIAGNOSIS — J0110 Acute frontal sinusitis, unspecified: Secondary | ICD-10-CM

## 2018-05-26 HISTORY — DX: Acute sinusitis, unspecified: J01.90

## 2018-05-26 MED ORDER — SULFAMETHOXAZOLE-TRIMETHOPRIM 800-160 MG PO TABS: 1.0000 | ORAL_TABLET | Freq: Two times a day (BID) | ORAL | 0 refills | Status: DC

## 2018-05-26 NOTE — Assessment & Plan Note (Signed)
S/p uri with ST -with changed symptoms  Now much more sinus pressure and headache  Diff incl viral and bacterial cause  In light of worsening symptoms- suspect bacterial  Cover with bactrim DS bid for 7d (given her medication intolerances)  Enc her to use nasal saline Fluids/rest Disc symptomatic care - see instructions on AVS  Update if not starting to improve in a week or if worsening   May consider prednisone course if no imp  Meds ordered this encounter  Medications  . sulfamethoxazole-trimethoprim (BACTRIM DS,SEPTRA DS) 800-160 MG tablet    Sig: Take 1 tablet by mouth 2 (two) times daily.    Dispense:  14 tablet    Refill:  0

## 2018-05-26 NOTE — Progress Notes (Signed)
Subjective:    Patient ID: Kristen [name], female    DOB: 1971/07/29, 47 y.o.   MRN: [id number]  HPI Here for ongoing upper resp symptoms   Seen on 10/16 - [facility]  [place] Kristen [name] treated her  2 d of ST Neg strep  tx as viral pharyngitis Benzocaine throat lozenges and flonase   Throat did improve / pain is improved Sinus congestion is worse Not getting much out of her nose-like it is stuck   Pressure and pain around her eyes  Thinks she may have a sinus infection   No fever  (has had chills on and off at night)  Hard to sleep- is uncomfortable   Cough- mild / minimally productive - clear   Exhausted   Missed 3 d of work (and had to leave work today)  Patient Active Problem List   Diagnosis Date Noted  . Acute sinusitis 05/26/2018  . GAD (generalized anxiety disorder) 04/25/2018  . Chest pain 09/04/2017  . Hot flashes 05/28/2017  . Obesity (BMI 30-39.9) 09/03/2016  . Intracranial hypertension, benign 06/22/2016  . Visual disturbance 05/18/2016  . Other fatigue 03/01/2016  . Preventative health care 01/03/2016  . Insomnia 11/29/2015  . Essential hypertension 11/11/2015  . GERD (gastroesophageal reflux disease) 11/11/2015   Past Medical History:  Diagnosis Date  . Abnormal Pap smear of cervix    dysplasia   . Anemia    history of  . Anxiety   . Asthma    seasonal asthma, has not required use of inhaler in over 2 years  . Cervical dysplasia 1993  . Depression   . Diverticulitis   . Dysmenorrhea   . Essential hypertension   . GERD (gastroesophageal reflux disease)   . Headache    history of migraines  . IBS (irritable bowel syndrome)   . STD (sexually transmitted disease) ~1998   chlamydia    Past Surgical History:  Procedure Laterality Date  . ADENOIDECTOMY     lymph nodes removed  . APPENDECTOMY  1990  . COLONOSCOPY    . COLPOSCOPY  1992  . CYSTOSCOPY N/A 02/12/2017   Procedure: CYSTOSCOPY;  Surgeon: Jerene Bears, MD;  Location: [facility];   Service: Gynecology;  Laterality: N/A;  . TONSILLECTOMY    . TOTAL LAPAROSCOPIC HYSTERECTOMY WITH SALPINGECTOMY Bilateral 02/12/2017   Procedure: HYSTERECTOMY TOTAL LAPAROSCOPIC WITH SALPINGECTOMY;  Surgeon: Jerene Bears, MD;  Location: [facility];  Service: Gynecology;  Laterality: Bilateral;  . TUBAL LIGATION     Social History   Tobacco Use  . Smoking status: Never Smoker  . Smokeless tobacco: Never Used  Substance Use Topics  . Alcohol use: No    Alcohol/week: 0.0 standard drinks  . Drug use: No   Family History  Problem Relation Age of Onset  . Hypertension Mother   . Depression Mother   . Diabetes Maternal Grandmother   . Hypertension Maternal Grandmother   . Colon cancer Father 53  . Diabetes Brother   . Stomach cancer Neg Hx    Allergies  Allergen Reactions  . Amoxicillin Nausea Only    Has patient had a PCN reaction causing immediate rash, facial/tongue/throat swelling, SOB or lightheadedness with hypotension: No Has patient had a PCN reaction causing severe rash involving mucus membranes or skin necrosis: No Has patient had a PCN reaction that required hospitalization: No Has patient had a PCN reaction occurring within the last 10 years: Yes If all of the above answers are "NO", then may proceed with  Cephalosporin use.   Marland Kitchen Doxycycline   . Penicillin G Rash    Has patient had a PCN reaction causing immediate rash, facial/tongue/throat swelling, SOB or lightheadedness with hypotension: Unknown Has patient had a PCN reaction causing severe rash involving mucus membranes or skin necrosis: Unknown Has patient had a PCN reaction that required hospitalization: Unknown Has patient had a PCN reaction occurring within the last 10 years: No If all of the above answers are "NO", then may proceed with Cephalosporin use.    Current Outpatient Medications on File Prior to Visit  Medication Sig Dispense Refill  . amLODipine-benazepril (LOTREL) 10-20 MG capsule Take 1 capsule by  mouth daily. 30 capsule 0  . Benzocaine 15 MG LOZG Use as directed 1 lozenge (15 mg total) in the mouth or throat every 2 (two) hours as needed for up to 7 days (sore throat). 50 lozenge 0  . busPIRone (BUSPAR) 7.5 MG tablet Take 1 tablet by mouth twice daily for anxiety. 60 tablet 1  . Estradiol 0.5 MG/0.5GM GEL Apply 1 packet topically daily. 30 each 1  . fluticasone (FLONASE) 50 MCG/ACT nasal spray Place 2 sprays into both nostrils daily for 7 days. 16 g 0  . nystatin (MYCOSTATIN/NYSTOP) powder Apply topically 2 (two) times daily. Apply daily to affected skin 45 g 2  . pantoprazole (PROTONIX) 40 MG tablet Take 1 tablet by mouth daily.    . progesterone (PROMETRIUM) 100 MG capsule Take 1 capsule (100 mg total) by mouth at bedtime. 90 capsule 0  . temazepam (RESTORIL) 15 MG capsule Take 1 capsule (15 mg total) by mouth at bedtime as needed for sleep. 30 capsule 1   No current facility-administered medications on file prior to visit.     Review of Systems  Constitutional: Positive for appetite change. Negative for fatigue and fever.  HENT: Positive for congestion, ear pain, postnasal drip, rhinorrhea, sinus pressure and voice change. Negative for nosebleeds, sore throat and trouble swallowing.   Eyes: Negative for pain, redness and itching.  Respiratory: Positive for cough. Negative for shortness of breath and wheezing.   Cardiovascular: Negative for chest pain.  Gastrointestinal: Negative for abdominal pain, diarrhea, nausea and vomiting.  Endocrine: Negative for polyuria.  Genitourinary: Negative for dysuria, frequency and urgency.  Musculoskeletal: Negative for arthralgias and myalgias.  Allergic/Immunologic: Negative for immunocompromised state.  Neurological: Positive for headaches. Negative for dizziness, tremors, syncope, weakness and numbness.  Hematological: Negative for adenopathy. Does not bruise/bleed easily.  Psychiatric/Behavioral: Negative for dysphoric mood. The patient is  not nervous/anxious.        Objective:   Physical Exam  Constitutional: She appears well-developed and well-nourished. No distress.  overwt and fatigued appearing  HENT:  Head: Normocephalic and atraumatic.  Right Ear: External ear normal.  Left Ear: External ear normal.  Mouth/Throat: Oropharynx is clear and moist. No oropharyngeal exudate.  Nares are injected and congested  Bilateral mild frontal sinus tenderness  Post nasal drip   Voice is hoarse-mild  Eyes: Pupils are equal, round, and reactive to light. Conjunctivae and EOM are normal. Right eye exhibits no discharge. Left eye exhibits no discharge.  Neck: Normal range of motion. Neck supple.  Cardiovascular: Normal rate and regular rhythm.  Pulmonary/Chest: Effort normal and breath sounds normal. No stridor. No respiratory distress. She has no wheezes. She has no rales.  Good air exch No rales or rhonchi  Lymphadenopathy:    She has no cervical adenopathy.  Neurological: She is alert. No cranial nerve deficit.  Skin: Skin is warm and dry. No rash noted.  Psychiatric: She has a normal mood and affect.          Assessment & Plan:   Problem List Items Addressed This Visit      Respiratory   Acute sinusitis - Primary    S/p uri with ST -with changed symptoms  Now much more sinus pressure and headache  Diff incl viral and bacterial cause  In light of worsening symptoms- suspect bacterial  Cover with bactrim DS bid for 7d (given her medication intolerances)  Enc her to use nasal saline Fluids/rest Disc symptomatic care - see instructions on AVS  Update if not starting to improve in a week or if worsening   May consider prednisone course if no imp  Meds ordered this encounter  Medications  . sulfamethoxazole-trimethoprim (BACTRIM DS,SEPTRA DS) 800-160 MG tablet    Sig: Take 1 tablet by mouth 2 (two) times daily.    Dispense:  14 tablet    Refill:  0         Relevant Medications    sulfamethoxazole-trimethoprim (BACTRIM DS,SEPTRA DS) 800-160 MG tablet

## 2018-05-26 NOTE — Patient Instructions (Addendum)
Use nasal saline spray as needed- to irrigate sinuses  If flonase does not help-do not bother   Ibuprofen or aleve will help facial pressure and pain and chills/fever  Drink lots of water Breathe steam Take bactrim ds as directed  mucinex plain- is also helpful for congestion in head and chest    If worse- will add prednisone   Update if not starting to improve in a week or if worsening

## 2018-05-27 ENCOUNTER — Other Ambulatory Visit: Payer: No Typology Code available for payment source

## 2018-05-29 ENCOUNTER — Other Ambulatory Visit (INDEPENDENT_AMBULATORY_CARE_PROVIDER_SITE_OTHER): Payer: No Typology Code available for payment source

## 2018-05-29 DIAGNOSIS — I10 Essential (primary) hypertension: Secondary | ICD-10-CM | POA: Diagnosis not present

## 2018-05-29 DIAGNOSIS — R252 Cramp and spasm: Secondary | ICD-10-CM

## 2018-05-29 DIAGNOSIS — Z0000 Encounter for general adult medical examination without abnormal findings: Secondary | ICD-10-CM

## 2018-05-29 LAB — COMPREHENSIVE METABOLIC PANEL
ALT: 17 U/L (ref 0–35)
AST: 18 U/L (ref 0–37)
Albumin: 4.1 g/dL (ref 3.5–5.2)
Alkaline Phosphatase: 38 U/L — ABNORMAL LOW (ref 39–117)
BUN: 14 mg/dL (ref 6–23)
CO2: 24 mEq/L (ref 19–32)
Calcium: 9 mg/dL (ref 8.4–10.5)
Chloride: 106 mEq/L (ref 96–112)
Creatinine, Ser: 1.11 mg/dL (ref 0.40–1.20)
GFR: 67.81 mL/min (ref >60.00)
Glucose, Bld: 84 mg/dL (ref 70–99)
Potassium: 3.7 mEq/L (ref 3.5–5.1)
Sodium: 138 mEq/L (ref 135–145)
Total Bilirubin: 0.7 mg/dL (ref 0.2–1.2)
Total Protein: 7.5 g/dL (ref 6.0–8.3)

## 2018-05-29 LAB — LIPID PANEL
Cholesterol: 126 mg/dL (ref 0–200)
HDL: 40.1 mg/dL (ref >39.00)
LDL Cholesterol: 74 mg/dL (ref 0–99)
NonHDL: 85.86
Total CHOL/HDL Ratio: 3
Triglycerides: 61 mg/dL (ref 0.0–149.0)
VLDL: 12.2 mg/dL (ref 0.0–40.0)

## 2018-05-29 LAB — HEMOGLOBIN A1C: Hgb A1c MFr Bld: 5.3 % (ref 4.6–6.5)

## 2018-05-29 LAB — MAGNESIUM: Magnesium: 2.1 mg/dL (ref 1.5–2.5)

## 2018-05-29 LAB — TSH: TSH: 2.74 u[IU]/mL (ref 0.35–4.50)

## 2018-05-30 ENCOUNTER — Encounter: Payer: No Typology Code available for payment source | Admitting: Primary Care

## 2018-05-30 ENCOUNTER — Encounter: Payer: Self-pay | Admitting: Primary Care

## 2018-05-30 ENCOUNTER — Ambulatory Visit (INDEPENDENT_AMBULATORY_CARE_PROVIDER_SITE_OTHER): Payer: No Typology Code available for payment source | Admitting: Primary Care

## 2018-05-30 VITALS — BP 124/80 | HR 69 | Temp 98.1°F | Ht 62.0 in | Wt 216.8 lb

## 2018-05-30 DIAGNOSIS — Z1239 Encounter for other screening for malignant neoplasm of breast: Secondary | ICD-10-CM

## 2018-05-30 DIAGNOSIS — I10 Essential (primary) hypertension: Secondary | ICD-10-CM

## 2018-05-30 DIAGNOSIS — G4700 Insomnia, unspecified: Secondary | ICD-10-CM

## 2018-05-30 DIAGNOSIS — E669 Obesity, unspecified: Secondary | ICD-10-CM

## 2018-05-30 DIAGNOSIS — G8929 Other chronic pain: Secondary | ICD-10-CM

## 2018-05-30 DIAGNOSIS — Z0000 Encounter for general adult medical examination without abnormal findings: Secondary | ICD-10-CM

## 2018-05-30 DIAGNOSIS — M25551 Pain in right hip: Secondary | ICD-10-CM

## 2018-05-30 DIAGNOSIS — L678 Other hair color and hair shaft abnormalities: Secondary | ICD-10-CM

## 2018-05-30 DIAGNOSIS — F411 Generalized anxiety disorder: Secondary | ICD-10-CM

## 2018-05-30 DIAGNOSIS — R232 Flushing: Secondary | ICD-10-CM

## 2018-05-30 DIAGNOSIS — K219 Gastro-esophageal reflux disease without esophagitis: Secondary | ICD-10-CM

## 2018-05-30 MED ORDER — AMLODIPINE BESY-BENAZEPRIL HCL 10-20 MG PO CAPS: 1.0000 | ORAL_CAPSULE | Freq: Every day | ORAL | 3 refills | Status: DC

## 2018-05-30 NOTE — Assessment & Plan Note (Addendum)
Improved on Amlodipine-benazepril 10-20 mg, continue same. Denies chest pain, dizziness.

## 2018-05-30 NOTE — Assessment & Plan Note (Signed)
Using Temazepam PRN, last dose 2-3 weeks ago. Continue same.

## 2018-05-30 NOTE — Progress Notes (Signed)
Subjective:    Patient ID: Kristen [name], female    DOB: 09/08/1970, 47 y.o.   MRN: [id number]  HPI  [name] is a 47 year old female who presents today for complete physical.  She is curious to learn if there's any other treatment to decrease facial hair besides spironolactone and OTC Darene Lamer. She's been using Darene Lamer OTC without significant improvement.   Immunizations: -Tetanus: Completed in 2016 -Influenza: Completed this season   Diet: She endorses a fair diet Breakfast: Skips Lunch: Some fast food, left overs Dinner: Meat, vegetables, starch, fast food Snacks: None Desserts: 2-3 times weekly Beverages: Amino acid drink, water, sweet tea  Exercise: She recently started exercising at the gym three days weekly for one hour. Eye exam: Completed in 2019 Dental exam: No recent exam Pap Smear: Completed in 2018 Mammogram: Due  BP Readings from Last 3 Encounters:  05/30/18 124/80  05/26/18 118/70  05/21/18 130/90      Review of Systems  Constitutional: Negative for unexpected weight change.  HENT: Negative for rhinorrhea.   Respiratory: Negative for cough and shortness of breath.   Cardiovascular: Negative for chest pain.  Gastrointestinal: Negative for constipation and diarrhea.  Genitourinary: Negative for difficulty urinating and menstrual problem.  Musculoskeletal: Negative for arthralgias and myalgias.  Skin: Negative for rash.  Allergic/Immunologic: Negative for environmental allergies.  Neurological: Negative for dizziness, numbness and headaches.  Psychiatric/Behavioral:       She does struggle with daily anxiety, overall manages on her own.       Past Medical History:  Diagnosis Date  . Abnormal Pap smear of cervix    dysplasia   . Anemia    history of  . Anxiety   . Asthma    seasonal asthma, has not required use of inhaler in over 2 years  . Cervical dysplasia 1993  . Depression   . Diverticulitis   . Dysmenorrhea   . Essential hypertension    . GERD (gastroesophageal reflux disease)   . Headache    history of migraines  . IBS (irritable bowel syndrome)   . STD (sexually transmitted disease) ~1998   chlamydia      Social History   Socioeconomic History  . Marital status: Married    Spouse name: Not on file  . Number of children: 3  . Years of education: 39  . Highest education level: Not on file  Occupational History    Comment: [employer], Health Coach  Social Needs  . Financial resource strain: Not on file  . Food insecurity:    Worry: Not on file    Inability: Not on file  . Transportation needs:    Medical: Not on file    Non-medical: Not on file  Tobacco Use  . Smoking status: Never Smoker  . Smokeless tobacco: Never Used  Substance and Sexual Activity  . Alcohol use: No    Alcohol/week: 0.0 standard drinks  . Drug use: No  . Sexual activity: Not Currently    Partners: Male    Birth control/protection: Surgical  Lifestyle  . Physical activity:    Days per week: Not on file    Minutes per session: Not on file  . Stress: Not on file  Relationships  . Social connections:    Talks on phone: Not on file    Gets together: Not on file    Attends religious service: Not on file    Active member of club or organization: Not on file  Attends meetings of clubs or organizations: Not on file    Relationship status: Not on file  . Intimate partner violence:    Fear of current or ex partner: Not on file    Emotionally abused: Not on file    Physically abused: Not on file    Forced sexual activity: Not on file  Other Topics Concern  . Not on file  Social History Narrative   Separated.    3 children.    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     Past Surgical History:  Procedure Laterality Date  . ADENOIDECTOMY     lymph nodes removed  . APPENDECTOMY  1990  . COLONOSCOPY    . COLPOSCOPY  1992  . CYSTOSCOPY N/A 02/12/2017   Procedure:  CYSTOSCOPY;  Surgeon: Jerene Bears, MD;  Location: [facility];  Service: Gynecology;  Laterality: N/A;  . TONSILLECTOMY    . TOTAL LAPAROSCOPIC HYSTERECTOMY WITH SALPINGECTOMY Bilateral 02/12/2017   Procedure: HYSTERECTOMY TOTAL LAPAROSCOPIC WITH SALPINGECTOMY;  Surgeon: Jerene Bears, MD;  Location: [facility];  Service: Gynecology;  Laterality: Bilateral;  . TUBAL LIGATION      Family History  Problem Relation Age of Onset  . Hypertension Mother   . Depression Mother   . Diabetes Maternal Grandmother   . Hypertension Maternal Grandmother   . Colon cancer Father 60  . Diabetes Brother   . Stomach cancer Neg Hx     Allergies  Allergen Reactions  . Amoxicillin Nausea Only    Has patient had a PCN reaction causing immediate rash, facial/tongue/throat swelling, SOB or lightheadedness with hypotension: No Has patient had a PCN reaction causing severe rash involving mucus membranes or skin necrosis: No Has patient had a PCN reaction that required hospitalization: No Has patient had a PCN reaction occurring within the last 10 years: Yes If all of the above answers are "NO", then may proceed with Cephalosporin use.   Marland Kitchen Doxycycline   . Penicillin G Rash    Has patient had a PCN reaction causing immediate rash, facial/tongue/throat swelling, SOB or lightheadedness with hypotension: Unknown Has patient had a PCN reaction causing severe rash involving mucus membranes or skin necrosis: Unknown Has patient had a PCN reaction that required hospitalization: Unknown Has patient had a PCN reaction occurring within the last 10 years: No If all of the above answers are "NO", then may proceed with Cephalosporin use.     Current Outpatient Medications on File Prior to Visit  Medication Sig Dispense Refill  . amLODipine-benazepril (LOTREL) 10-20 MG capsule Take 1 capsule by mouth daily. 30 capsule 0  . Estradiol 0.5 MG/0.5GM GEL Apply 1 packet topically daily. 30 each 1  . nystatin (MYCOSTATIN/NYSTOP)  powder Apply topically 2 (two) times daily. Apply daily to affected skin 45 g 2  . pantoprazole (PROTONIX) 40 MG tablet Take 1 tablet by mouth daily.    . progesterone (PROMETRIUM) 100 MG capsule Take 1 capsule (100 mg total) by mouth at bedtime. 90 capsule 0  . sulfamethoxazole-trimethoprim (BACTRIM DS,SEPTRA DS) 800-160 MG tablet Take 1 tablet by mouth 2 (two) times daily. 14 tablet 0  . temazepam (RESTORIL) 15 MG capsule Take 1 capsule (15 mg total) by mouth at bedtime as needed for sleep. 30 capsule 1   No current facility-administered medications on file prior to visit.     BP 124/80   Pulse 69   Temp 98.1 F (36.7 C) (Oral)  Ht 5\' 2"  (1.575 m)   Wt 216 lb 12 oz (98.3 kg)   LMP 01/27/2017 (Exact Date)   SpO2 99%   BMI 39.64 kg/m    Objective:   Physical Exam  Constitutional: She is oriented to person, place, and time. She appears well-nourished.  HENT:  Mouth/Throat: No oropharyngeal exudate.  Eyes: Pupils are equal, round, and reactive to light. EOM are normal.  Neck: Neck supple. No thyromegaly present.  Cardiovascular: Normal rate and regular rhythm.  Respiratory: Effort normal and breath sounds normal.  GI: Soft. Bowel sounds are normal. There is no tenderness.  Musculoskeletal: Normal range of motion.  Neurological: She is alert and oriented to person, place, and time.  Skin: Skin is warm and dry.  Scant amount of facial hair to lower and under chin.   Psychiatric: She has a normal mood and affect.           Assessment & Plan:

## 2018-05-30 NOTE — Assessment & Plan Note (Signed)
Doing well on current regimen, followed by GYN. Continue same.

## 2018-05-30 NOTE — Assessment & Plan Note (Signed)
Located to chin and just below.  A1C of 5.3, TSH WNL.  Will have her work on weight loss through diet and exercise.

## 2018-05-30 NOTE — Assessment & Plan Note (Signed)
Overall struggles with some anxiety, does not take Buspar as she doesn't want to take anything on a regular basis. Continue to monitor.

## 2018-05-30 NOTE — Assessment & Plan Note (Signed)
Using pantoprazole 40 mg PRN with improvement, continue same.

## 2018-05-30 NOTE — Patient Instructions (Addendum)
Continue amlodipine-benazepril 10-20 mg for blood pressure. I sent refills to your pharmacy.  Continue exercising. You should be getting 150 minutes of moderate intensity exercise weekly.  It's important to improve your diet by reducing consumption of fast food, fried food, processed snack foods, sugary drinks. Increase consumption of fresh vegetables and fruits, whole grains, water.  Ensure you are drinking 64 ounces of water daily.  It was a pleasure to see you today!   Preventive Care 40-64 Years, Female Preventive care refers to lifestyle choices and visits with your health care provider that can promote health and wellness. What does preventive care include?  A yearly physical exam. This is also called an annual well check.  Dental exams once or twice a year.  Routine eye exams. Ask your health care provider how often you should have your eyes checked.  Personal lifestyle choices, including: ? Daily care of your teeth and gums. ? Regular physical activity. ? Eating a healthy diet. ? Avoiding tobacco and drug use. ? Limiting alcohol use. ? Practicing safe sex. ? Taking low-dose aspirin daily starting at age 73. ? Taking vitamin and mineral supplements as recommended by your health care provider. What happens during an annual well check? The services and screenings done by your health care provider during your annual well check will depend on your age, overall health, lifestyle risk factors, and family history of disease. Counseling Your health care provider may ask you questions about your:  Alcohol use.  Tobacco use.  Drug use.  Emotional well-being.  Home and relationship well-being.  Sexual activity.  Eating habits.  Work and work Statistician.  Method of birth control.  Menstrual cycle.  Pregnancy history.  Screening You may have the following tests or measurements:  Height, weight, and BMI.  Blood pressure.  Lipid and cholesterol levels. These  may be checked every 5 years, or more frequently if you are over 83 years old.  Skin check.  Lung cancer screening. You may have this screening every year starting at age 38 if you have a 30-pack-year history of smoking and currently smoke or have quit within the past 15 years.  Fecal occult blood test (FOBT) of the stool. You may have this test every year starting at age 71.  Flexible sigmoidoscopy or colonoscopy. You may have a sigmoidoscopy every 5 years or a colonoscopy every 10 years starting at age 16.  Hepatitis C blood test.  Hepatitis B blood test.  Sexually transmitted disease (STD) testing.  Diabetes screening. This is done by checking your blood sugar (glucose) after you have not eaten for a while (fasting). You may have this done every 1-3 years.  Mammogram. This may be done every 1-2 years. Talk to your health care provider about when you should start having regular mammograms. This may depend on whether you have a family history of breast cancer.  BRCA-related cancer screening. This may be done if you have a family history of breast, ovarian, tubal, or peritoneal cancers.  Pelvic exam and Pap test. This may be done every 3 years starting at age 53. Starting at age 102, this may be done every 5 years if you have a Pap test in combination with an HPV test.  Bone density scan. This is done to screen for osteoporosis. You may have this scan if you are at high risk for osteoporosis.  Discuss your test results, treatment options, and if necessary, the need for more tests with your health care provider. Vaccines Your health  care provider may recommend certain vaccines, such as:  Influenza vaccine. This is recommended every year.  Tetanus, diphtheria, and acellular pertussis (Tdap, Td) vaccine. You may need a Td booster every 10 years.  Varicella vaccine. You may need this if you have not been vaccinated.  Zoster vaccine. You may need this after age 35.  Measles, mumps,  and rubella (MMR) vaccine. You may need at least one dose of MMR if you were born in 1957 or later. You may also need a second dose.  Pneumococcal 13-valent conjugate (PCV13) vaccine. You may need this if you have certain conditions and were not previously vaccinated.  Pneumococcal polysaccharide (PPSV23) vaccine. You may need one or two doses if you smoke cigarettes or if you have certain conditions.  Meningococcal vaccine. You may need this if you have certain conditions.  Hepatitis A vaccine. You may need this if you have certain conditions or if you travel or work in places where you may be exposed to hepatitis A.  Hepatitis B vaccine. You may need this if you have certain conditions or if you travel or work in places where you may be exposed to hepatitis B.  Haemophilus influenzae type b (Hib) vaccine. You may need this if you have certain conditions.  Talk to your health care provider about which screenings and vaccines you need and how often you need them. This information is not intended to replace advice given to you by your health care provider. Make sure you discuss any questions you have with your health care provider. Document Released: 08/19/2015 Document Revised: 04/11/2016 Document Reviewed: 05/24/2015 Elsevier Interactive Patient Education  [employer].

## 2018-05-30 NOTE — Assessment & Plan Note (Signed)
Recently start an exercise regimen. Discussed to work on diet, encouraged regular exercise.

## 2018-05-30 NOTE — Assessment & Plan Note (Signed)
Immunizations UTD. Pap smear UTD. Mammogram due, ordered. Discussed the importance of a healthy diet and regular exercise in order for weight loss, and to reduce the risk of any potential medical problems. Discussed to work on diet, continue with regular exercise.  Exam unremarkable. Labs reviewed. Follow up in 1 year for annual exam.

## 2018-05-30 NOTE — Assessment & Plan Note (Addendum)
History of trochanter bursitis years ago. Has noticed increased pain as she's just started exercising. Taking Norco HS after workouts, this was provided by GYN after hysterectomy, no improvement with NSAID's.   Discussed to avoid use of narcotics for pain. Discussed to try taking Ibuprofen 600-800 mg prior to work outs for now until her body adjusts. Also discussed stretching exercises.

## 2018-06-12 ENCOUNTER — Telehealth: Payer: Self-pay | Admitting: Obstetrics & Gynecology

## 2018-06-12 ENCOUNTER — Encounter: Payer: Self-pay | Admitting: Obstetrics & Gynecology

## 2018-06-12 NOTE — Telephone Encounter (Signed)
Kristen [name] 6 minutes ago (3:53 PM)      Patient is asking to talk with a Dr.Miller's nurse about getting her progesterone refill. Patient would like to have labs done before getting the refill. She said she did send a MyChart message.

## 2018-06-12 NOTE — Telephone Encounter (Signed)
Patient is asking to talk with a Dr.Miller's nurse about getting her progesterone refill. Patient would like to have labs done before getting the refill. She said she did send a MyChart message.

## 2018-06-12 NOTE — Telephone Encounter (Signed)
Patient sent the following message through MyChart. Routing to triage to assist patient with request.   Dr. Hyacinth Meeker,    Can you please place future lab orders for me to have my estrogen, progesterone, and testosterone levels drawn? If you put the orders in, my PCP can reorder them so that the lab in our office can do them. They will be then be in [facility] for you to see them.     I know we initially discussed taking testosterone due to potential side effects. Also, I have now tried multiple antidepressants and none of them have been effective. So I am figuring it may be time to reassess my hormonal levels. I need a refill of the progesterone but I want to see what my level is before you send in the prescription.     Thank you and I hope all is well.    Kristen [name]

## 2018-06-12 NOTE — Telephone Encounter (Signed)
Duplicate message. Will close encounter.  

## 2018-06-13 ENCOUNTER — Other Ambulatory Visit: Payer: Self-pay | Admitting: Obstetrics & Gynecology

## 2018-06-13 DIAGNOSIS — N951 Menopausal and female climacteric states: Secondary | ICD-10-CM

## 2018-06-13 MED ORDER — PROGESTERONE MICRONIZED 100 MG PO CAPS: 100.0000 mg | ORAL_CAPSULE | Freq: Every day | ORAL | 1 refills | Status: DC

## 2018-06-13 NOTE — Telephone Encounter (Signed)
Order for future labs placed.  We will call with results.

## 2018-06-13 NOTE — Telephone Encounter (Signed)
Spoke with patient and advised labs ordered.  She states she is out of prometrium and has been out for some time. She was taking it every other night and then ran out. She is wanted to have her levels drawn prior to starting it again, so will start her Prometrium again after lab draw on Monday morning. Reminded patient to have mammogram done as well.  Encounter closed.

## 2018-06-13 NOTE — Telephone Encounter (Signed)
Could you also confirm with her how she is taking her prometrium as I did a RF for #90/0RF in 2/19 and I have not had a refill request come through.  It looks like she may not be taking it.

## 2018-06-13 NOTE — Telephone Encounter (Addendum)
Dr. Hyacinth Meeker,  Can you review patient request for labs testing?  Testosterone and Progesterone last drawn 09/2017.   Also, pt requests refill of prometrium, okay to refill?    Spoke with patient. Not on any antidepressants.  Wondering if hormones need to be adjusted or add on testosterone. Feels she may need to try testosterone if Dr. Hyacinth Meeker agrees.

## 2018-06-18 ENCOUNTER — Telehealth: Payer: Self-pay | Admitting: Primary Care

## 2018-06-18 NOTE — Telephone Encounter (Signed)
Patient sent staff message to have hormone labs drawn in our clinic.  Kristen [name] or Kristen [name], can she have them drawn here by her GYN or do I have to re-order?

## 2018-06-18 NOTE — Telephone Encounter (Signed)
Noted and appreciated

## 2018-06-18 NOTE — Telephone Encounter (Signed)
-----   Message from Robert Bellowarlesia R Pinson, LPN sent at 16/10/960411/13/2019  8:10 AM EST ----- Regarding: Please reorder hormone labs Jae DireKate,  Dr. Hyacinth MeekerMiller placed some hormone labs in Epic. Will you please reorder them so that the lab here can draw them?  Thank you,   [name]

## 2018-06-18 NOTE — Telephone Encounter (Signed)
We can draw the labs here and send them to [facility]. I'll let patient know to schedule a lab appointment. Thanks!

## 2018-06-19 ENCOUNTER — Other Ambulatory Visit (INDEPENDENT_AMBULATORY_CARE_PROVIDER_SITE_OTHER): Payer: No Typology Code available for payment source

## 2018-06-19 DIAGNOSIS — N951 Menopausal and female climacteric states: Secondary | ICD-10-CM

## 2018-06-21 LAB — TESTOSTERONE, TOTAL, LC/MS/MS: Testosterone, total: 12.8 ng/dL

## 2018-06-21 LAB — PROGESTERONE: Progesterone: 5.3 ng/mL

## 2018-06-21 LAB — ESTRADIOL: Estradiol: 117.9 pg/mL

## 2018-08-28 ENCOUNTER — Other Ambulatory Visit: Payer: Self-pay | Admitting: Obstetrics & Gynecology

## 2018-08-28 ENCOUNTER — Encounter: Payer: Self-pay | Admitting: Obstetrics & Gynecology

## 2018-08-28 NOTE — Telephone Encounter (Signed)
Medication refill request: Divigel Last OV:03/28/18 Next AEX: nothing scheduled at this time.  Last MMG (if hormonal medication request): 12/04/16 Bi-rads 1 neg  Refill authorized: 30 with 0 rf   Medication refill request: Restoril  Last OV:03/28/18 Next AEX: nothing scheduled at this time.  Last MMG (if hormonal medication request): 12/04/16 Bi-rads 1 neg  Refill authorized: #30 with 0 RF   Please advise. Patient sent a myChart message stating :  Dr. Hyacinth Meeker,   I am resuming use of Progesterone 100 mg. I had 1 additional refill remaining. My anger and my insomnia have gotten progressively worse. My husband states he is walking on egg shells because one minute I am happy and the next minute I get angry over the smallest things. I am constantly apologizing to him, my daughter, and my mom for my outbursts of anger. Additionally, I am having these sudden attacks of deep depression for no reason. My life is wonderful. I have no reason to feel this depressed. So I attribute all of these changes to the lack of progesterone. I am not taking any antidepressants because they make me feel like a zombie and take all joy away.   If you would be so kind to refill Progesterone, Restoril, and Divigel. The pharmacy is reaching out to you for the Restoril refill because I didn't use my refill within the 6 mth period so the prescription has expired. I have no more refills remaining on Divigel. Thank you. Hope all is well!!   Kristen [name]

## 2018-08-28 NOTE — Telephone Encounter (Signed)
Patient sent the following correspondence through MyChart. Routing to triage to assist patient with request.  Dr. Hyacinth Meeker,    I am resuming use of Progesterone 100 mg. I had 1 additional refill remaining. My anger and my insomnia have gotten progressively worse. My husband states he is walking on egg shells because one minute I am happy and the next minute I get angry over the smallest things. I am constantly apologizing to him, my daughter, and my mom for my outbursts of anger. Additionally, I am having these sudden attacks of deep depression for no reason. My life is wonderful. I have no reason to feel this depressed. So I attribute all of these changes to the lack of progesterone. I am not taking any antidepressants because they make me feel like a zombie and take all joy away.     If you would be so kind to refill Progesterone, Restoril, and Divigel. The pharmacy is reaching out to you for the Restoril refill because I didn't use my refill within the 6 mth period so the prescription has expired. I have no more refills remaining on Divigel. Thank you. Hope all is well!!    Virl Axe

## 2018-08-29 NOTE — Telephone Encounter (Signed)
Call to patient. Advised patient refills sent to [facility] pharmacy. Advised patient 6 month supply of progesterone sent on 06-13-18. Patient verbalized understanding. Patient scheduled for aex on 03-06-2019 at 0815. Patient agreeable to date and time of appointment.   Encounter closed.

## 2018-08-29 NOTE — Telephone Encounter (Signed)
Should plan AEX with her sometime in the late summer.  Please communicate with pt for scheduling.  Thanks.

## 2018-10-15 ENCOUNTER — Ambulatory Visit (INDEPENDENT_AMBULATORY_CARE_PROVIDER_SITE_OTHER): Payer: No Typology Code available for payment source | Admitting: Family Medicine

## 2018-10-15 ENCOUNTER — Other Ambulatory Visit: Payer: Self-pay

## 2018-10-15 ENCOUNTER — Encounter: Payer: Self-pay | Admitting: Family Medicine

## 2018-10-15 VITALS — BP 118/88 | HR 97 | Temp 97.9°F | Resp 16 | Ht 61.5 in | Wt 215.0 lb

## 2018-10-15 DIAGNOSIS — R05 Cough: Secondary | ICD-10-CM

## 2018-10-15 DIAGNOSIS — R059 Cough, unspecified: Secondary | ICD-10-CM

## 2018-10-15 NOTE — Patient Instructions (Signed)
Good to see you today  Please start your Claritin back, add Mucinex DM (generic is fine) to help with congestion in your nose and chest and to suppress cough  If not better in 5-7 days or if worse, please be in touch  Increase your fluid intake!  Keep up the good work with your healthy food choices

## 2018-10-15 NOTE — Progress Notes (Signed)
Subjective:    Patient ID: Kristen [name], female    DOB: 1971-01-18, 48 y.o.   MRN: [id number]  HPI This is a 48 yo female who presents today with cough x several weeks, on and off. Chest feels a little tight, no wheeze, no sputum. Feels congested at base of throat. More frequent cough started yesterday. Last week with some post nasal drainage, no sore throat, no ear pain. Some pressure around eyes. Little nasal drainage. Little water today. Has history of seasonal allergies, has not started oral antihistamine.   Has been taking amlodipine-benazepril at night. Started 10/19.   Has been making healthier food choices, cut out fast food and has had some weight loss.    Past Medical History:  Diagnosis Date  . Abnormal Pap smear of cervix    dysplasia   . Anemia    history of  . Anxiety   . Asthma    seasonal asthma, has not required use of inhaler in over 2 years  . Cervical dysplasia 1993  . Depression   . Diverticulitis   . Dysmenorrhea   . Essential hypertension   . GERD (gastroesophageal reflux disease)   . Headache    history of migraines  . IBS (irritable bowel syndrome)   . STD (sexually transmitted disease) ~1998   chlamydia    Past Surgical History:  Procedure Laterality Date  . ADENOIDECTOMY     lymph nodes removed  . APPENDECTOMY  1990  . COLONOSCOPY    . COLPOSCOPY  1992  . CYSTOSCOPY N/A 02/12/2017   Procedure: CYSTOSCOPY;  Surgeon: Jerene Bears, MD;  Location: [facility];  Service: Gynecology;  Laterality: N/A;  . TONSILLECTOMY    . TOTAL LAPAROSCOPIC HYSTERECTOMY WITH SALPINGECTOMY Bilateral 02/12/2017   Procedure: HYSTERECTOMY TOTAL LAPAROSCOPIC WITH SALPINGECTOMY;  Surgeon: Jerene Bears, MD;  Location: [facility];  Service: Gynecology;  Laterality: Bilateral;  . TUBAL LIGATION     Family History  Problem Relation Age of Onset  . Hypertension Mother   . Depression Mother   . Diabetes Maternal Grandmother   . Hypertension Maternal Grandmother   . Colon  cancer Father 39  . Diabetes Brother   . Stomach cancer Neg Hx    Social History   Tobacco Use  . Smoking status: Never Smoker  . Smokeless tobacco: Never Used  Substance Use Topics  . Alcohol use: No    Alcohol/week: 0.0 standard drinks  . Drug use: No      Review of Systems Per HPI    Objective:   Physical Exam Vitals signs reviewed.  Constitutional:      General: She is not in acute distress.    Appearance: Normal appearance. She is obese. She is not ill-appearing, toxic-appearing or diaphoretic.  HENT:     Head: Normocephalic and atraumatic.     Right Ear: Tympanic membrane, ear canal and external ear normal. There is no impacted cerumen.     Left Ear: Ear canal and external ear normal. There is no impacted cerumen.     Ears:     Comments: Left TM dull.     Nose: Congestion present.     Mouth/Throat:     Mouth: Mucous membranes are dry.     Pharynx: Oropharynx is clear. No oropharyngeal exudate or posterior oropharyngeal erythema.  Eyes:     Conjunctiva/sclera: Conjunctivae normal.  Neck:     Musculoskeletal: Normal range of motion and neck supple.  Cardiovascular:     Rate and  Rhythm: Normal rate and regular rhythm.     Heart sounds: Normal heart sounds.  Pulmonary:     Effort: Pulmonary effort is normal. No respiratory distress.     Breath sounds: Rhonchi (Left upper lobe, cleared with cough.) present. No wheezing or rales.  Skin:    General: Skin is warm and dry.  Neurological:     Mental Status: She is alert and oriented to person, place, and time.  Psychiatric:        Mood and Affect: Mood normal.        Behavior: Behavior normal.        Thought Content: Thought content normal.        Judgment: Judgment normal.       BP 118/88 (BP Location: Right Arm, Patient Position: Sitting)   Pulse 97   Temp 97.9 F (36.6 C) (Oral)   Resp 16   Ht 5' 1.5" (1.562 m)   Wt 215 lb (97.5 kg)   LMP 01/27/2017 (Exact Date)   SpO2 97%   BMI 39.97 kg/m  Wt  Readings from Last 3 Encounters:  10/15/18 215 lb (97.5 kg)  05/30/18 216 lb 12 oz (98.3 kg)  05/26/18 219 lb 12 oz (99.7 kg)       Assessment & Plan:  1. Cough - could be viral and/or seasonal allergies, low suspicion for ace inhibitor - will treat with otc symptomatic relief measures - follow up in 5-7 if no improvement, sooner if worsening symptoms -  Patient Instructions  Good to see you today  Please start your Claritin back, add Mucinex DM (generic is fine) to help with congestion in your nose and chest and to suppress cough  If not better in 5-7 days or if worse, please be in touch  Increase your fluid intake!  Keep up the good work with your healthy food choices     Olean Ree, FNP-BC  [facility], [facility]  10/15/2018 12:19 PM

## 2018-10-17 ENCOUNTER — Other Ambulatory Visit: Payer: Self-pay

## 2018-10-17 ENCOUNTER — Ambulatory Visit (INDEPENDENT_AMBULATORY_CARE_PROVIDER_SITE_OTHER): Payer: Self-pay | Admitting: Physician Assistant

## 2018-10-17 VITALS — BP 130/100 | HR 83 | Temp 98.1°F | Resp 20 | Wt 216.0 lb

## 2018-10-17 DIAGNOSIS — J101 Influenza due to other identified influenza virus with other respiratory manifestations: Secondary | ICD-10-CM

## 2018-10-17 DIAGNOSIS — R6889 Other general symptoms and signs: Secondary | ICD-10-CM

## 2018-10-17 LAB — POCT INFLUENZA A/B
Influenza A, POC: POSITIVE — AB
Influenza B, POC: NEGATIVE

## 2018-10-17 MED ORDER — OSELTAMIVIR PHOSPHATE 75 MG PO CAPS: 75.0000 mg | ORAL_CAPSULE | Freq: Two times a day (BID) | ORAL | 0 refills | Status: AC

## 2018-10-17 MED ORDER — ALBUTEROL SULFATE HFA 108 (90 BASE) MCG/ACT IN AERS: 2.0000 | INHALATION_SPRAY | RESPIRATORY_TRACT | 0 refills | Status: DC | PRN

## 2018-10-17 MED ORDER — PROMETHAZINE-DM 6.25-15 MG/5ML PO SYRP: 5.0000 mL | ORAL_SOLUTION | Freq: Four times a day (QID) | ORAL | 0 refills | Status: DC | PRN

## 2018-10-17 NOTE — Patient Instructions (Addendum)
Thank you for choosing [facility] for your health care needs.  You have been diagnosed with influenza (the flu)  Your rapid flu test was positive for strain A.  Take medications as prescribed: Meds ordered this encounter  Medications  . oseltamivir (TAMIFLU) 75 MG capsule    Sig: Take 1 capsule (75 mg total) by mouth 2 (two) times daily for 5 days.    Dispense:  10 capsule    Refill:  0    Order Specific Question:   Supervising Provider    Answer:   MILLER, BRIAN [[id number]]  . promethazine-dextromethorphan (PROMETHAZINE-DM) 6.25-15 MG/5ML syrup    Sig: Take 5 mLs by mouth 4 (four) times daily as needed for cough.    Dispense:  118 mL    Refill:  0    Order Specific Question:   Supervising Provider    Answer:   MILLER, BRIAN [[id number]]  . albuterol (PROVENTIL HFA;VENTOLIN HFA) 108 (90 Base) MCG/ACT inhaler    Sig: Inhale 2 puffs into the lungs every 4 (four) hours as needed.    Dispense:  1 Inhaler    Refill:  0    Order Specific Question:   Supervising Provider    Answer:   MILLER, BRIAN [[id number]]   Recommend increase fluids; water, Gatorade, diluted juice (1/2 juice and 1/2 water), or hot tea with lemon/honey. Rest. May use over the counter Tylenol or ibuprofen for pain/fever. May use Delsym or Robitussin-DM for cough.  Follow-up with family physician or urgent care in 4-5 days if symptoms not improving. Follow-up sooner with any worsening symptoms.  Hope you feel better soon!  Influenza, Adult Influenza is also called "the flu." It is an infection in the lungs, nose, and throat (respiratory tract). It is caused by a virus. The flu causes symptoms that are similar to symptoms of a cold. It also causes a high fever and body aches. The flu spreads easily from person to person (is contagious). Getting a flu shot (influenza vaccination) every year is the best way to prevent the flu. What are the causes? This condition is caused by the influenza virus. You can get the virus by:  Breathing  in droplets that are in the air from the cough or sneeze of a person who has the virus.  Touching something that has the virus on it (is contaminated) and then touching your mouth, nose, or eyes. What increases the risk? Certain things may make you more likely to get the flu. These include:  Not washing your hands often.  Having close contact with many people during cold and flu season.  Touching your mouth, eyes, or nose without first washing your hands.  Not getting a flu shot every year. You may have a higher risk for the flu, along with serious problems such as a lung infection (pneumonia), if you:  Are older than 65.  Are pregnant.  Have a weakened disease-fighting system (immune system) because of a disease or taking certain medicines.  Have a long-term (chronic) illness, such as: ? Heart, kidney, or lung disease. ? Diabetes. ? Asthma.  Have a liver disorder.  Are very overweight (morbidly obese).  Have anemia. This is a condition that affects your red blood cells. What are the signs or symptoms? Symptoms usually begin suddenly and last 4-14 days. They may include:  Fever and chills.  Headaches, body aches, or muscle aches.  Sore throat.  Cough.  Runny or stuffy (congested) nose.  Chest discomfort.  Not wanting to eat as  much as normal (poor appetite).  Weakness or feeling tired (fatigue).  Dizziness.  Feeling sick to your stomach (nauseous) or throwing up (vomiting). How is this treated? If the flu is found early, you can be treated with medicine that can help reduce how bad the illness is and how long it lasts (antiviral medicine). This may be given by mouth (orally) or through an IV tube. Taking care of yourself at home can help your symptoms get better. Your doctor may suggest:  Taking over-the-counter medicines.  Drinking plenty of fluids. The flu often goes away on its own. If you have very bad symptoms or other problems, you may be treated in a  hospital. Follow these instructions at home:     Activity  Rest as needed. Get plenty of sleep.  Stay home from work or school as told by your doctor. ? Do not leave home until you do not have a fever for 24 hours without taking medicine. ? Leave home only to visit your doctor. Eating and drinking  Take an ORS (oral rehydration solution). This is a drink that is sold at pharmacies and stores.  Drink enough fluid to keep your pee (urine) pale yellow.  Drink clear fluids in small amounts as you are able. Clear fluids include: ? Water. ? Ice chips. ? Fruit juice that has water added (diluted fruit juice). ? Low-calorie sports drinks.  Eat bland, easy-to-digest foods in small amounts as you are able. These foods include: ? Bananas. ? Applesauce. ? Rice. ? Lean meats. ? Toast. ? Crackers.  Do not eat or drink: ? Fluids that have a lot of sugar or caffeine. ? Alcohol. ? Spicy or fatty foods. General instructions  Take over-the-counter and prescription medicines only as told by your doctor.  Use a cool mist humidifier to add moisture to the air in your home. This can make it easier for you to breathe.  Cover your mouth and nose when you cough or sneeze.  Wash your hands with soap and water often, especially after you cough or sneeze. If you cannot use soap and water, use alcohol-based hand sanitizer.  Keep all follow-up visits as told by your doctor. This is important. How is this prevented?   Get a flu shot every year. You may get the flu shot in late summer, fall, or winter. Ask your doctor when you should get your flu shot.  Avoid contact with people who are sick during fall and winter (cold and flu season). Contact a doctor if:  You get new symptoms.  You have: ? Chest pain. ? Watery poop (diarrhea). ? A fever.  Your cough gets worse.  You start to have more mucus.  You feel sick to your stomach.  You throw up. Get help right away if you:  Have  shortness of breath.  Have trouble breathing.  Have skin or nails that turn a bluish color.  Have very bad pain or stiffness in your neck.  Get a sudden headache.  Get sudden pain in your face or ear.  Cannot eat or drink without throwing up. Summary  Influenza ("the flu") is an infection in the lungs, nose, and throat. It is caused by a virus.  Take over-the-counter and prescription medicines only as told by your doctor.  Getting a flu shot every year is the best way to avoid getting the flu. This information is not intended to replace advice given to you by your health care provider. Make sure you discuss any  questions you have with your health care provider. Document Released: 05/01/2008 Document Revised: 01/08/2018 Document Reviewed: 01/08/2018 Elsevier Interactive Patient Education  2019 [employer].

## 2018-10-17 NOTE — Progress Notes (Signed)
Patient ID: Kristen [name] DOB: [birth year] AGE: 48 y.o. MRN: [id number]   PCP: Doreene Nest, NP   Chief Complaint:  Chief Complaint  Patient presents with  . Cough    x2wk  . Fever    x1d  . Chills    x1d  . Generalized Body Aches    x1d     Subjective:    HPI:  Kristen [name] is a 48 y.o. female presents for evaluation  Chief Complaint  Patient presents with  . Cough    x2wk  . Fever    x1d  . Chills    x1d  . Generalized Body Aches    x60d    48 year old female presents to [facility] with two week history of cough. On and off. Primarily dry; rarely productive, when productive, scant brown sputum. Associated minimal nasal congestion and postnasal drip.  Patient seen by PCP's office, Olean Ree FNP with [facility], on 10/15/2018, two days ago, with several week history of cough. Patient reported associated chest tightness. Associated minimal nasal congesiton and PND.   Patient started Amlodipine-Benazepril qhs 10/19.   Minimal concern for ACE cough. Suspected viral and/or seasonal allergies. Advised Claritin and Mucinex-DM.  Patient states that evening, 10/15/2018, developed fever. Associated chills, sweats, and body aches. Associated headache, malaise, nausea, and worsening cough. Cough with chest congestion. Has been taking Mucinex as advised. Has taken OTC Advil, last dose this morning. Tmax 101F. Denies dizziness/lightheadedness, ear pain, sinus pain, sore throat, chest pain, SOB, wheezing, abdominal pain, vomiting, diarrhea, rash. Patient did receive this season's influenza vaccination. Patient's daughter with flu one month ago. Patient with seasonal allergies. Diagnosed with asthma few years ago. Never had daily/maintenance inhaler. No smoking history. No emphysema/COPD history.  Patient denies any recent travel, international or domestic. No known Covid-19 exposure.  A limited review of symptoms was performed,  pertinent positives and negatives as mentioned in HPI.  The following portions of the patient's history were reviewed and updated as appropriate: allergies, current medications and past medical history.  Patient Active Problem List   Diagnosis Date Noted  . Chronic right hip pain 05/30/2018  . Abnormal facial hair 05/30/2018  . Acute sinusitis 05/26/2018  . GAD (generalized anxiety disorder) 04/25/2018  . Chest pain 09/04/2017  . Hot flashes 05/28/2017  . Obesity (BMI 30-39.9) 09/03/2016  . Intracranial hypertension, benign 06/22/2016  . Visual disturbance 05/18/2016  . Other fatigue 03/01/2016  . Preventative health care 01/03/2016  . Insomnia 11/29/2015  . Essential hypertension 11/11/2015  . GERD (gastroesophageal reflux disease) 11/11/2015    Allergies  Allergen Reactions  . Amoxicillin Nausea Only    Has patient had a PCN reaction causing immediate rash, facial/tongue/throat swelling, SOB or lightheadedness with hypotension: No Has patient had a PCN reaction causing severe rash involving mucus membranes or skin necrosis: No Has patient had a PCN reaction that required hospitalization: No Has patient had a PCN reaction occurring within the last 10 years: Yes If all of the above answers are "NO", then may proceed with Cephalosporin use.   Marland Kitchen Doxycycline   . Penicillin G Rash    Has patient had a PCN reaction causing immediate rash, facial/tongue/throat swelling, SOB or lightheadedness with hypotension: Unknown Has patient had a PCN reaction causing severe rash involving mucus membranes or skin necrosis: Unknown Has patient had a PCN reaction that required hospitalization: Unknown Has patient had a PCN reaction occurring within the last 10 years: No If all  of the above answers are "NO", then may proceed with Cephalosporin use.     Current Outpatient Medications on File Prior to Visit  Medication Sig Dispense Refill  . amLODipine-benazepril (LOTREL) 10-20 MG capsule Take 1  capsule by mouth daily. 90 capsule 3  . DIVIGEL 0.5 MG/0.5GM GEL APPLY 1 PACKET TOPICALLY DAILY 30 each 6  . pantoprazole (PROTONIX) 40 MG tablet Take 1 tablet by mouth daily.    . progesterone (PROMETRIUM) 100 MG capsule Take 1 capsule (100 mg total) by mouth at bedtime. 90 capsule 1  . temazepam (RESTORIL) 15 MG capsule TAKE ONE CAPSULE BY MOUTH EACH NIGHT AT BEDTIME AS NEEDED FOR SLEEP 30 capsule 0   No current facility-administered medications on file prior to visit.        Objective:   Vitals:   10/17/18 0830  BP: (!) 130/100  Pulse: 83  Resp: 20  Temp: 98.1 F (36.7 C)  SpO2: 96%     Wt Readings from Last 3 Encounters:  10/17/18 216 lb (98 kg)  10/15/18 215 lb (97.5 kg)  05/30/18 216 lb 12 oz (98.3 kg)    Physical Exam:   General Appearance:  Patient sitting comfortably on examination table. Conversational. Peri Jefferson self-historian. In no acute distress. Afebrile.   Head:  Normocephalic, without obvious abnormality, atraumatic  Eyes:  PERRL, conjunctiva/corneas clear, EOM's intact  Ears:  Left ear canal WNL. No erythema or edema. No open wound. No visible purulent drainage. No tenderness with palpation over left tragus or with manipulation of left auricle. No visible erythema or edema of left mastoid. No tenderness with palpation over left mastoid. Right ear canal WNL. No erythema or edema. No open wound. No visible purulent drainage. No tenderness with palpation over right tragus or with manipulation of right auricle. No visible erythema or edema of right mastoid. No tenderness with palpation over right mastoid. Left TM WNL. Good light reflex. Visible landmarks. No erythema. No injection. No bulging or retraction. No visible perforation. No serous effusion. No visible purulent effusion. No tympanostomy tube. No scar tissue. Right TM WNL. Good light reflex. Visible landmarks. No erythema. No injection. No bulging or retraction. No visible perforation. No serous effusion. No  visible purulent effusion. No tympanostomy tube. No scar tissue.  Nose: Nares normal. Septum midline. No visible polyps. Nasal mucosa with bilateral edema and bogginess. Scant clear rhinorrhea. No sinus tenderness with percussion/palpation.  Throat: Lips, mucosa, and tongue normal; teeth and gums normal. Throat reveals no erythema. No postnasal drip. No visible cobblestoning. Tonsils with no enlargement or exudate. Uvula midline with no edema or erythema.  Neck: Supple, symmetrical, trachea midline, no palpable lymphadenopathy  Lungs:   Clear to auscultation bilaterally, respirations unlabored. Good aeration. No rales, rhonchi, crackles or wheezing. Dry harsh cough elicited with deep inspiration and forced expiration.  Heart:  Regular rate and rhythm, S1 and S2 normal, no murmur, rub, or gallop  Extremities: Extremities normal, atraumatic, no cyanosis or edema  Pulses: 2+ and symmetric  Skin: Skin color, texture, turgor normal, no rashes or lesions  Lymph nodes: Cervical, supraclavicular, and axillary nodes normal  Neurologic: Normal    Assessment & Plan:    Exam findings, diagnosis etiology and medication use and indications reviewed with patient. Follow-Up and discharge instructions provided. No emergent/urgent issues found on exam.  Patient education was provided.   Patient verbalized understanding of information provided and agrees with plan of care (POC), all questions answered. The patient is advised to call or return to  clinic if condition does not see an improvement in symptoms, or to seek the care of the closest emergency department if condition worsens with the below plan.    1. Influenza A - oseltamivir (TAMIFLU) 75 MG capsule; Take 1 capsule (75 mg total) by mouth 2 (two) times daily for 5 days.  Dispense: 10 capsule; Refill: 0 - promethazine-dextromethorphan (PROMETHAZINE-DM) 6.25-15 MG/5ML syrup; Take 5 mLs by mouth 4 (four) times daily as needed for cough.  Dispense: 118 mL;  Refill: 0 - albuterol (PROVENTIL HFA;VENTOLIN HFA) 108 (90 Base) MCG/ACT inhaler; Inhale 2 puffs into the lungs every 4 (four) hours as needed.  Dispense: 1 Inhaler; Refill: 0  2. Flu-like symptoms - POCT Influenza A/B   Patient with 2 week history of cough. New onset two days of fever, chills, sweats, body aches, and worsening cough. Suspect new infection of influenza. Positive rapid flu test (strain A). Patient with 48hour antiviral window; prescribed Tamiflu. VSS, afebrile (recent antipyretic), in no acute distress, clear lung sounds. Prescribed Phenergan-DM and albuterol inhaler for cough. Discussed contagiousness. Provided work excuse note. Advised patient follow-up with PCP or urgent care in 4-5 days if symptoms not improving. Sooner with any worsening symptoms. Patient agrees with plan.   Janalyn Harder, MHS, PA-C Rulon Sera, MHS, PA-C Advanced Practice Provider [facility]  [address] [facility], 1st Floor [place], [place] [address] (p):  [phone] [email or web address]

## 2018-10-20 ENCOUNTER — Telehealth: Payer: Self-pay | Admitting: Emergency Medicine

## 2018-10-20 NOTE — Telephone Encounter (Signed)
Attempted to contact patient unable to. Mailbox full This was a follow up visit with [facility].

## 2018-11-05 ENCOUNTER — Telehealth: Payer: Self-pay | Admitting: Primary Care

## 2018-11-05 NOTE — Telephone Encounter (Signed)
fmla paperwork  In Kristen [name]'s  In box  Pt stated she needed this faxed today

## 2018-11-05 NOTE — Telephone Encounter (Signed)
Completed and handed to Robin. °

## 2018-11-06 NOTE — Telephone Encounter (Signed)
Paperwork faxed °

## 2018-11-11 NOTE — Telephone Encounter (Signed)
Copy for pt °Copy for scan °Pt aware ° °

## 2018-11-26 ENCOUNTER — Telehealth: Payer: Self-pay | Admitting: *Deleted

## 2018-11-26 NOTE — Progress Notes (Signed)
Virtual Visit via Video Note   This visit type was conducted due to national recommendations for restrictions regarding the COVID-19 Pandemic (e.g. social distancing) in an effort to limit this patient's exposure and mitigate transmission in our community.  Due to her co-morbid illnesses, this patient is at least at moderate risk for complications without adequate follow up.  This format is felt to be most appropriate for this patient at this time.  All issues noted in this document were discussed and addressed.  A limited physical exam was performed with this format.  Please refer to the patient's chart for her consent to telehealth for [facility].   Evaluation Performed:  Follow-up visit  Date:  12/01/2018   ID:  Kristen [name], DOB 04-Jul-1971, MRN [id number]  Patient Location: Home Provider Location: Home  PCP:  Doreene Nest, NP  Cardiologist:  Dr Jens Som  Chief Complaint: Follow-up chest pain  History of Present Illness:    Patient seen with chest pain February 2019. Echo March 2019 showed normal LV function, moderate diastolic dysfunction.  Exercise treadmill March 2019 no ischemia.  CTA of neck August 2019 negative.  Since last seen the patient has dyspnea with more extreme activities but not with routine activities. It is relieved with rest. It is not associated with chest pain. There is no orthopnea, PND or pedal edema. There is no syncope or palpitations. There is no exertional chest pain.   The patient does not have symptoms concerning for COVID-19 infection (fever, chills, cough, or new shortness of breath).    Past Medical History:  Diagnosis Date  . Abnormal Pap smear of cervix    dysplasia   . Anemia    history of  . Anxiety   . Asthma    seasonal asthma, has not required use of inhaler in over 2 years  . Cervical dysplasia 1993  . Depression   . Diverticulitis   . Dysmenorrhea   . Essential hypertension   . GERD (gastroesophageal reflux disease)    . Headache    history of migraines  . IBS (irritable bowel syndrome)   . STD (sexually transmitted disease) ~1998   chlamydia    Past Surgical History:  Procedure Laterality Date  . ADENOIDECTOMY     lymph nodes removed  . APPENDECTOMY  1990  . COLONOSCOPY    . COLPOSCOPY  1992  . CYSTOSCOPY N/A 02/12/2017   Procedure: CYSTOSCOPY;  Surgeon: Jerene Bears, MD;  Location: [facility];  Service: Gynecology;  Laterality: N/A;  . TONSILLECTOMY    . TOTAL LAPAROSCOPIC HYSTERECTOMY WITH SALPINGECTOMY Bilateral 02/12/2017   Procedure: HYSTERECTOMY TOTAL LAPAROSCOPIC WITH SALPINGECTOMY;  Surgeon: Jerene Bears, MD;  Location: [facility];  Service: Gynecology;  Laterality: Bilateral;  . TUBAL LIGATION       Current Meds  Medication Sig  . albuterol (PROVENTIL HFA;VENTOLIN HFA) 108 (90 Base) MCG/ACT inhaler Inhale 2 puffs into the lungs every 4 (four) hours as needed.  Marland Kitchen amLODipine-benazepril (LOTREL) 10-20 MG capsule Take 1 capsule by mouth daily.  Marland Kitchen DIVIGEL 0.5 MG/0.5GM GEL APPLY 1 PACKET TOPICALLY DAILY  . pantoprazole (PROTONIX) 40 MG tablet Take 1 tablet by mouth daily.  . progesterone (PROMETRIUM) 100 MG capsule Take 1 capsule (100 mg total) by mouth at bedtime.  . promethazine-dextromethorphan (PROMETHAZINE-DM) 6.25-15 MG/5ML syrup Take 5 mLs by mouth 4 (four) times daily as needed for cough.  . temazepam (RESTORIL) 15 MG capsule TAKE ONE CAPSULE BY MOUTH EACH NIGHT AT BEDTIME AS NEEDED  FOR SLEEP     Allergies:   Amoxicillin; Doxycycline; and Penicillin g   Social History   Tobacco Use  . Smoking status: Never Smoker  . Smokeless tobacco: Never Used  Substance Use Topics  . Alcohol use: No    Alcohol/week: 0.0 standard drinks  . Drug use: No     Family Hx: The patient's family history includes Colon cancer (age of onset: 5351) in her father; Depression in her mother; Diabetes in her brother and maternal grandmother; Hypertension in her maternal grandmother and mother. There is no  history of Stomach cancer.  ROS:   Please see the history of present illness.    No fevers, chills or productive cough. All other systems reviewed and are negative.   Recent Labs: 04/03/2018: Hemoglobin 13.6; Platelets 359 05/29/2018: ALT 17; BUN 14; Creatinine, Ser 1.11; Magnesium 2.1; Potassium 3.7; Sodium 138; TSH 2.74   Recent Lipid Panel Lab Results  Component Value Date/Time   CHOL 126 05/29/2018 10:02 AM   TRIG 61.0 05/29/2018 10:02 AM   HDL 40.10 05/29/2018 10:02 AM   CHOLHDL 3 05/29/2018 10:02 AM   LDLCALC 74 05/29/2018 10:02 AM    Wt Readings from Last 3 Encounters:  12/01/18 215 lb (97.5 kg)  10/17/18 216 lb (98 kg)  10/15/18 215 lb (97.5 kg)     Objective:    Vital Signs:  BP 120/88   Pulse 92   Ht 5' 1.5" (1.562 m)   Wt 215 lb (97.5 kg)   LMP 01/27/2017 (Exact Date)   SpO2 98%   BMI 39.97 kg/m    VITAL SIGNS:  reviewed  Patient answers questions appropriately. Normal affect. Remainder of physical exam not performed (telehealth visit; coronavirus pandemic)  ASSESSMENT & PLAN:    1. Chest pain-previous symptoms atypical.  No recurrence. Exercise treadmill negative.  Echocardiogram showed normal LV function.  No plans for further ischemia evaluation. 2. Hypertension-patient's blood pressure is controlled.  However she complains of cough.  Discontinue Lotrel.  Instead we will treat with amlodipine 10 mg daily and add Avapro 150 mg daily.  Follow blood pressure and advance regimen as needed. 3. Diastolic dysfunction-we discussed the importance of blood pressure control.  We also discussed low-sodium diet. 4. Obesity-we discussed the importance of continuing efforts at diet, exercise and weight loss.  COVID-19 Education: The importance of social distancing was discussed today.  Time:   Today, I have spent 15 minutes with the patient with telehealth technology discussing the above problems.     Medication Adjustments/Labs and Tests Ordered: Current  medicines are reviewed at length with the patient today.  Concerns regarding medicines are outlined above.   Tests Ordered: No orders of the defined types were placed in this encounter.   Medication Changes: No orders of the defined types were placed in this encounter.   Disposition:  Follow up in 6 month(s)  Signed, Olga MillersBrian Crenshaw, MD  12/01/2018 4:08 PM    [facility]

## 2018-11-26 NOTE — Telephone Encounter (Signed)
11/26/18  Unable to leave a message @ 11:51 am, mail box is full.

## 2018-11-28 ENCOUNTER — Telehealth: Payer: Self-pay

## 2018-11-28 NOTE — Telephone Encounter (Signed)
Left a voice message for the patient to call our office back to go over some things for her upcoming virtual visit with Dr. Jens Som.

## 2018-12-01 ENCOUNTER — Telehealth (INDEPENDENT_AMBULATORY_CARE_PROVIDER_SITE_OTHER): Payer: No Typology Code available for payment source | Admitting: Cardiology

## 2018-12-01 ENCOUNTER — Encounter: Payer: Self-pay | Admitting: *Deleted

## 2018-12-01 VITALS — BP 120/88 | HR 92 | Ht 61.5 in | Wt 215.0 lb

## 2018-12-01 DIAGNOSIS — R072 Precordial pain: Secondary | ICD-10-CM

## 2018-12-01 DIAGNOSIS — I10 Essential (primary) hypertension: Secondary | ICD-10-CM | POA: Diagnosis not present

## 2018-12-01 DIAGNOSIS — I5189 Other ill-defined heart diseases: Secondary | ICD-10-CM

## 2018-12-01 MED ORDER — IRBESARTAN 150 MG PO TABS: 150.0000 mg | ORAL_TABLET | Freq: Every day | ORAL | 3 refills | Status: DC

## 2018-12-01 MED ORDER — AMLODIPINE BESYLATE 10 MG PO TABS: 10.0000 mg | ORAL_TABLET | Freq: Every day | ORAL | 3 refills | Status: DC

## 2018-12-01 NOTE — Patient Instructions (Signed)
Medication Instructions:  STOP LOTREL  START AMLODIPINE 10 MG ONCE DAILY  START IRBESARTAN 150 MG ONCE DAILY If you need a refill on your cardiac medications before your next appointment, please call your pharmacy.   Lab work: If you have labs (blood work) drawn today and your tests are completely normal, you will receive your results only by: Marland Kitchen MyChart Message (if you have MyChart) OR . A paper copy in the mail If you have any lab test that is abnormal or we need to change your treatment, we will call you to review the results.  Follow-Up: At [facility], you and your health needs are our priority.  As part of our continuing mission to provide you with exceptional heart care, we have created designated Provider Care Teams.  These Care Teams include your primary Cardiologist (physician) and Advanced Practice Providers (APPs -  Physician Assistants and Nurse Practitioners) who all work together to provide you with the care you need, when you need it. You will need a follow up appointment in 6 months.  Please call our office 2 months in advance to schedule this appointment.  You may see Olga Millers MD or one of the following Advanced Practice Providers on your designated Care Team:   Corine Shelter, PA-C Judy Pimple, [place] . Marjie Skiff, PA-C

## 2018-12-22 ENCOUNTER — Encounter

## 2018-12-30 ENCOUNTER — Other Ambulatory Visit: Payer: Self-pay | Admitting: Primary Care

## 2018-12-30 DIAGNOSIS — K219 Gastro-esophageal reflux disease without esophagitis: Secondary | ICD-10-CM

## 2018-12-30 NOTE — Telephone Encounter (Signed)
Last prescribed on 05/13/2017. Last office visit on 10/15/2018. No future appointment

## 2018-12-31 MED ORDER — PANTOPRAZOLE SODIUM 40 MG PO TBEC: 40.0000 mg | DELAYED_RELEASE_TABLET | Freq: Every day | ORAL | 1 refills | Status: DC

## 2018-12-31 NOTE — Telephone Encounter (Signed)
Noted, spoke with patient who endorses improvement on the 40 mg dose rather than 20 mg.  Refills provided for 40 mg.

## 2019-01-12 ENCOUNTER — Ambulatory Visit
Admission: RE | Admit: 2019-01-12 | Discharge: 2019-01-12 | Disposition: A | Discharge status: Home / Self Care | Payer: No Typology Code available for payment source | Source: Ambulatory Visit | Attending: Primary Care | Admitting: Primary Care

## 2019-01-12 ENCOUNTER — Other Ambulatory Visit: Payer: Self-pay

## 2019-01-12 DIAGNOSIS — Z1239 Encounter for other screening for malignant neoplasm of breast: Secondary | ICD-10-CM

## 2019-01-12 DIAGNOSIS — Z1231 Encounter for screening mammogram for malignant neoplasm of breast: Secondary | ICD-10-CM | POA: Diagnosis not present

## 2019-03-04 ENCOUNTER — Other Ambulatory Visit: Payer: Self-pay

## 2019-03-06 ENCOUNTER — Encounter: Payer: Self-pay | Admitting: Obstetrics & Gynecology

## 2019-03-06 ENCOUNTER — Other Ambulatory Visit: Payer: Self-pay

## 2019-03-06 ENCOUNTER — Ambulatory Visit (INDEPENDENT_AMBULATORY_CARE_PROVIDER_SITE_OTHER): Payer: No Typology Code available for payment source | Admitting: Obstetrics & Gynecology

## 2019-03-06 VITALS — BP 138/90 | HR 76 | Temp 97.2°F | Ht 61.5 in | Wt 219.0 lb

## 2019-03-06 DIAGNOSIS — Z01419 Encounter for gynecological examination (general) (routine) without abnormal findings: Secondary | ICD-10-CM | POA: Diagnosis not present

## 2019-03-06 DIAGNOSIS — Z0000 Encounter for general adult medical examination without abnormal findings: Secondary | ICD-10-CM | POA: Diagnosis not present

## 2019-03-06 DIAGNOSIS — R319 Hematuria, unspecified: Secondary | ICD-10-CM

## 2019-03-06 DIAGNOSIS — N898 Other specified noninflammatory disorders of vagina: Secondary | ICD-10-CM

## 2019-03-06 MED ORDER — DIVIGEL 0.5 MG/0.5GM TD GEL: TRANSDERMAL | 4 refills | Status: DC

## 2019-03-06 MED ORDER — PROGESTERONE MICRONIZED 100 MG PO CAPS: 100.0000 mg | ORAL_CAPSULE | Freq: Every day | ORAL | 4 refills | Status: DC

## 2019-03-06 MED ORDER — TEMAZEPAM 15 MG PO CAPS: ORAL_CAPSULE | ORAL | 1 refills | Status: DC

## 2019-03-06 NOTE — Addendum Note (Signed)
Addended by: Polly Cobia on: 03/06/2019 02:57 PM   Modules accepted: Orders

## 2019-03-06 NOTE — Patient Instructions (Signed)
[facility]   [address] [place], [place] [address]  [phone]  

## 2019-03-06 NOTE — Progress Notes (Signed)
48 y.o. [id number] Married [place] American female here for annual exam.  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  This has been a great change for her.  She will then work fri-sun option.  Denies vaginal bleeding.    Doing well from hormonal standpoint.   Frustrate with weight.  Weight Management information given.    Patient's last menstrual period was 01/27/2017 (exact date).          Sexually active: No.  The current method of family planning is status post hysterectomy.    Exercising: No.   Smoker:  no  Health Maintenance: Pap:  01/08/17 neg. HR HPV:neg History of abnormal Pap:  Yes, remote hx of dysplasia  MMG:  01/12/19 BIRADS1:neg Colonoscopy:  02/08/17 f/u 5 years BMD:   Never TDaP:  2016 Screening Labs: Here today - not fasting    reports that she has never smoked. She has never used smokeless tobacco. She reports that she does not drink alcohol or use drugs.  Past Medical History:  Diagnosis Date  . Abnormal Pap smear of cervix    dysplasia   . Anemia    history of  . Anxiety   . Asthma    seasonal asthma, has not required use of inhaler in over 2 years  . Cervical dysplasia 1993  . Depression   . Diverticulitis   . Dysmenorrhea   . Essential hypertension   . GERD (gastroesophageal reflux disease)   . Headache    history of migraines  . IBS (irritable bowel syndrome)   . STD (sexually transmitted disease) ~1998   chlamydia     Past Surgical History:  Procedure Laterality Date  . ABDOMINAL HYSTERECTOMY    . ADENOIDECTOMY     lymph nodes removed  . APPENDECTOMY  1990  . COLONOSCOPY    . COLPOSCOPY  1992  . CYSTOSCOPY N/A 02/12/2017   Procedure: CYSTOSCOPY;  Surgeon: Megan Salon, MD;  Location: [facility];  Service: Gynecology;  Laterality: N/A;  . TONSILLECTOMY    . TOTAL LAPAROSCOPIC HYSTERECTOMY WITH SALPINGECTOMY Bilateral 02/12/2017   Procedure: HYSTERECTOMY TOTAL LAPAROSCOPIC WITH SALPINGECTOMY;   Surgeon: Megan Salon, MD;  Location: [facility];  Service: Gynecology;  Laterality: Bilateral;  . TUBAL LIGATION      Current Outpatient Medications  Medication Sig Dispense Refill  . albuterol (PROVENTIL HFA;VENTOLIN HFA) 108 (90 Base) MCG/ACT inhaler Inhale 2 puffs into the lungs every 4 (four) hours as needed. 1 Inhaler 0  . amLODipine (NORVASC) 10 MG tablet Take 1 tablet (10 mg total) by mouth daily. 90 tablet 3  . DIVIGEL 0.5 MG/0.5GM GEL APPLY 1 PACKET TOPICALLY DAILY 30 each 6  . irbesartan (AVAPRO) 150 MG tablet Take 1 tablet (150 mg total) by mouth daily. 90 tablet 3  . pantoprazole (PROTONIX) 40 MG tablet Take 1 tablet (40 mg total) by mouth daily. 90 tablet 1  . progesterone (PROMETRIUM) 100 MG capsule Take 1 capsule (100 mg total) by mouth at bedtime. 90 capsule 1  . temazepam (RESTORIL) 15 MG capsule TAKE ONE CAPSULE BY MOUTH EACH NIGHT AT BEDTIME AS NEEDED FOR SLEEP 30 capsule 0   No current facility-administered medications for this visit.     Family History  Problem Relation Age of Onset  . Hypertension Mother   . Depression Mother   . Diabetes Maternal Grandmother   . Hypertension Maternal Grandmother   . Colon cancer Father 31  . Diabetes  Brother   . Stomach cancer Neg Hx   . Breast cancer Neg Hx     Review of Systems  All other systems reviewed and are negative.   Exam:   BP 138/90   Pulse 76   Temp (!) 97.2 F (36.2 C) (Temporal)   Ht 5' 1.5" (1.562 m)   Wt 219 lb (99.3 kg)   LMP 01/27/2017 (Exact Date)   BMI 40.71 kg/m    Height: 5' 1.5" (156.2 cm)  Ht Readings from Last 3 Encounters:  03/06/19 5' 1.5" (1.562 m)  12/01/18 5' 1.5" (1.562 m)  10/15/18 5' 1.5" (1.562 m)    General appearance: alert, cooperative and appears stated age Head: Normocephalic, without obvious abnormality, atraumatic Neck: no adenopathy, supple, symmetrical, trachea midline and thyroid normal to inspection and palpation Lungs: clear to auscultation bilaterally Breasts:  normal appearance, no masses or tenderness Heart: regular rate and rhythm Abdomen: soft, non-tender; bowel sounds normal; no masses,  no organomegaly Extremities: extremities normal, atraumatic, no cyanosis or edema Skin: Skin color, texture, turgor normal. No rashes or lesions Lymph nodes: Cervical, supraclavicular, and axillary nodes normal. No abnormal inguinal nodes palpated Neurologic: Grossly normal   Pelvic: External genitalia:  no lesions              Urethra:  normal appearing urethra with no masses, tenderness or lesions              Bartholins and Skenes: normal                 Vagina: normal appearing vagina with normal color and discharge, no lesions              Cervix: absent              Pap taken: No. Bimanual Exam:  Uterus:  uterus absent              Adnexa: no mass, fullness, tenderness               Rectovaginal: Confirms               Anus:  normal sphincter tone, no lesions  Chaperone was present for exam.  A:  Well Woman with normal exam S/p TLH/bilateral salpingectomy/cystoscopy SUI Hypertension Weight gain, obesity Family hx of colon cancer  P:   Mammogram guidelines reviewed. UTD. pap smear not indicated Colonoscopy UTD. CBC, CMP, Lipids, TSH and Vit D RF for Divigel and Prometrium RF for Ambien 15mg  qhs prn.  #30/1RF.  Pt rarely uses this. Asked pharmacy to file rx for pt. Information about weight management Referral for possible sling discussed.  She is going to work on weight loss first. Return annually or prn

## 2019-03-07 LAB — TSH: TSH: 3.73 u[IU]/mL (ref 0.450–4.500)

## 2019-03-07 LAB — COMPREHENSIVE METABOLIC PANEL
ALT: 13 IU/L (ref 0–32)
AST: 12 IU/L (ref 0–40)
Albumin/Globulin Ratio: 1.4 (ref 1.2–2.2)
Albumin: 4.2 g/dL (ref 3.8–4.8)
Alkaline Phosphatase: 45 IU/L (ref 39–117)
BUN/Creatinine Ratio: 10 (ref 9–23)
BUN: 9 mg/dL (ref 6–24)
Bilirubin Total: <0.2 mg/dL (ref 0.0–1.2)
CO2: 22 mmol/L (ref 20–29)
Calcium: 9.1 mg/dL (ref 8.7–10.2)
Chloride: 105 mmol/L (ref 96–106)
Creatinine, Ser: 0.89 mg/dL (ref 0.57–1.00)
GFR calc Af Amer: 89 mL/min/{1.73_m2} (ref >59)
GFR calc non Af Amer: 77 mL/min/{1.73_m2} (ref >59)
Globulin, Total: 3.1 g/dL (ref 1.5–4.5)
Glucose: 82 mg/dL (ref 65–99)
Potassium: 4.4 mmol/L (ref 3.5–5.2)
Sodium: 142 mmol/L (ref 134–144)
Total Protein: 7.3 g/dL (ref 6.0–8.5)

## 2019-03-07 LAB — LIPID PANEL
Chol/HDL Ratio: 2.7 ratio (ref 0.0–4.4)
Cholesterol, Total: 128 mg/dL (ref 100–199)
HDL: 47 mg/dL (ref >39)
LDL Calculated: 68 mg/dL (ref 0–99)
Triglycerides: 66 mg/dL (ref 0–149)
VLDL Cholesterol Cal: 13 mg/dL (ref 5–40)

## 2019-03-07 LAB — URINALYSIS, MICROSCOPIC ONLY

## 2019-03-07 LAB — CBC
Hematocrit: 41.3 % (ref 34.0–46.6)
Hemoglobin: 13.3 g/dL (ref 11.1–15.9)
MCH: 31.6 pg (ref 26.6–33.0)
MCHC: 32.2 g/dL (ref 31.5–35.7)
MCV: 98 fL — ABNORMAL HIGH (ref 79–97)
Platelets: 360 10*3/uL (ref 150–450)
RBC: 4.21 x10E6/uL (ref 3.77–5.28)
RDW: 12.1 % (ref 11.7–15.4)
WBC: 5.8 10*3/uL (ref 3.4–10.8)

## 2019-03-07 LAB — VAGINITIS/VAGINOSIS, DNA PROBE
Candida Species: NEGATIVE
Gardnerella vaginalis: NEGATIVE
Trichomonas vaginosis: NEGATIVE

## 2019-03-07 LAB — VITAMIN D 25 HYDROXY (VIT D DEFICIENCY, FRACTURES): Vit D, 25-Hydroxy: 25.6 ng/mL — ABNORMAL LOW (ref 30.0–100.0)

## 2019-12-01 ENCOUNTER — Other Ambulatory Visit (HOSPITAL_COMMUNITY)
Admission: RE | Admit: 2019-12-01 | Discharge: 2019-12-01 | Disposition: A | Discharge status: Home / Self Care | Payer: No Typology Code available for payment source | Source: Ambulatory Visit | Attending: Obstetrics and Gynecology | Admitting: Obstetrics and Gynecology

## 2019-12-01 ENCOUNTER — Encounter: Payer: Self-pay | Admitting: Obstetrics and Gynecology

## 2019-12-01 ENCOUNTER — Other Ambulatory Visit: Payer: Self-pay

## 2019-12-01 ENCOUNTER — Ambulatory Visit (INDEPENDENT_AMBULATORY_CARE_PROVIDER_SITE_OTHER): Payer: No Typology Code available for payment source | Admitting: Obstetrics and Gynecology

## 2019-12-01 ENCOUNTER — Telehealth: Payer: Self-pay | Admitting: Obstetrics & Gynecology

## 2019-12-01 VITALS — BP 140/82 | HR 60 | Temp 97.3°F | Ht 61.5 in | Wt 210.0 lb

## 2019-12-01 DIAGNOSIS — Z113 Encounter for screening for infections with a predominantly sexual mode of transmission: Secondary | ICD-10-CM | POA: Diagnosis not present

## 2019-12-01 DIAGNOSIS — R300 Dysuria: Secondary | ICD-10-CM

## 2019-12-01 DIAGNOSIS — N760 Acute vaginitis: Secondary | ICD-10-CM

## 2019-12-01 LAB — POCT URINALYSIS DIPSTICK
Bilirubin, UA: NEGATIVE
Blood, UA: NEGATIVE
Glucose, UA: NEGATIVE
Ketones, UA: NEGATIVE
Leukocytes, UA: NEGATIVE
Nitrite, UA: NEGATIVE
Protein, UA: NEGATIVE
Urobilinogen, UA: 0.2 E.U./dL
pH, UA: 7 (ref 5.0–8.0)

## 2019-12-01 MED ORDER — NYSTATIN-TRIAMCINOLONE 100000-0.1 UNIT/GM-% EX CREA: 1.0000 "application " | TOPICAL_CREAM | Freq: Two times a day (BID) | CUTANEOUS | 0 refills | Status: DC

## 2019-12-01 NOTE — Telephone Encounter (Signed)
Patient is asking for an antibiotic for bacterial vaginosis. 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 She declined an appointment today. [facility] health care employee pharmacy.

## 2019-12-01 NOTE — Telephone Encounter (Signed)
AEX 03/06/19 with SM  Hysterectomy 2018 No Hx of BV noted in Epic. + yeast in 2018, 2019.  Spoke with pt. Pt reports having vaginal itching, discharge and odor with vaginal irritation x 4 days now. Pt states would like Rx called in for sx. Pt states has Hx of BV. Pt states has used baking soda bath and not douching and  BC sx not resolved. Denies using any other OTC treatments.  Denies fever, chills and back pain or any UTI sx.   Pt advised to be seen for further evaluation. Pt agreeable. Pt scheduled as work-in pt at 4:30 pm today with Dr Oscar La while Dr Hyacinth Meeker out of office. Pt verbalized understanding of date and time.   Routing to Dr Oscar La for review.  Encounter closed.

## 2019-12-01 NOTE — Progress Notes (Signed)
GYNECOLOGY  VISIT   HPI: 49 y.o.   Married  [place] American  female   [phone] with Patient's last menstrual period was 01/27/2017 (exact date).   here for vaginal odor--she thinks she probably has BV. Patient has only 1 partner but would like STD testing.  She is having a painful vulva and vagina.   Patient also complaining of dysuria and requesting a urine be checked.  Urine Dip: Neg  GYNECOLOGIC HISTORY: Patient's last menstrual period was 01/27/2017 (exact date). Contraception:  Hyst Menopausal hormone therapy: Prometrium and Estradiol patch Last mammogram: 01/12/19 BIRADS1:neg Last pap smear:  01/08/17 neg. HR HPV:neg        OB History    Gravida  4   Para  3   Term  3   Preterm  0   AB  1   Living  3     SAB  0   TAB  1   Ectopic  0   Multiple  0   Live Births  3              Patient Active Problem List   Diagnosis Date Noted  . Chronic right hip pain 05/30/2018  . Abnormal facial hair 05/30/2018  . Acute sinusitis 05/26/2018  . GAD (generalized anxiety disorder) 04/25/2018  . Chest pain 09/04/2017  . Hot flashes 05/28/2017  . Obesity (BMI 30-39.9) 09/03/2016  . Intracranial hypertension, benign 06/22/2016  . Visual disturbance 05/18/2016  . Other fatigue 03/01/2016  . Preventative health care 01/03/2016  . Insomnia 11/29/2015  . Essential hypertension 11/11/2015  . GERD (gastroesophageal reflux disease) 11/11/2015    Past Medical History:  Diagnosis Date  . Abnormal Pap smear of cervix    dysplasia   . Anemia    history of  . Anxiety   . Asthma    seasonal asthma, has not required use of inhaler in over 2 years  . Cervical dysplasia 1993  . Depression   . Diverticulitis   . Dysmenorrhea   . Essential hypertension   . GERD (gastroesophageal reflux disease)   . Headache    history of migraines  . IBS (irritable bowel syndrome)   . STD (sexually transmitted disease) ~1998   chlamydia     Past Surgical History:  Procedure  Laterality Date  . ABDOMINAL HYSTERECTOMY    . ADENOIDECTOMY     lymph nodes removed  . APPENDECTOMY  1990  . COLONOSCOPY    . COLPOSCOPY  1992  . CYSTOSCOPY N/A 02/12/2017   Procedure: CYSTOSCOPY;  Surgeon: Megan Salon, MD;  Location: [facility];  Service: Gynecology;  Laterality: N/A;  . TONSILLECTOMY    . TOTAL LAPAROSCOPIC HYSTERECTOMY WITH SALPINGECTOMY Bilateral 02/12/2017   Procedure: HYSTERECTOMY TOTAL LAPAROSCOPIC WITH SALPINGECTOMY;  Surgeon: Megan Salon, MD;  Location: [facility];  Service: Gynecology;  Laterality: Bilateral;  . TUBAL LIGATION      Current Outpatient Medications  Medication Sig Dispense Refill  . albuterol (PROVENTIL HFA;VENTOLIN HFA) 108 (90 Base) MCG/ACT inhaler Inhale 2 puffs into the lungs every 4 (four) hours as needed. 1 Inhaler 0  . Estradiol (DIVIGEL) 0.5 MG/0.5GM GEL APPLY 1 PACKET TOPICALLY DAILY 90 each 4  . pantoprazole (PROTONIX) 40 MG tablet Take 1 tablet (40 mg total) by mouth daily. 90 tablet 1  . temazepam (RESTORIL) 15 MG capsule TAKE ONE CAPSULE BY MOUTH EACH NIGHT AT BEDTIME AS NEEDED FOR SLEEP 30 capsule 1  . amLODipine (NORVASC) 10 MG tablet Take 1 tablet (10  mg total) by mouth daily. 90 tablet 3  . progesterone (PROMETRIUM) 100 MG capsule Take 1 capsule (100 mg total) by mouth at bedtime. (Patient not taking: Reported on 12/01/2019) 90 capsule 4   No current facility-administered medications for this visit.     ALLERGIES: Amoxicillin, Doxycycline, and Penicillin g  Family History  Problem Relation Age of Onset  . Hypertension Mother   . Depression Mother   . Diabetes Maternal Grandmother   . Hypertension Maternal Grandmother   . Colon cancer Father 21  . Diabetes Brother   . Stomach cancer Neg Hx   . Breast cancer Neg Hx     Social History   Socioeconomic History  . Marital status: Married    Spouse name: Not on file  . Number of children: 3  . Years of education: 51  . Highest education level: Not on file  Occupational  History    Comment: [employer], Health Coach  Tobacco Use  . Smoking status: Never Smoker  . Smokeless tobacco: Never Used  Substance and Sexual Activity  . Alcohol use: No  . Drug use: No  . Sexual activity: Not Currently    Partners: Male    Birth control/protection: Surgical  Other Topics Concern  . Not on file  Social History Narrative   Separated.    3 children.    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    Social Determinants of Health   Financial Resource Strain:   . Difficulty of Paying Living Expenses:   Food Insecurity:   . Worried About Programme researcher, broadcasting/film/video in the Last Year:   . Barista in the Last Year:   Transportation Needs:   . Freight forwarder (Medical):   Marland Kitchen Lack of Transportation (Non-Medical):   Physical Activity:   . Days of Exercise per Week:   . Minutes of Exercise per Session:   Stress:   . Feeling of Stress :   Social Connections:   . Frequency of Communication with Friends and Family:   . Frequency of Social Gatherings with Friends and Family:   . Attends Religious Services:   . Active Member of Clubs or Organizations:   . Attends Banker Meetings:   Marland Kitchen Marital Status:   Intimate Partner Violence:   . Fear of Current or Ex-Partner:   . Emotionally Abused:   Marland Kitchen Physically Abused:   . Sexually Abused:     Review of Systems  Constitutional:       Vaginal odor  Genitourinary: Positive for dysuria.  All other systems reviewed and are negative.   PHYSICAL EXAMINATION:    BP 140/82 (Cuff Size: Large)   Pulse 60   Temp (!) 97.3 F (36.3 C) (Temporal)   Ht 5' 1.5" (1.562 m)   Wt 210 lb (95.3 kg)   LMP 01/27/2017 (Exact Date)   BMI 39.04 kg/m     General appearance: alert, cooperative and appears stated age   Pelvic: External genitalia:  no lesions              Urethra:  normal appearing urethra with no masses, tenderness or lesions              Bartholins  and Skenes: normal                 Vagina: normal appearing vagina with normal color and discharge, no lesions  Cervix: absent                Bimanual Exam:  Uterus:  absent              Adnexa: no mass, fullness, tenderness              Chaperone was present for exam.  ASSESSMENT  Vulvovaginitis.  STD screening. Dysuria.    PLAN  Testing for BV, trich, yeast, GC/chlamydia.  She declines serum STD testing.  Mycolog II.    An After Visit Summary was printed and given to the patient.  __15____ minutes face to face time of which over 50% was spent in counseling.

## 2019-12-01 NOTE — Patient Instructions (Signed)
Vaginitis Vaginitis is a condition in which the vaginal tissue swells and becomes red (inflamed). This condition is most often caused by a change in the normal balance of bacteria and yeast that live in the vagina. This change causes an overgrowth of certain bacteria or yeast, which causes the inflammation. There are different types of vaginitis, but the most common types are:  Bacterial vaginosis.  Yeast infection (candidiasis).  Trichomoniasis vaginitis. This is a sexually transmitted disease (STD).  Viral vaginitis.  Atrophic vaginitis.  Allergic vaginitis. What are the causes? The cause of this condition depends on the type of vaginitis. It can be caused by:  Bacteria (bacterial vaginosis).  Yeast, which is a fungus (yeast infection).  A parasite (trichomoniasis vaginitis).  A virus (viral vaginitis).  Low hormone levels (atrophic vaginitis). Low hormone levels can occur during pregnancy, breastfeeding, or after menopause.  Irritants, such as bubble baths, scented tampons, and feminine sprays (allergic vaginitis). Other factors can change the normal balance of the yeast and bacteria that live in the vagina. These include:  Antibiotic medicines.  Poor hygiene.  Diaphragms, vaginal sponges, spermicides, birth control pills, and intrauterine devices (IUD).  Sex.  Infection.  Uncontrolled diabetes.  A weakened defense (immune) system. What increases the risk? This condition is more likely to develop in women who:  Smoke.  Use vaginal douches, scented tampons, or scented sanitary pads.  Wear tight-fitting pants.  Wear thong underwear.  Use oral birth control pills or an IUD.  Have sex without a condom.  Have multiple sex partners.  Have an STD.  Frequently use the spermicide nonoxynol-9.  Eat lots of foods high in sugar.  Have uncontrolled diabetes.  Have low estrogen levels.  Have a weakened immune system from an immune disorder or medical  treatment.  Are pregnant or breastfeeding. What are the signs or symptoms? Symptoms vary depending on the cause of the vaginitis. Common symptoms include:  Abnormal vaginal discharge. ? The discharge is white, gray, or yellow with bacterial vaginosis. ? The discharge is thick, white, and cheesy with a yeast infection. ? The discharge is frothy and yellow or greenish with trichomoniasis.  A bad vaginal smell. The smell is fishy with bacterial vaginosis.  Vaginal itching, pain, or swelling.  Sex that is painful.  Pain or burning when urinating. Sometimes there are no symptoms. How is this diagnosed? This condition is diagnosed based on your symptoms and medical history. A physical exam, including a pelvic exam, will also be done. You may also have other tests, including:  Tests to determine the pH level (acidity or alkalinity) of your vagina.  A whiff test, to assess the odor that results when a sample of your vaginal discharge is mixed with a potassium hydroxide solution.  Tests of vaginal fluid. A sample will be examined under a microscope. How is this treated? Treatment varies depending on the type of vaginitis you have. Your treatment may include:  Antibiotic creams or pills to treat bacterial vaginosis and trichomoniasis.  Antifungal medicines, such as vaginal creams or suppositories, to treat a yeast infection.  Medicine to ease discomfort if you have viral vaginitis. Your sexual partner should also be treated.  Estrogen delivered in a cream, pill, suppository, or vaginal ring to treat atrophic vaginitis. If vaginal dryness occurs, lubricants and moisturizing creams may help. You may need to avoid scented soaps, sprays, or douches.  Stopping use of a product that is causing allergic vaginitis. Then using a vaginal cream to treat the symptoms. Follow   these instructions at home: Lifestyle  Keep your genital area clean and dry. Avoid soap, and only rinse the area with  water.  Do not douche or use tampons until your health care provider says it is okay to do so. Use sanitary pads, if needed.  Do not have sex until your health care provider approves. When you can return to sex, practice safe sex and use condoms.  Wipe from front to back. This avoids the spread of bacteria from the rectum to the vagina. General instructions  Take over-the-counter and prescription medicines only as told by your health care provider.  If you were prescribed an antibiotic medicine, take or use it as told by your health care provider. Do not stop taking or using the antibiotic even if you start to feel better.  Keep all follow-up visits as told by your health care provider. This is important. How is this prevented?  Use mild, non-scented products. Do not use things that can irritate the vagina, such as fabric softeners. Avoid the following products if they are scented: ? Feminine sprays. ? Detergents. ? Tampons. ? Feminine hygiene products. ? Soaps or bubble baths.  Let air reach your genital area. ? Wear cotton underwear to reduce moisture buildup. ? Avoid wearing underwear while you sleep. ? Avoid wearing tight pants and underwear or nylons without a cotton panel. ? Avoid wearing thong underwear.  Take off any wet clothing, such as bathing suits, as soon as possible.  Practice safe sex and use condoms. Contact a health care provider if:  You have abdominal pain.  You have a fever.  You have symptoms that last for more than 2-3 days. Get help right away if:  You have a fever and your symptoms suddenly get worse. Summary  Vaginitis is a condition in which the vaginal tissue becomes inflamed.This condition is most often caused by a change in the normal balance of bacteria and yeast that live in the vagina.  Treatment varies depending on the type of vaginitis you have.  Do not douche, use tampons , or have sex until your health care provider approves. When  you can return to sex, practice safe sex and use condoms. This information is not intended to replace advice given to you by your health care provider. Make sure you discuss any questions you have with your health care provider. Document Revised: 07/05/2017 Document Reviewed: 08/28/2016 Elsevier Patient Education  2020 Elsevier Inc.  

## 2019-12-03 LAB — CERVICOVAGINAL ANCILLARY ONLY
Bacterial Vaginitis (gardnerella): POSITIVE — AB
Candida Glabrata: POSITIVE — AB
Candida Vaginitis: NEGATIVE
Chlamydia: NEGATIVE
Comment: NEGATIVE
Comment: NEGATIVE
Comment: NEGATIVE
Comment: NEGATIVE
Comment: NEGATIVE
Comment: NORMAL
Neisseria Gonorrhea: NEGATIVE
Trichomonas: NEGATIVE

## 2019-12-04 ENCOUNTER — Telehealth: Payer: Self-pay

## 2019-12-04 MED ORDER — METRONIDAZOLE 500 MG PO TABS: 500.0000 mg | ORAL_TABLET | Freq: Two times a day (BID) | ORAL | 0 refills | Status: DC

## 2019-12-04 MED ORDER — NONFORMULARY OR COMPOUNDED ITEM: 0 refills | Status: DC

## 2019-12-04 NOTE — Telephone Encounter (Signed)
-----   Message from Patton Salles, MD sent at 12/04/2019 11:11 AM EDT ----- Please inform of Affirm result showing bacterial vaginosis and yeast, called Candida Glabrata. She may treat the BV with Flagyl 500 mg po bid for 7 days or Metrogel pv at hs for 5 nights.  ETOH precautions.  For the yeast, she will need to use boric acid 600 mg per VAGINA at night for 2 weeks.  Boric acid is NEVER taken orally.  (Candida Ellard Artis is not treated with Diflucan.) I recommend she abstains from intercourse while treating these infections.  She tested negative for gonorrhea, chlamydia, and trichomonas.  I do not recall if she is traveling for the weekend.

## 2019-12-04 NOTE — Telephone Encounter (Signed)
Spoke with pt. Pt given results and recommendations per Dr Edward Jolly. Pt agreeable and verbalized understanding.   Rx sent Flagyl # 14, 0RF sent to pharmacy on file.   Rx for Boric Acid to [facility].   Routing to [place], Charity fundraiser for Rx

## 2019-12-04 NOTE — Telephone Encounter (Signed)
Rx for Boric acid vaginal suppositories entered in Epic.  Call placed to [facility], spoke with Selena Batten.Verbal Order for Boric acid 600 mg vaginal suppository. Place one suppository vaginally nightly for 14 days. Dispense: #14/0RF Read back and confirmed.   Call to patient, advised of Rx as seen above. Advised patient [facility] will contact her when Rx is ready for pick up. Patient read back instructions.   Routing to provider for final review. Patient is agreeable to disposition. Will close encounter.

## 2019-12-18 NOTE — Progress Notes (Signed)
Virtual Visit via Video Note   This visit type was conducted due to national recommendations for restrictions regarding the COVID-19 Pandemic (e.g. social distancing) in an effort to limit this patient's exposure and mitigate transmission in our community.  Due to her co-morbid illnesses, this patient is at least at moderate risk for complications without adequate follow up.  This format is felt to be most appropriate for this patient at this time.  All issues noted in this document were discussed and addressed.  A limited physical exam was performed with this format.  Please refer to the patient's chart for her consent to telehealth for [facility].   Date:  12/24/2019   ID:  Kristen [name], DOB 01/02/1971, MRN [id number]  Patient Location:Home Provider Location: Home  PCP:  Pleas Koch, NP  Cardiologist:  Dr Stanford Breed  Evaluation Performed:  Follow-Up Visit chest pain  Chief Complaint:    History of Present Illness:    Patient seen with chest pain February 2019. Echo March 2019 showed normal LV function, moderate diastolic dysfunction.  Exercise treadmill March 2019 no ischemia.  CTA of neck August 2019 negative.  Since last seen she denies dyspnea, chest pain, palpitations or syncope.  The patient does not have symptoms concerning for COVID-19 infection (fever, chills, cough, or new shortness of breath).    Past Medical History:  Diagnosis Date  . Abnormal Pap smear of cervix    dysplasia   . Anemia    history of  . Anxiety   . Asthma    seasonal asthma, has not required use of inhaler in over 2 years  . Cervical dysplasia 1993  . Depression   . Diverticulitis   . Dysmenorrhea   . Essential hypertension   . GERD (gastroesophageal reflux disease)   . Headache    history of migraines  . IBS (irritable bowel syndrome)   . STD (sexually transmitted disease) ~1998   chlamydia    Past Surgical History:  Procedure Laterality Date  . ABDOMINAL HYSTERECTOMY      . ADENOIDECTOMY     lymph nodes removed  . APPENDECTOMY  1990  . COLONOSCOPY    . COLPOSCOPY  1992  . CYSTOSCOPY N/A 02/12/2017   Procedure: CYSTOSCOPY;  Surgeon: Megan Salon, MD;  Location: [facility];  Service: Gynecology;  Laterality: N/A;  . TONSILLECTOMY    . TOTAL LAPAROSCOPIC HYSTERECTOMY WITH SALPINGECTOMY Bilateral 02/12/2017   Procedure: HYSTERECTOMY TOTAL LAPAROSCOPIC WITH SALPINGECTOMY;  Surgeon: Megan Salon, MD;  Location: [facility];  Service: Gynecology;  Laterality: Bilateral;  . TUBAL LIGATION       Current Meds  Medication Sig  . albuterol (PROVENTIL HFA;VENTOLIN HFA) 108 (90 Base) MCG/ACT inhaler Inhale 2 puffs into the lungs every 4 (four) hours as needed.  Marland Kitchen amLODipine (NORVASC) 10 MG tablet Take 1 tablet (10 mg total) by mouth daily.  . Estradiol (DIVIGEL) 0.5 MG/0.5GM GEL APPLY 1 PACKET TOPICALLY DAILY  . pantoprazole (PROTONIX) 40 MG tablet Take 1 tablet (40 mg total) by mouth daily.  . temazepam (RESTORIL) 15 MG capsule TAKE ONE CAPSULE BY MOUTH EACH NIGHT AT BEDTIME AS NEEDED FOR SLEEP     Allergies:   Amoxicillin, Doxycycline, and Penicillin g   Social History   Tobacco Use  . Smoking status: Never Smoker  . Smokeless tobacco: Never Used  Substance Use Topics  . Alcohol use: No  . Drug use: No     Family Hx: The patient's family history includes Colon cancer (  age of onset: 61) in her father; Depression in her mother; Diabetes in her brother and maternal grandmother; Hypertension in her maternal grandmother and mother. There is no history of Stomach cancer or Breast cancer.  ROS:   Please see the history of present illness.    No Fever, chills  or productive cough All other systems reviewed and are negative.  Recent Labs: 03/06/2019: ALT 13; BUN 9; Creatinine, Ser 0.89; Hemoglobin 13.3; Platelets 360; Potassium 4.4; Sodium 142; TSH 3.730   Recent Lipid Panel Lab Results  Component Value Date/Time   CHOL 128 03/06/2019 09:18 AM   TRIG 66  03/06/2019 09:18 AM   HDL 47 03/06/2019 09:18 AM   CHOLHDL 2.7 03/06/2019 09:18 AM   CHOLHDL 3 05/29/2018 10:02 AM   LDLCALC 68 03/06/2019 09:18 AM    Wt Readings from Last 3 Encounters:  12/24/19 210 lb (95.3 kg)  12/01/19 210 lb (95.3 kg)  03/06/19 219 lb (99.3 kg)     Objective:    Vital Signs:  BP (!) 131/95   Pulse 69   Ht 5\' 1"  (1.549 m)   Wt 210 lb (95.3 kg)   LMP 01/27/2017 (Exact Date)   BMI 39.68 kg/m    VITAL SIGNS:  reviewed NAD Answers questions appropriately Normal affect Remainder of physical examination not performed (telehealth visit; coronavirus pandemic)  ASSESSMENT & PLAN:    1. Chest pain-as noted previous symptoms were atypical.  She has had no recurrences.  Previous exercise treadmill normal and echocardiogram showed normal LV function.  We will not pursue further ischemia evaluation.  Will consider cardiac CTA in the future if she has recurrences. 2. Hypertension-blood pressure elevated.  Continue amlodipine at present dose.  Add HCTZ 25 mg daily.  Check potassium and renal function in 1 week.  Follow blood pressure and adjust regimen as needed. 3. History of diastolic dysfunction-she does not have overt CHF.  Continue blood pressure control and low-sodium diet. 4. Obesity-we discussed the importance of continuing weight loss, exercise and diet.  COVID-19 Education: The importance of social distancing was discussed today.  Time:   Today, I have spent 17 minutes with the patient with telehealth technology discussing the above problems.     Medication Adjustments/Labs and Tests Ordered: Current medicines are reviewed at length with the patient today.  Concerns regarding medicines are outlined above.   Tests Ordered: No orders of the defined types were placed in this encounter.   Medication Changes: No orders of the defined types were placed in this encounter.   Follow Up:  Either In Person or Virtual in 1 year(s)  Signed, 01/29/2017,  MD  12/24/2019 9:47 AM    [facility]

## 2019-12-24 ENCOUNTER — Encounter: Payer: Self-pay | Admitting: Cardiology

## 2019-12-24 ENCOUNTER — Telehealth (INDEPENDENT_AMBULATORY_CARE_PROVIDER_SITE_OTHER): Payer: No Typology Code available for payment source | Admitting: Cardiology

## 2019-12-24 VITALS — BP 131/95 | HR 69 | Ht 61.0 in | Wt 210.0 lb

## 2019-12-24 DIAGNOSIS — I10 Essential (primary) hypertension: Secondary | ICD-10-CM

## 2019-12-24 DIAGNOSIS — I5189 Other ill-defined heart diseases: Secondary | ICD-10-CM | POA: Diagnosis not present

## 2019-12-24 DIAGNOSIS — R072 Precordial pain: Secondary | ICD-10-CM | POA: Diagnosis not present

## 2019-12-24 MED ORDER — HYDROCHLOROTHIAZIDE 25 MG PO TABS: 25.0000 mg | ORAL_TABLET | Freq: Every day | ORAL | 3 refills | Status: DC

## 2019-12-24 NOTE — Patient Instructions (Signed)
Medication Instructions:   START HCTZ 25 MG ONCE DAILY  *If you need a refill on your cardiac medications before your next appointment, please call your pharmacy*   Lab Work:  Your physician recommends that you return for lab work in: ONE WEEK   If you have labs (blood work) drawn today and your tests are completely normal, you will receive your results only by: Marland Kitchen MyChart Message (if you have MyChart) OR . A paper copy in the mail If you have any lab test that is abnormal or we need to change your treatment, we will call you to review the results.   Follow-Up: At [facility], you and your health needs are our priority.  As part of our continuing mission to provide you with exceptional heart care, we have created designated Provider Care Teams.  These Care Teams include your primary Cardiologist (physician) and Advanced Practice Providers (APPs -  Physician Assistants and Nurse Practitioners) who all work together to provide you with the care you need, when you need it.  We recommend signing up for the patient portal called "MyChart".  Sign up information is provided on this After Visit Summary.  MyChart is used to connect with patients for Virtual Visits (Telemedicine).  Patients are able to view lab/test results, encounter notes, upcoming appointments, etc.  Non-urgent messages can be sent to your provider as well.   To learn more about what you can do with MyChart, go to [email or web address].    Your next appointment:   12 month(s)  The format for your next appointment:   Either In Person or Virtual  Provider:   You may see Olga Millers MD or one of the following Advanced Practice Providers on your designated Care Team:    Corine Shelter, PA-C  [place], [place]  Edd Fabian, [place]

## 2020-02-25 ENCOUNTER — Other Ambulatory Visit: Payer: Self-pay | Admitting: Physical Medicine and Rehabilitation

## 2020-02-25 ENCOUNTER — Other Ambulatory Visit: Payer: Self-pay | Admitting: Obstetrics & Gynecology

## 2020-02-25 DIAGNOSIS — R0989 Other specified symptoms and signs involving the circulatory and respiratory systems: Secondary | ICD-10-CM

## 2020-02-25 DIAGNOSIS — Z1231 Encounter for screening mammogram for malignant neoplasm of breast: Secondary | ICD-10-CM

## 2020-02-29 ENCOUNTER — Encounter: Payer: Self-pay | Admitting: Primary Care

## 2020-02-29 ENCOUNTER — Ambulatory Visit (INDEPENDENT_AMBULATORY_CARE_PROVIDER_SITE_OTHER): Payer: No Typology Code available for payment source | Admitting: Primary Care

## 2020-02-29 ENCOUNTER — Other Ambulatory Visit: Payer: Self-pay

## 2020-02-29 VITALS — BP 116/94 | HR 70 | Temp 95.5°F | Ht 61.5 in | Wt 210.5 lb

## 2020-02-29 DIAGNOSIS — E669 Obesity, unspecified: Secondary | ICD-10-CM | POA: Diagnosis not present

## 2020-02-29 DIAGNOSIS — E559 Vitamin D deficiency, unspecified: Secondary | ICD-10-CM | POA: Diagnosis not present

## 2020-02-29 DIAGNOSIS — R232 Flushing: Secondary | ICD-10-CM | POA: Diagnosis not present

## 2020-02-29 DIAGNOSIS — E049 Nontoxic goiter, unspecified: Secondary | ICD-10-CM | POA: Diagnosis not present

## 2020-02-29 DIAGNOSIS — I10 Essential (primary) hypertension: Secondary | ICD-10-CM

## 2020-02-29 DIAGNOSIS — G4700 Insomnia, unspecified: Secondary | ICD-10-CM

## 2020-02-29 MED ORDER — AMLODIPINE BESYLATE 5 MG PO TABS: 5.0000 mg | ORAL_TABLET | Freq: Every day | ORAL | 0 refills | Status: DC

## 2020-02-29 NOTE — Assessment & Plan Note (Signed)
Stopped estradiol gel "cold [place]" several months ago and did well. Suspect our very hot Summer weather to be causing a return in symptoms.  She will update GYN.

## 2020-02-29 NOTE — Assessment & Plan Note (Signed)
Discussed the importance of a healthy diet and regular exercise in order for weight loss, and to reduce the risk of any potential medical problems.  Repeat lipids pending. 

## 2020-02-29 NOTE — Assessment & Plan Note (Signed)
Noted during visit today, no palpable nodules. Checking TSH and Free T4. [place] of thyroid ordered and pending.

## 2020-02-29 NOTE — Assessment & Plan Note (Signed)
Repeat vitamin D level pending. Not currently on supplementation.

## 2020-02-29 NOTE — Assessment & Plan Note (Signed)
No longer on temazepam, continue to monitor.

## 2020-02-29 NOTE — Progress Notes (Signed)
Subjective:    Patient ID: Kristen [name], female    DOB: Jun 24, 1971, 49 y.o.   MRN: [id number]  HPI  This visit occurred during the SARS-CoV-2 public health emergency.  Safety protocols were in place, including screening questions prior to the visit, additional usage of staff PPE, and extensive cleaning of exam room while observing appropriate contact time as indicated for disinfecting solutions.   Ms. Kristen [name] is a 49 year old female with a history of hypertension, hot flashes, GERD, intracranial hypertension, fatigue, GAD who presents today for follow up.  She recently followed up virtually with her cardiologist, is needing an updated BMP. She is pending carotid dopplers for new pulsation to right anterior lower neck. She is compliant to HCTZ 25 mg, but stopped amlodipine months ago due to pounding headaches. She's checkingher BP at home which is running 120's/90's-101.   She continues to notice throat fullness and swelling to her anterior neck, chronic for years but feels as this has progressed. She last underwent a thyroid ultrasound in 2017 which showed heterogenous thyroid with an enlarged size.   She continues to follow with GYN for annual exams. Will be resuming her estradiol gel given a return in hot flashes.    BP Readings from Last 3 Encounters:  02/29/20 (!) 116/94  12/24/19 (!) 131/95  12/01/19 140/82     Review of Systems  Respiratory: Negative for shortness of breath.   Cardiovascular: Negative for chest pain.  Endocrine:       Anterior neck swelling  Genitourinary:       Hot flashes  Neurological: Negative for dizziness and headaches.       Past Medical History:  Diagnosis Date  . Abnormal Pap smear of cervix    dysplasia   . Anemia    history of  . Anxiety   . Asthma    seasonal asthma, has not required use of inhaler in over 2 years  . Cervical dysplasia 1993  . Depression   . Diverticulitis   . Dysmenorrhea   . Essential hypertension   . GERD  (gastroesophageal reflux disease)   . Headache    history of migraines  . IBS (irritable bowel syndrome)   . STD (sexually transmitted disease) ~1998   chlamydia      Social History   Socioeconomic History  . Marital status: Married    Spouse name: Not on file  . Number of children: 3  . Years of education: 34  . Highest education level: Not on file  Occupational History    Comment: [employer], Health Coach  Tobacco Use  . Smoking status: Never Smoker  . Smokeless tobacco: Never Used  Vaping Use  . Vaping Use: Never used  Substance and Sexual Activity  . Alcohol use: No  . Drug use: No  . Sexual activity: Not Currently    Partners: Male    Birth control/protection: Surgical  Other Topics Concern  . Not on file  Social History Narrative   Separated.    3 children.    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    Social Determinants of Health   Financial Resource Strain:   . Difficulty of Paying Living Expenses:   Food Insecurity:   . Worried About Programme researcher, broadcasting/film/video in the Last Year:   . Barista in the Last Year:   Transportation Needs:   . Freight forwarder (Medical):   Marland Kitchen  Lack of Transportation (Non-Medical):   Physical Activity:   . Days of Exercise per Week:   . Minutes of Exercise per Session:   Stress:   . Feeling of Stress :   Social Connections:   . Frequency of Communication with Friends and Family:   . Frequency of Social Gatherings with Friends and Family:   . Attends Religious Services:   . Active Member of Clubs or Organizations:   . Attends Banker Meetings:   Marland Kitchen Marital Status:   Intimate Partner Violence:   . Fear of Current or Ex-Partner:   . Emotionally Abused:   Marland Kitchen Physically Abused:   . Sexually Abused:     Past Surgical History:  Procedure Laterality Date  . ABDOMINAL HYSTERECTOMY    . ADENOIDECTOMY     lymph nodes removed  . APPENDECTOMY  1990  .  COLONOSCOPY    . COLPOSCOPY  1992  . CYSTOSCOPY N/A 02/12/2017   Procedure: CYSTOSCOPY;  Surgeon: Jerene Bears, MD;  Location: [facility];  Service: Gynecology;  Laterality: N/A;  . TONSILLECTOMY    . TOTAL LAPAROSCOPIC HYSTERECTOMY WITH SALPINGECTOMY Bilateral 02/12/2017   Procedure: HYSTERECTOMY TOTAL LAPAROSCOPIC WITH SALPINGECTOMY;  Surgeon: Jerene Bears, MD;  Location: [facility];  Service: Gynecology;  Laterality: Bilateral;  . TUBAL LIGATION      Family History  Problem Relation Age of Onset  . Hypertension Mother   . Depression Mother   . Diabetes Maternal Grandmother   . Hypertension Maternal Grandmother   . Colon cancer Father 19  . Diabetes Brother   . Stomach cancer Neg Hx   . Breast cancer Neg Hx     Allergies  Allergen Reactions  . Amoxicillin Nausea Only    Has patient had a PCN reaction causing immediate rash, facial/tongue/throat swelling, SOB or lightheadedness with hypotension: No Has patient had a PCN reaction causing severe rash involving mucus membranes or skin necrosis: No Has patient had a PCN reaction that required hospitalization: No Has patient had a PCN reaction occurring within the last 10 years: Yes If all of the above answers are "NO", then may proceed with Cephalosporin use.   Marland Kitchen Doxycycline   . Penicillin G Rash    Has patient had a PCN reaction causing immediate rash, facial/tongue/throat swelling, SOB or lightheadedness with hypotension: Unknown Has patient had a PCN reaction causing severe rash involving mucus membranes or skin necrosis: Unknown Has patient had a PCN reaction that required hospitalization: Unknown Has patient had a PCN reaction occurring within the last 10 years: No If all of the above answers are "NO", then may proceed with Cephalosporin use.     Current Outpatient Medications on File Prior to Visit  Medication Sig Dispense Refill  . albuterol (PROVENTIL HFA;VENTOLIN HFA) 108 (90 Base) MCG/ACT inhaler Inhale 2 puffs into the  lungs every 4 (four) hours as needed. 1 Inhaler 0  . Estradiol (DIVIGEL) 0.5 MG/0.5GM GEL APPLY 1 PACKET TOPICALLY DAILY 90 each 4  . hydrochlorothiazide (HYDRODIURIL) 25 MG tablet Take 1 tablet (25 mg total) by mouth daily. 90 tablet 3  . pantoprazole (PROTONIX) 40 MG tablet Take 1 tablet (40 mg total) by mouth daily. 90 tablet 1   No current facility-administered medications on file prior to visit.    BP (!) 116/94 (BP Location: Right Arm, Patient Position: Sitting, Cuff Size: Normal)   Pulse 70   Temp (!) 95.5 F (35.3 C) (Temporal)   Ht 5' 1.5" (1.562 m)   Wt Marland Kitchen)  210 lb 8 oz (95.5 kg)   LMP 01/27/2017 (Exact Date)   SpO2 97%   BMI 39.13 kg/m    Objective:   Physical Exam Neck:     Vascular: No carotid bruit.     Comments: Enlarged anterior neck over thyroid gland. No palpable nodules.  Cardiovascular:     Rate and Rhythm: Normal rate and regular rhythm.     Comments: Pulsation noted to right carotid at base of neck Pulmonary:     Effort: Pulmonary effort is normal.     Breath sounds: Normal breath sounds.  Musculoskeletal:     Cervical back: Neck supple.  Skin:    General: Skin is warm and dry.            Assessment & Plan:

## 2020-02-29 NOTE — Assessment & Plan Note (Signed)
Diastolic reading continues to remain above goal despite HCTZ 25 mg. Could not tolerate amlodipine 10 mg due to headaches.  She agrees to retry amlodipine at a 5 mg dose, this may be enough to lower diastolic reading while preventing headaches.   She will send BP readings in 3 weeks.

## 2020-02-29 NOTE — Patient Instructions (Addendum)
Stop by the lab prior to leaving today. I will notify you of your results once received.   You will be contacted regarding your thyroid ultrasound.  Please let us know if you have not been contacted within two weeks.   Start amlodipine 5 mg once daily for blood pressure.  Continue hydrochlorothiazide 25 mg for blood pressure.  Please send me some blood pressure readings in 3 weeks via my chart.  It was a pleasure to see you today!

## 2020-03-01 ENCOUNTER — Encounter: Payer: Self-pay | Admitting: Obstetrics & Gynecology

## 2020-03-01 ENCOUNTER — Telehealth: Payer: Self-pay

## 2020-03-01 LAB — LIPID PANEL
Cholesterol: 142 mg/dL (ref 0–200)
HDL: 50.6 mg/dL (ref >39.00)
LDL Cholesterol: 73 mg/dL (ref 0–99)
NonHDL: 90.94
Total CHOL/HDL Ratio: 3
Triglycerides: 92 mg/dL (ref 0.0–149.0)
VLDL: 18.4 mg/dL (ref 0.0–40.0)

## 2020-03-01 LAB — COMPREHENSIVE METABOLIC PANEL
ALT: 16 U/L (ref 0–35)
AST: 13 U/L (ref 0–37)
Albumin: 4.2 g/dL (ref 3.5–5.2)
Alkaline Phosphatase: 44 U/L (ref 39–117)
BUN: 12 mg/dL (ref 6–23)
CO2: 30 mEq/L (ref 19–32)
Calcium: 9.6 mg/dL (ref 8.4–10.5)
Chloride: 102 mEq/L (ref 96–112)
Creatinine, Ser: 1 mg/dL (ref 0.40–1.20)
GFR: 71.43 mL/min (ref >60.00)
Glucose, Bld: 75 mg/dL (ref 70–99)
Potassium: 4 mEq/L (ref 3.5–5.1)
Sodium: 138 mEq/L (ref 135–145)
Total Bilirubin: 0.4 mg/dL (ref 0.2–1.2)
Total Protein: 7.7 g/dL (ref 6.0–8.3)

## 2020-03-01 LAB — ESTRADIOL: Estradiol: 16 pg/mL

## 2020-03-01 LAB — TSH: TSH: 1.73 u[IU]/mL (ref 0.35–4.50)

## 2020-03-01 LAB — T4, FREE: Free T4: 0.85 ng/dL (ref 0.60–1.60)

## 2020-03-01 LAB — VITAMIN D 25 HYDROXY (VIT D DEFICIENCY, FRACTURES): VITD: 15.53 ng/mL — ABNORMAL LOW (ref 30.00–100.00)

## 2020-03-01 NOTE — Telephone Encounter (Signed)
AEX 02/2019  Spoke with pt. Pt states seen PCP yesterday and had labs drawn. Pt states estradiol level is 16 and TSH is 1.73 and has known goiter.  Pt states tried to wean off 0.5 mg/day estradiol gel and hot flashes, mood swings, and night sweats returned. Pt wanting to know if needs to go back on estradiol patches and get refill on Progesterone and Restoril? Pharmacy verified.   Pt advised will review with Dr Hyacinth Meeker and return call. Pt agreeable.   Routing to Dr Hyacinth Meeker.

## 2020-03-01 NOTE — Telephone Encounter (Signed)
[name], Kristen "Virl Axe"  P [facility] Pool Dr. Hyacinth Meeker,   Estradiol level is 16. Tried to wean off HRT. Hot flashes came back with a vengeance. Can you please prescribe some Estrogen patches? Also need a refill of Progesterone for my mood and Restoril for sleep. My prescriptions are probably expired. Please send to [facility] employee pharmacy.   Thank you. Hope all is well.   Virl Axe

## 2020-03-03 ENCOUNTER — Other Ambulatory Visit: Payer: Self-pay

## 2020-03-03 ENCOUNTER — Ambulatory Visit (HOSPITAL_COMMUNITY)
Admission: RE | Admit: 2020-03-03 | Discharge: 2020-03-03 | Disposition: A | Discharge status: Home / Self Care | Payer: No Typology Code available for payment source | Source: Ambulatory Visit | Attending: Cardiovascular Disease | Admitting: Cardiovascular Disease

## 2020-03-03 DIAGNOSIS — R0989 Other specified symptoms and signs involving the circulatory and respiratory systems: Secondary | ICD-10-CM | POA: Diagnosis not present

## 2020-03-07 ENCOUNTER — Ambulatory Visit
Admission: RE | Admit: 2020-03-07 | Discharge: 2020-03-07 | Disposition: A | Discharge status: Home / Self Care | Payer: No Typology Code available for payment source | Source: Ambulatory Visit | Attending: Primary Care | Admitting: Primary Care

## 2020-03-07 DIAGNOSIS — E049 Nontoxic goiter, unspecified: Secondary | ICD-10-CM

## 2020-03-07 NOTE — Telephone Encounter (Signed)
Patient called to get an update from Dr.Miller's nurse.

## 2020-03-08 ENCOUNTER — Other Ambulatory Visit: Payer: Self-pay | Admitting: Obstetrics & Gynecology

## 2020-03-08 MED ORDER — TEMAZEPAM 15 MG PO CAPS
15.0000 mg | ORAL_CAPSULE | Freq: Every evening | ORAL | 0 refills | Status: DC | PRN
Start: 2020-03-08 — End: 2020-03-08

## 2020-03-08 MED ORDER — TEMAZEPAM 15 MG PO CAPS: 15.0000 mg | ORAL_CAPSULE | Freq: Every evening | ORAL | 0 refills | Status: DC | PRN

## 2020-03-08 NOTE — Telephone Encounter (Signed)
Spoke with pt. Pt given update and recommendations per Dr Hyacinth Meeker.   1. Pt states was trying to wean off and that's why she stopped using HRT, but had hot flashes return and are much worse than before using HRT. Pt states started using old Rx Vivelle Dot patches again after seeing Dr Chestine Spore 02/29/20. Pt states only having 1 week of patches left. Pt states patches have resolved vasomotor sx and feeling much better.   2. Pt has not used estrogen gel due to not resolving sx. Patches before gave skin blisters at site. Pt denies having blisters at this time.   3. Pt verbalized understanding of Rx Restoril. Pt has AEX scheduled for 05/19/20 and is agreeable to keep appt.   Pt asking to have OV to discuss HRT and get refills. Pt scheduled for 8/6 at 330 pm. Pt agreeable to date and time of appt.   Routing to Dr Hyacinth Meeker for update Encounter closed.

## 2020-03-08 NOTE — Telephone Encounter (Signed)
Routing to Dr Miller for review and recommendations.  

## 2020-03-08 NOTE — Telephone Encounter (Signed)
1) Why did she stop HRT?  From Ms. Clark's last note in July, it appears she was doing ok off HRT.  What's changed.  2)  she was on estradiol gel, no the patch.  So, why does she want to use the patch again.  We tried this about two years ago but ended up switching away from it.  3) I can write the temazepam but she does need to be seen yearly if I'm going to write this for her, even if she only uses it rarely.  Rx for 15mg  has been sent to pharmacy.

## 2020-03-09 ENCOUNTER — Other Ambulatory Visit: Payer: Self-pay | Admitting: Obstetrics & Gynecology

## 2020-03-09 MED ORDER — ESTRADIOL 0.1 MG/24HR TD PTTW: 1.0000 | MEDICATED_PATCH | TRANSDERMAL | 0 refills | Status: DC

## 2020-03-10 NOTE — Progress Notes (Signed)
GYNECOLOGY  VISIT  CC:   Discuss HRT, insomnia  HPI: 49 y.o. [phone] Married [place] or [place] American female here for HRT consult.  Pt underwent hysterectomy in 02/2017.  Shortly after surgery, started having vasomotor symptoms.  Pt was started on oral estradiol which didn't help enough, then transitioned to patch which caused skin irritation after a while and then then estradiol gel.  She is very honest that she is non-compliant with almost all of her medications and she takes them intermittently.  However, a few months ago, she decided to just completely stop her HRT.  After about 8 weeks, symptoms came back with a vengeance.  She was having severe hot flashes and night sweats and sleep disturbance.  She had a few patches left over and started these back about 10 days ago.  Symptoms are already so much better.  She called for refill for patch and I recommend OV as it's been about a year since she as last here and she was requesting a medication that I haven't written for her in much longer than a year.  Via phone communication, I just wasn't clear about why she wanted to change/switch.  Also needs refill for restoril.  Doesn't use often, typically less than one rx a year but does feel reassured knowing it is on hand.  Sleep is already so much better with starting the vivelle dot.  Risks and benefits reviewed.  Options for treatment discussed.  Feel she would benefit   GYNECOLOGIC HISTORY: Patient's last menstrual period was 01/27/2017 (exact date). Contraception: hysterectomy Menopausal hormone therapy: vivelle-dot patch  Patient Active Problem List   Diagnosis Date Noted  . Goiter 02/29/2020  . Vitamin D deficiency 02/29/2020  . Chronic right hip pain 05/30/2018  . Abnormal facial hair 05/30/2018  . GAD (generalized anxiety disorder) 04/25/2018  . Chest pain 09/04/2017  . Hot flashes 05/28/2017  . Obesity (BMI 30-39.9) 09/03/2016  . Intracranial hypertension, benign 06/22/2016  . Visual  disturbance 05/18/2016  . Other fatigue 03/01/2016  . Preventative health care 01/03/2016  . Insomnia 11/29/2015  . Essential hypertension 11/11/2015  . GERD (gastroesophageal reflux disease) 11/11/2015    Past Medical History:  Diagnosis Date  . Abnormal Pap smear of cervix    dysplasia   . Acute sinusitis 05/26/2018  . Anemia    history of  . Anxiety   . Asthma    seasonal asthma, has not required use of inhaler in over 2 years  . Cervical dysplasia 1993  . Depression   . Diverticulitis   . Dysmenorrhea   . Essential hypertension   . GERD (gastroesophageal reflux disease)   . Headache    history of migraines  . IBS (irritable bowel syndrome)   . STD (sexually transmitted disease) ~1998   chlamydia     Past Surgical History:  Procedure Laterality Date  . ABDOMINAL HYSTERECTOMY    . ADENOIDECTOMY     lymph nodes removed  . APPENDECTOMY  1990  . COLONOSCOPY    . COLPOSCOPY  1992  . CYSTOSCOPY N/A 02/12/2017   Procedure: CYSTOSCOPY;  Surgeon: Jerene Bears, MD;  Location: [facility];  Service: Gynecology;  Laterality: N/A;  . TONSILLECTOMY    . TOTAL LAPAROSCOPIC HYSTERECTOMY WITH SALPINGECTOMY Bilateral 02/12/2017   Procedure: HYSTERECTOMY TOTAL LAPAROSCOPIC WITH SALPINGECTOMY;  Surgeon: Jerene Bears, MD;  Location: [facility];  Service: Gynecology;  Laterality: Bilateral;  . TUBAL LIGATION      MEDS:   Current Outpatient Medications on  File Prior to Visit  Medication Sig Dispense Refill  . albuterol (PROVENTIL HFA;VENTOLIN HFA) 108 (90 Base) MCG/ACT inhaler Inhale 2 puffs into the lungs every 4 (four) hours as needed. 1 Inhaler 0  . amLODipine (NORVASC) 5 MG tablet Take 1 tablet (5 mg total) by mouth daily. For blood pressure. 30 tablet 0  . estradiol (VIVELLE-DOT) 0.1 MG/24HR patch Place 1 patch (0.1 mg total) onto the skin 2 (two) times a week. 4 patch 0  . hydrochlorothiazide (HYDRODIURIL) 25 MG tablet Take 1 tablet (25 mg total) by mouth daily. 90 tablet 3  .  pantoprazole (PROTONIX) 40 MG tablet Take 1 tablet (40 mg total) by mouth daily. 90 tablet 1  . temazepam (RESTORIL) 15 MG capsule Take 1 capsule (15 mg total) by mouth at bedtime as needed for sleep. 30 capsule 0   No current facility-administered medications on file prior to visit.    ALLERGIES: Amoxicillin, Doxycycline, and Penicillin g  Family History  Problem Relation Age of Onset  . Hypertension Mother   . Depression Mother   . Diabetes Maternal Grandmother   . Hypertension Maternal Grandmother   . Colon cancer Father 65  . Diabetes Brother   . Stomach cancer Neg Hx   . Breast cancer Neg Hx     SH:  Married, non smoker  Review of Systems  Constitutional:       Hot flashes, irritability  HENT: Negative.   Eyes: Negative.   Respiratory: Negative.   Cardiovascular: Negative.   Gastrointestinal: Negative.   Endocrine: Negative.   Genitourinary: Negative.   Musculoskeletal: Negative.   Skin: Negative.   Allergic/Immunologic: Negative.   Neurological: Negative.   Hematological: Negative.   Psychiatric/Behavioral: Negative.     PHYSICAL EXAMINATION:    BP 120/78   Pulse 70   Resp 16   Wt 213 lb (96.6 kg)   LMP 01/27/2017 (Exact Date)   BMI 39.59 kg/m     General appearance: alert, cooperative and appears stated age No exam performed today  Assessment: Vasomotor symptoms improved with vivelle dot Insomnia  Plan: Will change to climara patch 0.1mg  patches weekly to skin.  #12/1RF.  Advised to call if feels moody or has breast tenderness as we can adjust the dosage down.  Risks/benefits of use reviewed. Will wait at least month to consider staring Prometrium. RF for Restoril already done to pharmacy.   22 minutes spent with pt in direct discussion.

## 2020-03-11 ENCOUNTER — Encounter: Payer: Self-pay | Admitting: Obstetrics & Gynecology

## 2020-03-11 ENCOUNTER — Other Ambulatory Visit: Payer: Self-pay

## 2020-03-11 ENCOUNTER — Ambulatory Visit (INDEPENDENT_AMBULATORY_CARE_PROVIDER_SITE_OTHER): Payer: No Typology Code available for payment source | Admitting: Obstetrics & Gynecology

## 2020-03-11 VITALS — BP 120/78 | HR 70 | Resp 16 | Wt 213.0 lb

## 2020-03-11 DIAGNOSIS — Z79890 Hormone replacement therapy (postmenopausal): Secondary | ICD-10-CM

## 2020-03-11 DIAGNOSIS — N951 Menopausal and female climacteric states: Secondary | ICD-10-CM | POA: Diagnosis not present

## 2020-03-11 MED ORDER — ESTRADIOL 0.1 MG/24HR TD PTWK
0.1000 mg | MEDICATED_PATCH | TRANSDERMAL | 1 refills | Status: DC
Start: 2020-03-11 — End: 2020-04-15

## 2020-03-14 ENCOUNTER — Ambulatory Visit
Admission: RE | Admit: 2020-03-14 | Discharge: 2020-03-14 | Disposition: A | Discharge status: Home / Self Care | Payer: No Typology Code available for payment source | Source: Ambulatory Visit | Attending: Obstetrics & Gynecology | Admitting: Obstetrics & Gynecology

## 2020-03-14 ENCOUNTER — Other Ambulatory Visit: Payer: Self-pay

## 2020-03-14 ENCOUNTER — Encounter: Payer: Self-pay | Admitting: Obstetrics & Gynecology

## 2020-03-14 DIAGNOSIS — Z1231 Encounter for screening mammogram for malignant neoplasm of breast: Secondary | ICD-10-CM | POA: Insufficient documentation

## 2020-04-01 ENCOUNTER — Ambulatory Visit: Payer: No Typology Code available for payment source | Attending: Internal Medicine

## 2020-04-01 DIAGNOSIS — Z23 Encounter for immunization: Secondary | ICD-10-CM

## 2020-04-01 NOTE — Progress Notes (Signed)
   Covid-19 Vaccination Clinic  Name:  Kristen [name]    MRN: [id number] DOB: 1970-12-05  04/01/2020  [name] was observed post Covid-19 immunization for 15 minutes without incident. She was provided with Vaccine Information Sheet and instruction to access the V-Safe system.   [name] was instructed to call 911 with any severe reactions post vaccine: Marland Kitchen Difficulty breathing  . Swelling of face and throat  . A fast heartbeat  . A bad rash all over body  . Dizziness and weakness   Immunizations Administered    Name Date Dose VIS Date Route   Pfizer COVID-19 Vaccine 04/01/2020  8:21 AM 0.3 mL 09/30/2018 Intramuscular   Manufacturer: [employer], [employer]   Lot: [id number]   NDC: [address]-2

## 2020-04-15 ENCOUNTER — Other Ambulatory Visit: Payer: Self-pay | Admitting: Obstetrics & Gynecology

## 2020-04-15 ENCOUNTER — Telehealth: Payer: Self-pay | Admitting: *Deleted

## 2020-04-15 ENCOUNTER — Encounter: Payer: Self-pay | Admitting: Obstetrics & Gynecology

## 2020-04-15 MED ORDER — ESTRADIOL 0.1 MG/24HR TD PTTW: 1.0000 | MEDICATED_PATCH | TRANSDERMAL | 1 refills | Status: DC

## 2020-04-15 NOTE — Telephone Encounter (Signed)
[name], Kristen "Lesia"  P [facility] My apology for requesting another change to my estrogen patch but these weekly patches do not stay on more than 2 days at a time plus the adhesive is beginning to cause irritation. I have tried to secure them with paper tape and my skin just gets irritated and itchy. I can certainly tell my mood is much better with them. Rarely having hot flashes anymore. Thank you so much for helping with those!!! I have spoken with [facility] pharmacy and inquired about another brand of patch other than Mylan. [name] said she can order another brand but this requires a new prescription. I would like to go back to twice weekly to see if I can tolerate the adhesive better if it is a different brand. She stated to ask you to send a prescription for 1 month supply so I can see if it works or not. Hopefully this message makes sense. If not, please call me at [phone].    Call placed to patient to confirm Rx she is requesting, estradiol (Vivelle-Dot) 0.1 mg patch twice wkly. #8/0RF to [facility].   Advised I will review request with Dr. Hyacinth Meeker and notify once Rx has been sent or any additional recommendations.   Rx pended.   Routing to Dr. Hyacinth Meeker.

## 2020-04-15 NOTE — Telephone Encounter (Signed)
Order signed with RF placed.  Please let pt know.  Thanks.

## 2020-04-15 NOTE — Telephone Encounter (Signed)
See telephone encounter dated 04/15/20.   Encounter closed.  

## 2020-04-15 NOTE — Telephone Encounter (Signed)
Patient notified

## 2020-04-22 ENCOUNTER — Ambulatory Visit: Payer: No Typology Code available for payment source | Attending: Internal Medicine

## 2020-04-22 DIAGNOSIS — Z23 Encounter for immunization: Secondary | ICD-10-CM

## 2020-04-22 NOTE — Progress Notes (Signed)
   Covid-19 Vaccination Clinic  Name:  Sharisse Rantz    MRN: [id number] DOB: 05/06/1971  04/22/2020  [name] was observed post Covid-19 immunization for 15 minutes without incident. She was provided with Vaccine Information Sheet and instruction to access the V-Safe system.   [name] was instructed to call 911 with any severe reactions post vaccine: Marland Kitchen Difficulty breathing  . Swelling of face and throat  . A fast heartbeat  . A bad rash all over body  . Dizziness and weakness   Immunizations Administered    Name Date Dose VIS Date Route   Pfizer COVID-19 Vaccine 04/22/2020  8:38 AM 0.3 mL 09/30/2018 Intramuscular   Manufacturer: [employer], [employer]   Lot: [id number]   NDC: [address]-3

## 2020-05-10 NOTE — Progress Notes (Deleted)
49 y.o. [id number] Married [place] or [place] American female here for annual exam.    Patient's last menstrual period was 01/27/2017 (exact date).          Sexually active: {yes no:314532}  The current method of family planning is status post hysterectomy.    Exercising: {yes no:314532}  {types:19826} Smoker:  {YES NO:22349}  Health Maintenance: Pap:  01-08-17 neg HPV HR neg History of abnormal Pap:  yes MMG:   03-14-2020 category b density birads 1:neg Colonoscopy:  02-08-17 f/u 73yrs BMD:   none TDaP:  2016 Pneumonia vaccine(s):  *** Shingrix:   *** Hep C testing: *** Screening Labs: ***   reports that she has never smoked. She has never used smokeless tobacco. She reports that she does not drink alcohol and does not use drugs.  Past Medical History:  Diagnosis Date  . Abnormal Pap smear of cervix    dysplasia   . Acute sinusitis 05/26/2018  . Anemia    history of  . Anxiety   . Asthma    seasonal asthma, has not required use of inhaler in over 2 years  . Cervical dysplasia 1993  . Depression   . Diverticulitis   . Dysmenorrhea   . Essential hypertension   . GERD (gastroesophageal reflux disease)   . Headache    history of migraines  . IBS (irritable bowel syndrome)   . STD (sexually transmitted disease) ~1998   chlamydia     Past Surgical History:  Procedure Laterality Date  . ABDOMINAL HYSTERECTOMY    . ADENOIDECTOMY     lymph nodes removed  . APPENDECTOMY  1990  . COLONOSCOPY    . COLPOSCOPY  1992  . CYSTOSCOPY N/A 02/12/2017   Procedure: CYSTOSCOPY;  Surgeon: Jerene Bears, MD;  Location: [facility];  Service: Gynecology;  Laterality: N/A;  . TONSILLECTOMY    . TOTAL LAPAROSCOPIC HYSTERECTOMY WITH SALPINGECTOMY Bilateral 02/12/2017   Procedure: HYSTERECTOMY TOTAL LAPAROSCOPIC WITH SALPINGECTOMY;  Surgeon: Jerene Bears, MD;  Location: [facility];  Service: Gynecology;  Laterality: Bilateral;  . TUBAL LIGATION      Current Outpatient Medications  Medication Sig  Dispense Refill  . albuterol (PROVENTIL HFA;VENTOLIN HFA) 108 (90 Base) MCG/ACT inhaler Inhale 2 puffs into the lungs every 4 (four) hours as needed. 1 Inhaler 0  . amLODipine (NORVASC) 5 MG tablet Take 1 tablet (5 mg total) by mouth daily. For blood pressure. 30 tablet 0  . estradiol (VIVELLE-DOT) 0.1 MG/24HR patch Place 1 patch (0.1 mg total) onto the skin 2 (two) times a week. 8 patch 1  . hydrochlorothiazide (HYDRODIURIL) 25 MG tablet Take 1 tablet (25 mg total) by mouth daily. 90 tablet 3  . pantoprazole (PROTONIX) 40 MG tablet Take 1 tablet (40 mg total) by mouth daily. 90 tablet 1  . temazepam (RESTORIL) 15 MG capsule Take 1 capsule (15 mg total) by mouth at bedtime as needed for sleep. 30 capsule 0   No current facility-administered medications for this visit.    Family History  Problem Relation Age of Onset  . Hypertension Mother   . Depression Mother   . Diabetes Maternal Grandmother   . Hypertension Maternal Grandmother   . Colon cancer Father 57  . Diabetes Brother   . Stomach cancer Neg Hx   . Breast cancer Neg Hx     Review of Systems  Exam:   LMP 01/27/2017 (Exact Date)      General appearance: alert, cooperative and appears stated age Head:  Normocephalic, without obvious abnormality, atraumatic Neck: no adenopathy, supple, symmetrical, trachea midline and thyroid {EXAM; THYROID:18604} Lungs: clear to auscultation bilaterally Breasts: {Exam; breast:13139::"normal appearance, no masses or tenderness"} Heart: regular rate and rhythm Abdomen: soft, non-tender; bowel sounds normal; no masses,  no organomegaly Extremities: extremities normal, atraumatic, no cyanosis or edema Skin: Skin color, texture, turgor normal. No rashes or lesions Lymph nodes: Cervical, supraclavicular, and axillary nodes normal. No abnormal inguinal nodes palpated Neurologic: Grossly normal   Pelvic: External genitalia:  no lesions              Urethra:  normal appearing urethra with no  masses, tenderness or lesions              Bartholins and Skenes: normal                 Vagina: normal appearing vagina with normal color and discharge, no lesions              Cervix: {exam; cervix:14595}              Pap taken: {yes no:314532} Bimanual Exam:  Uterus:  {exam; uterus:12215}              Adnexa: {exam; adnexa:12223}               Rectovaginal: Confirms               Anus:  normal sphincter tone, no lesions  Chaperone, ***Zenovia Jordan, CMA, was present for exam.  A:  Well Woman with normal exam  P:   {plan; gyn:5269::"mammogram","pap smear","return annually or prn"}

## 2020-05-19 ENCOUNTER — Encounter: Payer: Self-pay | Admitting: Obstetrics & Gynecology

## 2020-05-19 ENCOUNTER — Ambulatory Visit: Payer: No Typology Code available for payment source | Admitting: Obstetrics & Gynecology

## 2020-09-13 DIAGNOSIS — U071 COVID-19: Secondary | ICD-10-CM

## 2020-09-13 HISTORY — DX: COVID-19: U07.1

## 2020-09-30 ENCOUNTER — Encounter: Payer: Self-pay | Admitting: Primary Care

## 2020-09-30 ENCOUNTER — Other Ambulatory Visit: Payer: Self-pay

## 2020-09-30 ENCOUNTER — Ambulatory Visit: Payer: Self-pay | Admitting: Primary Care

## 2020-09-30 VITALS — BP 136/90 | HR 82 | Temp 96.9°F | Ht 61.5 in | Wt 207.5 lb

## 2020-09-30 DIAGNOSIS — J101 Influenza due to other identified influenza virus with other respiratory manifestations: Secondary | ICD-10-CM

## 2020-09-30 DIAGNOSIS — G4700 Insomnia, unspecified: Secondary | ICD-10-CM

## 2020-09-30 DIAGNOSIS — U071 COVID-19: Secondary | ICD-10-CM

## 2020-09-30 DIAGNOSIS — R5383 Other fatigue: Secondary | ICD-10-CM

## 2020-09-30 DIAGNOSIS — I10 Essential (primary) hypertension: Secondary | ICD-10-CM

## 2020-09-30 DIAGNOSIS — K219 Gastro-esophageal reflux disease without esophagitis: Secondary | ICD-10-CM

## 2020-09-30 MED ORDER — ALBUTEROL SULFATE HFA 108 (90 BASE) MCG/ACT IN AERS: 2.0000 | INHALATION_SPRAY | RESPIRATORY_TRACT | 0 refills | Status: AC | PRN

## 2020-09-30 MED ORDER — AMLODIPINE BESYLATE 5 MG PO TABS: 5.0000 mg | ORAL_TABLET | Freq: Every day | ORAL | 0 refills | Status: DC

## 2020-09-30 MED ORDER — HYDROCHLOROTHIAZIDE 25 MG PO TABS: 25.0000 mg | ORAL_TABLET | Freq: Every day | ORAL | 0 refills | Status: DC

## 2020-09-30 MED ORDER — PANTOPRAZOLE SODIUM 40 MG PO TBEC: 40.0000 mg | DELAYED_RELEASE_TABLET | Freq: Every day | ORAL | 0 refills | Status: DC

## 2020-09-30 MED ORDER — TEMAZEPAM 15 MG PO CAPS: 15.0000 mg | ORAL_CAPSULE | Freq: Every evening | ORAL | 0 refills | Status: DC | PRN

## 2020-09-30 NOTE — Assessment & Plan Note (Signed)
Residual fatigue since COVID-19 infection, encouraged to increase endurance as tolerated.

## 2020-09-30 NOTE — Patient Instructions (Signed)
You are cleared to return to work effective immediately.  Please schedule your physical for the summer as discussed.  It was a pleasure to see you today!

## 2020-09-30 NOTE — Assessment & Plan Note (Signed)
Doing well on pantoprazole 40 mg daily, continue same.  Refill sent to pharmacy.

## 2020-09-30 NOTE — Assessment & Plan Note (Signed)
Using temazepam 15 mg sparingly, refill sent to pharmacy.

## 2020-09-30 NOTE — Progress Notes (Signed)
Subjective:    Patient ID: Kristen [name], female    DOB: March 16, 1971, 50 y.o.   MRN: [id number]  HPI  This visit occurred during the SARS-CoV-2 public health emergency.  Safety protocols were in place, including screening questions prior to the visit, additional usage of staff PPE, and extensive cleaning of exam room while observing appropriate contact time as indicated for disinfecting solutions.   Kristen [name] is a 50 year old female who presents today for follow up and medical release to return to work.  She is also needing medication refills.   Diagnosed with Covid-19 on 09/13/20, working at [employer] in [place]. 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  She believes that she did not have a properly fitting mask at the time.  Symptoms at the time included cough, SOB, fatigue, loss of smell, fevers, chills, body aches, nausea.  As of now all symptoms have resolved except for residual fatigue.  She is needing a work note to clear her to return to work.  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  Review of Systems  Constitutional: Positive for fatigue. Negative for fever.  HENT: Negative for congestion and sore throat.   Respiratory: Negative for cough and shortness of breath.   Cardiovascular: Negative for chest pain.  Gastrointestinal: Negative for nausea.  Neurological: Negative for headaches.       Past Medical History:  Diagnosis Date  . Abnormal Pap smear of cervix    dysplasia   . Acute sinusitis 05/26/2018  . Anemia    history of  . Anxiety   . Asthma    seasonal asthma, has not required use of inhaler in over 2 years  . Cervical dysplasia 1993  . COVID-19 09/13/2020  . Depression   . Diverticulitis   . Dysmenorrhea   . Essential hypertension   . GERD (gastroesophageal reflux disease)   . Headache    history of migraines  . IBS (irritable bowel syndrome)   . STD (sexually transmitted  disease) ~1998   chlamydia      Social History   Socioeconomic History  . Marital status: Married    Spouse name: Not on file  . Number of children: 3  . Years of education: 18  . Highest education level: Not on file  Occupational History    Comment: [employer], Health Coach  Tobacco Use  . Smoking status: Never Smoker  . Smokeless tobacco: Never Used  Vaping Use  . Vaping Use: Never used  Substance and Sexual Activity  . Alcohol use: No  . Drug use: No  . Sexual activity: Not Currently    Partners: Male    Birth control/protection: Surgical  Other Topics Concern  . Not on file  Social History Narrative   Separated.    3 children.    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    Social Determinants of Health   Financial Resource Strain: Not on file  Food Insecurity: Not on file  Transportation Needs: Not on file  Physical Activity: Not on file  Stress: Not on file  Social Connections: Not on file  Intimate Partner Violence: Not on file    Past Surgical History:  Procedure Laterality Date  . ABDOMINAL HYSTERECTOMY    . ADENOIDECTOMY     lymph nodes removed  . APPENDECTOMY  1990  . COLONOSCOPY    .  COLPOSCOPY  1992  . CYSTOSCOPY N/A 02/12/2017   Procedure: CYSTOSCOPY;  Surgeon: Jerene Bears, MD;  Location: [facility];  Service: Gynecology;  Laterality: N/A;  . TONSILLECTOMY    . TOTAL LAPAROSCOPIC HYSTERECTOMY WITH SALPINGECTOMY Bilateral 02/12/2017   Procedure: HYSTERECTOMY TOTAL LAPAROSCOPIC WITH SALPINGECTOMY;  Surgeon: Jerene Bears, MD;  Location: [facility];  Service: Gynecology;  Laterality: Bilateral;  . TUBAL LIGATION      Family History  Problem Relation Age of Onset  . Hypertension Mother   . Depression Mother   . Diabetes Maternal Grandmother   . Hypertension Maternal Grandmother   . Colon cancer Father 65  . Diabetes Brother   . Stomach cancer Neg Hx   . Breast cancer Neg Hx     Allergies   Allergen Reactions  . Amoxicillin Nausea Only    Has patient had a PCN reaction causing immediate rash, facial/tongue/throat swelling, SOB or lightheadedness with hypotension: No Has patient had a PCN reaction causing severe rash involving mucus membranes or skin necrosis: No Has patient had a PCN reaction that required hospitalization: No Has patient had a PCN reaction occurring within the last 10 years: Yes If all of the above answers are "NO", then may proceed with Cephalosporin use.   Marland Kitchen Doxycycline   . Penicillin G Rash    Has patient had a PCN reaction causing immediate rash, facial/tongue/throat swelling, SOB or lightheadedness with hypotension: Unknown Has patient had a PCN reaction causing severe rash involving mucus membranes or skin necrosis: Unknown Has patient had a PCN reaction that required hospitalization: Unknown Has patient had a PCN reaction occurring within the last 10 years: No If all of the above answers are "NO", then may proceed with Cephalosporin use.     Current Outpatient Medications on File Prior to Visit  Medication Sig Dispense Refill  . estradiol (VIVELLE-DOT) 0.1 MG/24HR patch Place 1 patch (0.1 mg total) onto the skin 2 (two) times a week. 8 patch 1   No current facility-administered medications on file prior to visit.    BP 136/90   Pulse 82   Temp (!) 96.9 F (36.1 C) (Temporal)   Ht 5' 1.5" (1.562 m)   Wt 207 lb 8 oz (94.1 kg)   LMP 01/27/2017 (Exact Date)   SpO2 99%   BMI 38.57 kg/m    Objective:   Physical Exam Constitutional:      Appearance: She is well-nourished.  Cardiovascular:     Rate and Rhythm: Normal rate and regular rhythm.  Pulmonary:     Effort: Pulmonary effort is normal.     Breath sounds: Normal breath sounds.  Musculoskeletal:     Cervical back: Neck supple.  Skin:    General: Skin is warm and dry.  Psychiatric:        Mood and Affect: Mood and affect normal.            Assessment & Plan:

## 2020-09-30 NOTE — Assessment & Plan Note (Signed)
Above goal today, patient endorses normal readings at home outside of Covid, is improving gradually.   Continue amlodipine 5 mg and HCTZ 25 mg daily.  Labs from July 2021 reviewed. She will return this Summer for CPE.

## 2020-09-30 NOTE — Assessment & Plan Note (Addendum)
Recent illness with Covid-19, has completed quarantine, most symptoms have resolved except for residual fatigue.  Recommended Covid-19 Booster once able.  She is cleared to return to work, work note provided today

## 2020-10-17 ENCOUNTER — Emergency Department
Admission: EM | Admit: 2020-10-17 | Discharge: 2020-10-17 | Disposition: A | Discharge status: Home / Self Care | Payer: Self-pay | Attending: Emergency Medicine | Admitting: Emergency Medicine

## 2020-10-17 ENCOUNTER — Emergency Department: Payer: Self-pay

## 2020-10-17 ENCOUNTER — Other Ambulatory Visit: Payer: Self-pay

## 2020-10-17 DIAGNOSIS — S76811A Strain of other specified muscles, fascia and tendons at thigh level, right thigh, initial encounter: Secondary | ICD-10-CM | POA: Insufficient documentation

## 2020-10-17 DIAGNOSIS — Y93B9 Activity, other involving muscle strengthening exercises: Secondary | ICD-10-CM | POA: Insufficient documentation

## 2020-10-17 DIAGNOSIS — M79661 Pain in right lower leg: Secondary | ICD-10-CM | POA: Insufficient documentation

## 2020-10-17 DIAGNOSIS — Z79899 Other long term (current) drug therapy: Secondary | ICD-10-CM | POA: Insufficient documentation

## 2020-10-17 DIAGNOSIS — I10 Essential (primary) hypertension: Secondary | ICD-10-CM | POA: Insufficient documentation

## 2020-10-17 DIAGNOSIS — J45909 Unspecified asthma, uncomplicated: Secondary | ICD-10-CM | POA: Insufficient documentation

## 2020-10-17 DIAGNOSIS — X58XXXA Exposure to other specified factors, initial encounter: Secondary | ICD-10-CM | POA: Insufficient documentation

## 2020-10-17 DIAGNOSIS — S76211A Strain of adductor muscle, fascia and tendon of right thigh, initial encounter: Secondary | ICD-10-CM

## 2020-10-17 DIAGNOSIS — Z8616 Personal history of COVID-19: Secondary | ICD-10-CM | POA: Insufficient documentation

## 2020-10-17 MED ORDER — ORPHENADRINE CITRATE ER 100 MG PO TB12
100.0000 mg | ORAL_TABLET | Freq: Two times a day (BID) | ORAL | 0 refills | Status: DC
Start: 2020-10-17 — End: 2021-08-30

## 2020-10-17 MED ORDER — KETOROLAC TROMETHAMINE 10 MG PO TABS
10.0000 mg | ORAL_TABLET | Freq: Four times a day (QID) | ORAL | 0 refills | Status: DC | PRN
Start: 2020-10-17 — End: 2021-08-30

## 2020-10-17 NOTE — ED Provider Notes (Signed)
[facility] Emergency Department Provider Note   ____________________________________________   None    (approximate)  I have reviewed the triage vital signs and the nursing notes.   HISTORY  Chief Complaint Leg Pain    HPI Kristen [name] is a 50 y.o. female patient complain of right groin and right lower leg pain for 5 days.  Patient initially thought it was a muscle strain secondary to increased physical training.  Patient stated pain originated right medial thigh and presents with swelling.  Patient had pain is radiated down to the mid lower leg.  Patient was concerned secondary to being diagnosed with COVID-19 1 month ago.  Rates the pain as a 7/10.  Described pain as "sore".  Denies dyspnea or chest pain.      Past Medical History:  Diagnosis Date  . Abnormal Pap smear of cervix    dysplasia   . Acute sinusitis 05/26/2018  . Anemia    history of  . Anxiety   . Asthma    seasonal asthma, has not required use of inhaler in over 2 years  . Cervical dysplasia 1993  . COVID-19 09/13/2020  . Depression   . Diverticulitis   . Dysmenorrhea   . Essential hypertension   . GERD (gastroesophageal reflux disease)   . Headache    history of migraines  . IBS (irritable bowel syndrome)   . STD (sexually transmitted disease) ~1998   chlamydia     Patient Active Problem List   Diagnosis Date Noted  . COVID-19 virus infection 09/30/2020  . Goiter 02/29/2020  . Vitamin D deficiency 02/29/2020  . Chronic right hip pain 05/30/2018  . Abnormal facial hair 05/30/2018  . GAD (generalized anxiety disorder) 04/25/2018  . Chest pain 09/04/2017  . Hot flashes 05/28/2017  . Obesity (BMI 30-39.9) 09/03/2016  . Intracranial hypertension, benign 06/22/2016  . Visual disturbance 05/18/2016  . Other fatigue 03/01/2016  . Preventative health care 01/03/2016  . Insomnia 11/29/2015  . Essential hypertension 11/11/2015  . GERD (gastroesophageal reflux  disease) 11/11/2015    Past Surgical History:  Procedure Laterality Date  . ABDOMINAL HYSTERECTOMY    . ADENOIDECTOMY     lymph nodes removed  . APPENDECTOMY  1990  . COLONOSCOPY    . COLPOSCOPY  1992  . CYSTOSCOPY N/A 02/12/2017   Procedure: CYSTOSCOPY;  Surgeon: Jerene Bears, MD;  Location: [facility];  Service: Gynecology;  Laterality: N/A;  . TONSILLECTOMY    . TOTAL LAPAROSCOPIC HYSTERECTOMY WITH SALPINGECTOMY Bilateral 02/12/2017   Procedure: HYSTERECTOMY TOTAL LAPAROSCOPIC WITH SALPINGECTOMY;  Surgeon: Jerene Bears, MD;  Location: [facility];  Service: Gynecology;  Laterality: Bilateral;  . TUBAL LIGATION      Prior to Admission medications   Medication Sig Start Date End Date Taking? Authorizing Provider  ketorolac (TORADOL) 10 MG tablet Take 1 tablet (10 mg total) by mouth every 6 (six) hours as needed. 10/17/20  Yes Joni Reining, PA-C  orphenadrine (NORFLEX) 100 MG tablet Take 1 tablet (100 mg total) by mouth 2 (two) times daily. 10/17/20  Yes Joni Reining, PA-C  albuterol (VENTOLIN HFA) 108 (90 Base) MCG/ACT inhaler Inhale 2 puffs into the lungs every 4 (four) hours as needed. 09/30/20   Doreene Nest, NP  amLODipine (NORVASC) 5 MG tablet Take 1 tablet (5 mg total) by mouth daily. For blood pressure. 09/30/20   Doreene Nest, NP  estradiol (VIVELLE-DOT) 0.1 MG/24HR patch Place 1 patch (0.1 mg total) onto the  skin 2 (two) times a week. 04/18/20   Jerene Bears, MD  hydrochlorothiazide (HYDRODIURIL) 25 MG tablet Take 1 tablet (25 mg total) by mouth daily. For blood pressure. 09/30/20 12/29/20  Doreene Nest, NP  pantoprazole (PROTONIX) 40 MG tablet Take 1 tablet (40 mg total) by mouth daily. For heartburn. 09/30/20   Doreene Nest, NP  temazepam (RESTORIL) 15 MG capsule Take 1 capsule (15 mg total) by mouth at bedtime as needed for sleep. 09/30/20   Doreene Nest, NP    Allergies Amoxicillin, Doxycycline, and Penicillin g  Family History  Problem Relation  Age of Onset  . Hypertension Mother   . Depression Mother   . Diabetes Maternal Grandmother   . Hypertension Maternal Grandmother   . Colon cancer Father 23  . Diabetes Brother   . Stomach cancer Neg Hx   . Breast cancer Neg Hx     Social History Social History   Tobacco Use  . Smoking status: Never Smoker  . Smokeless tobacco: Never Used  Vaping Use  . Vaping Use: Never used  Substance Use Topics  . Alcohol use: No  . Drug use: No    Review of Systems Constitutional: No fever/chills Eyes: No visual changes. ENT: No sore throat. Cardiovascular: Denies chest pain. Respiratory: Denies shortness of breath. Gastrointestinal: No abdominal pain.  No nausea, no vomiting.  No diarrhea.  No constipation. Genitourinary: Negative for dysuria. Musculoskeletal: Negative for back pain. Skin: Negative for rash. Neurological: Negative for headaches, focal weakness or numbness. Psychiatric:  Anxiety and depression. Endocrine:  Hypertension. Allergic/Immunilogical: Penicillin and doxycycline. ____________________________________________   PHYSICAL EXAM:  VITAL SIGNS: ED Triage Vitals  Enc Vitals Group     BP 10/17/20 0743 (!) 165/117     Pulse Rate 10/17/20 0743 71     Resp 10/17/20 0743 18     Temp 10/17/20 0743 98.3 F (36.8 C)     Temp Source 10/17/20 0743 Oral     SpO2 10/17/20 0743 98 %     Weight 10/17/20 0743 206 lb (93.4 kg)     Height 10/17/20 0743 5\' 1"  (1.549 m)     Head Circumference --      Peak Flow --      Pain Score 10/17/20 0746 7     Pain Loc --      Pain Edu? --      Excl. in GC? --    Constitutional: Alert and oriented. Well appearing and in no acute distress.  BMI is 30.92. Cardiovascular: Normal rate, regular rhythm. Grossly normal heart sounds.  Good peripheral circulation.  Elevated blood pressure.  Patient has not taken her morning medications for hypertension. Respiratory: Normal respiratory effort.  No retractions. Lungs  CTAB. Gastrointestinal: Soft and nontender. No distention. No abdominal bruits. No CVA tenderness. Genitourinary: Deferred Musculoskeletal: No lower extremity tenderness nor edema.  No joint effusions. Neurologic:  Normal speech and language. No gross focal neurologic deficits are appreciated. No gait instability. Skin:  Skin is warm, dry and intact. No rash noted.  No obvious edema, erythema, or ecchymosis. Psychiatric: Mood and affect are normal. Speech and behavior are normal.  ____________________________________________   LABS (all labs ordered are listed, but only abnormal results are displayed)  Labs Reviewed - No data to display ____________________________________________  EKG   ____________________________________________  RADIOLOGY I, 10/19/20, personally viewed and evaluated these images (plain radiographs) as part of my medical decision making, as well as reviewing the written report by  the radiologist.  ED MD interpretation:    Official radiology report(s): US Venous Img Lower Unilateral Right  Result Date: 10/17/2020 CLINICAL DATA:  Right groin and lower leg pain for 5 days. EXAM: RIGHT LOWER EXTREMITY VENOUS DOPPLER ULTRASOUND TECHNIQUE: Gray-scale sonography with compression, as well as color and duplex ultrasound, were performed to evaluate the deep venous system(s) from the level of the common femoral vein through the popliteal and proximal calf veins. COMPARISON:  None. FINDINGS: VENOUS Normal compressibility of the common femoral, superficial femoral, and popliteal veins, as well as the visualized calf veins. Visualized portions of profunda femoral vein and great saphenous vein unremarkable. No filling defects to suggest DVT on grayscale or color Doppler imaging. Doppler waveforms show normal direction of venous flow, normal respiratory plasticity and response to augmentation. Limited views of the contralateral common femoral vein are unremarkable. OTHER None.  Limitations: None. IMPRESSION: Negative examination.  No DVT. Electronically Signed   By: Drusilla Kanner M.D.   On: 10/17/2020 09:25    ____________________________________________   PROCEDURES  Procedure(s) performed (including Critical Care):  Procedures   ____________________________________________   INITIAL IMPRESSION / ASSESSMENT AND PLAN / ED COURSE  As part of my medical decision making, I reviewed the following data within the electronic MEDICAL RECORD NUMBER         Patient presents for right inguinal pain secondary to increased physical training last week.  Patient was concerned for DVT secondary to recent history of COVID-19.  Discussed no DVT on ultrasound.  Patient complaining physical exam consistent with a right inguinal strain.  Patient given discharge care instruction advised take medication as directed.  [facility]      ____________________________________________   FINAL CLINICAL IMPRESSION(S) / ED DIAGNOSES  Final diagnoses:  Inguinal strain, right, initial encounter     ED Discharge Orders         Ordered    orphenadrine (NORFLEX) 100 MG tablet  2 times daily        10/17/20 0932    ketorolac (TORADOL) 10 MG tablet  Every 6 hours PRN        10/17/20 0932          *Please note:  Kristen [name] was evaluated in Emergency Department on 10/17/2020 for the symptoms described in the history of present illness. She was evaluated in the context of the global COVID-19 pandemic, which necessitated consideration that the patient might be at risk for infection with the SARS-CoV-2 virus that causes COVID-19. Institutional protocols and algorithms that pertain to the evaluation of patients at risk for COVID-19 are in a state of rapid change based on information released by regulatory bodies including the CDC and federal and state organizations. These policies and algorithms were followed during the patient's care in the  ED.  Some ED evaluations and interventions may be delayed as a result of limited staffing during and the pandemic.*   Note:  This document was prepared using Dragon voice recognition software and may include unintentional dictation errors.    Joni Reining, PA-C 10/17/20 0935    Delton [place], MD 10/19/20 [phone]

## 2020-10-17 NOTE — ED Triage Notes (Signed)
Pt c/o having right groin pain since Wednesday after working out and thought it was a muscle strain, states since then the pain has traveled to the lateral thigh with swelling , states she is is concerned about a DVT due to having covid a month ago.

## 2020-10-17 NOTE — ED Notes (Signed)
See triage note  Presents with pain and swelling to right lower leg  Pain and and swelling is lateral aspect of her leg  States she had COVID 2/7  States she still has some intermittent fatigue and SOB  resp even and non labored on arrival

## 2020-10-17 NOTE — Discharge Instructions (Addendum)
Your ultrasound was negative for DVT.  Follow discharge care instruction take medication as directed.

## 2021-05-14 ENCOUNTER — Other Ambulatory Visit: Payer: Self-pay | Admitting: Obstetrics & Gynecology

## 2021-05-15 ENCOUNTER — Other Ambulatory Visit: Payer: Self-pay | Admitting: Obstetrics & Gynecology

## 2021-05-15 ENCOUNTER — Other Ambulatory Visit: Payer: Self-pay

## 2021-05-16 ENCOUNTER — Other Ambulatory Visit: Payer: Self-pay

## 2021-05-16 NOTE — Telephone Encounter (Signed)
LMOVM for pt to make appt before refill can be sent

## 2021-05-18 ENCOUNTER — Other Ambulatory Visit: Payer: Self-pay

## 2021-05-23 ENCOUNTER — Other Ambulatory Visit: Payer: Self-pay

## 2021-05-25 ENCOUNTER — Other Ambulatory Visit: Payer: Self-pay

## 2021-05-26 ENCOUNTER — Other Ambulatory Visit: Payer: Self-pay

## 2021-05-29 ENCOUNTER — Other Ambulatory Visit: Payer: Self-pay

## 2021-05-31 ENCOUNTER — Other Ambulatory Visit: Payer: Self-pay | Admitting: Obstetrics & Gynecology

## 2021-05-31 ENCOUNTER — Other Ambulatory Visit: Payer: Self-pay

## 2021-05-31 NOTE — Telephone Encounter (Signed)
Pt advised to call the office for apt.  Pt no showed for apt 05/2020.  Pharmacy advised to have pt call the office to be seen. KW CMA/Dr Hyacinth Meeker

## 2021-05-31 NOTE — Telephone Encounter (Signed)
Pt hasnt called back.  Rx refused.  No apt.  Last apt 05/2020 no show.  Pharmacist informed to advise pt to call office. KW CMA

## 2021-06-28 ENCOUNTER — Other Ambulatory Visit: Payer: Self-pay

## 2021-06-28 ENCOUNTER — Telehealth: Payer: 59 | Admitting: Physician Assistant

## 2021-06-28 DIAGNOSIS — J020 Streptococcal pharyngitis: Secondary | ICD-10-CM | POA: Diagnosis not present

## 2021-06-28 MED ORDER — CEFDINIR 300 MG PO CAPS
300.0000 mg | ORAL_CAPSULE | Freq: Two times a day (BID) | ORAL | 0 refills | Status: DC
  Filled 2021-06-28: qty 14, 7d supply, fill #0

## 2021-06-28 NOTE — Progress Notes (Signed)
Virtual Visit Consent   Kristen [name], you are scheduled for a virtual visit with a [facility] provider today.     Just as with appointments in the office, your consent must be obtained to participate.  Your consent will be active for this visit and any virtual visit you may have with one of our providers in the next 365 days.     If you have a MyChart account, a copy of this consent can be sent to you electronically.  All virtual visits are billed to your insurance company just like a traditional visit in the office.    As this is a virtual visit, video technology does not allow for your provider to perform a traditional examination.  This may limit your provider's ability to fully assess your condition.  If your provider identifies any concerns that need to be evaluated in person or the need to arrange testing (such as labs, EKG, etc.), we will make arrangements to do so.     Although advances in technology are sophisticated, we cannot ensure that it will always work on either your end or our end.  If the connection with a video visit is poor, the visit may have to be switched to a telephone visit.  With either a video or telephone visit, we are not always able to ensure that we have a secure connection.     I need to obtain your verbal consent now.   Are you willing to proceed with your visit today?    [name] has provided verbal consent on 06/28/2021 for a virtual visit (video or telephone).   Mar Daring, PA-C   Date: 06/28/2021 1:21 PM   Virtual Visit via Video Note   I, Mar Daring, connected with  Kristen [name]  ([id number], 01-18-1971) on 06/28/21 at  1:15 PM EST by a video-enabled telemedicine application and verified that I am speaking with the correct person using two identifiers.  Location: Patient: Virtual Visit Location Patient: Home Provider: Virtual Visit Location Provider: Home Office   I discussed the limitations of evaluation and  management by telemedicine and the availability of in person appointments. The patient expressed understanding and agreed to proceed.    History of Present Illness: Kristen [name] is a 50 y.o. who identifies as a female who was assigned female at birth, and is being seen today for sore throat, low grade fever.  HPI: Sore Throat  This is a new problem. The current episode started in the past 7 days (Monday). The problem has been gradually worsening. The maximum temperature recorded prior to her arrival was 100.4 - 100.9 F. The fever has been present for Less than 1 day. The pain is moderate. Associated symptoms include congestion, coughing, headaches, a hoarse voice, swollen glands and trouble swallowing. Pertinent negatives include no ear discharge or ear pain. Associated symptoms comments: White patches on tonsils and posterior throat. Treatments tried: tylenol cold and flu. The treatment provided no relief.     Problems:  Patient Active Problem List   Diagnosis Date Noted   COVID-19 virus infection 09/30/2020   Goiter 02/29/2020   Vitamin D deficiency 02/29/2020   Chronic right hip pain 05/30/2018   Abnormal facial hair 05/30/2018   GAD (generalized anxiety disorder) 04/25/2018   Chest pain 09/04/2017   Hot flashes 05/28/2017   Obesity (BMI 30-39.9) 09/03/2016   Intracranial hypertension, benign 06/22/2016   Visual disturbance 05/18/2016   Other fatigue 03/01/2016   Preventative health care 01/03/2016  Insomnia 11/29/2015   Essential hypertension 11/11/2015   GERD (gastroesophageal reflux disease) 11/11/2015    Allergies:  Allergies  Allergen Reactions   Amoxicillin Nausea Only    Has patient had a PCN reaction causing immediate rash, facial/tongue/throat swelling, SOB or lightheadedness with hypotension: No Has patient had a PCN reaction causing severe rash involving mucus membranes or skin necrosis: No Has patient had a PCN reaction that required hospitalization: No Has  patient had a PCN reaction occurring within the last 10 years: Yes If all of the above answers are "NO", then may proceed with Cephalosporin use.    Doxycycline    Penicillin G Rash    Has patient had a PCN reaction causing immediate rash, facial/tongue/throat swelling, SOB or lightheadedness with hypotension: Unknown Has patient had a PCN reaction causing severe rash involving mucus membranes or skin necrosis: Unknown Has patient had a PCN reaction that required hospitalization: Unknown Has patient had a PCN reaction occurring within the last 10 years: No If all of the above answers are "NO", then may proceed with Cephalosporin use.    Medications:  Current Outpatient Medications:    cefdinir (OMNICEF) 300 MG capsule, Take 1 capsule (300 mg total) by mouth 2 (two) times daily., Disp: 14 capsule, Rfl: 0   albuterol (VENTOLIN HFA) 108 (90 Base) MCG/ACT inhaler, Inhale 2 puffs into the lungs every 4 (four) hours as needed., Disp: 1 each, Rfl: 0   amLODipine (NORVASC) 5 MG tablet, Take 1 tablet (5 mg total) by mouth daily. For blood pressure., Disp: 90 tablet, Rfl: 0   estradiol (VIVELLE-DOT) 0.1 MG/24HR patch, PLACE 1 PATCH ONTO THE SKIN 2 TIMES A WEEK. (USE NDC [phone]), Disp: 8 patch, Rfl: 1   hydrochlorothiazide (HYDRODIURIL) 25 MG tablet, Take 1 tablet (25 mg total) by mouth daily. For blood pressure., Disp: 90 tablet, Rfl: 0   ketorolac (TORADOL) 10 MG tablet, Take 1 tablet (10 mg total) by mouth every 6 (six) hours as needed., Disp: 20 tablet, Rfl: 0   orphenadrine (NORFLEX) 100 MG tablet, Take 1 tablet (100 mg total) by mouth 2 (two) times daily., Disp: 10 tablet, Rfl: 0   pantoprazole (PROTONIX) 40 MG tablet, Take 1 tablet (40 mg total) by mouth daily. For heartburn., Disp: 90 tablet, Rfl: 0   temazepam (RESTORIL) 15 MG capsule, Take 1 capsule (15 mg total) by mouth at bedtime as needed for sleep., Disp: 30 capsule, Rfl: 0  Observations/Objective: Patient is well-developed,  well-nourished in no acute distress.  Resting comfortably at home.  Head is normocephalic, atraumatic.  No labored breathing.  Speech is clear and coherent with logical content.  Patient is alert and oriented at baseline.    Assessment and Plan: 1. Strep pharyngitis - cefdinir (OMNICEF) 300 MG capsule; Take 1 capsule (300 mg total) by mouth 2 (two) times daily.  Dispense: 14 capsule; Refill: 0  - Symptoms consistent with strep - Omnicef prescribed - May continue symptomatic management of choice - Salt water gargles - Push fluids - Rest - Seek in person evaluation if worsening or fails to improve  Follow Up Instructions: I discussed the assessment and treatment plan with the patient. The patient was provided an opportunity to ask questions and all were answered. The patient agreed with the plan and demonstrated an understanding of the instructions.  A copy of instructions were sent to the patient via MyChart unless otherwise noted below.    The patient was advised to call back or seek an in-person evaluation if  the symptoms worsen or if the condition fails to improve as anticipated.  Time:  I spent 9 minutes with the patient via telehealth technology discussing the above problems/concerns.    Mar Daring, PA-C

## 2021-06-28 NOTE — Patient Instructions (Signed)
Kristen [name], thank you for joining Margaretann Loveless, PA-C for today's virtual visit.  While this provider is not your primary care provider (PCP), if your PCP is located in our provider database this encounter information will be shared with them immediately following your visit.  Consent: (Patient) Kristen [name] provided verbal consent for this virtual visit at the beginning of the encounter.  Current Medications:  Current Outpatient Medications:    cefdinir (OMNICEF) 300 MG capsule, Take 1 capsule (300 mg total) by mouth 2 (two) times daily., Disp: 14 capsule, Rfl: 0   albuterol (VENTOLIN HFA) 108 (90 Base) MCG/ACT inhaler, Inhale 2 puffs into the lungs every 4 (four) hours as needed., Disp: 1 each, Rfl: 0   amLODipine (NORVASC) 5 MG tablet, Take 1 tablet (5 mg total) by mouth daily. For blood pressure., Disp: 90 tablet, Rfl: 0   estradiol (VIVELLE-DOT) 0.1 MG/24HR patch, PLACE 1 PATCH ONTO THE SKIN 2 TIMES A WEEK. (USE NDC [phone]), Disp: 8 patch, Rfl: 1   hydrochlorothiazide (HYDRODIURIL) 25 MG tablet, Take 1 tablet (25 mg total) by mouth daily. For blood pressure., Disp: 90 tablet, Rfl: 0   ketorolac (TORADOL) 10 MG tablet, Take 1 tablet (10 mg total) by mouth every 6 (six) hours as needed., Disp: 20 tablet, Rfl: 0   orphenadrine (NORFLEX) 100 MG tablet, Take 1 tablet (100 mg total) by mouth 2 (two) times daily., Disp: 10 tablet, Rfl: 0   pantoprazole (PROTONIX) 40 MG tablet, Take 1 tablet (40 mg total) by mouth daily. For heartburn., Disp: 90 tablet, Rfl: 0   temazepam (RESTORIL) 15 MG capsule, Take 1 capsule (15 mg total) by mouth at bedtime as needed for sleep., Disp: 30 capsule, Rfl: 0   Medications ordered in this encounter:  Meds ordered this encounter  Medications   cefdinir (OMNICEF) 300 MG capsule    Sig: Take 1 capsule (300 mg total) by mouth 2 (two) times daily.    Dispense:  14 capsule    Refill:  0    Order Specific Question:   Supervising Provider     Answer:   Hyacinth Meeker, BRIAN [[id number]]     *If you need refills on other medications prior to your next appointment, please contact your pharmacy*  Follow-Up: Call back or seek an in-person evaluation if the symptoms worsen or if the condition fails to improve as anticipated.  Other Instructions Strep Throat, Adult Strep throat is an infection in the throat that is caused by bacteria. It is common during the cold months of the year. It mostly affects children who are 75-33 years old. However, people of all ages can get it at any time of the year. This infection spreads from person to person (is contagious) through coughing, sneezing, or having close contact. Your health care provider may use other names to describe the infection. When strep throat affects the tonsils, it is called tonsillitis. When it affects the back of the throat, it is called pharyngitis. What are the causes? This condition is caused by the Streptococcus pyogenes bacteria. What increases the risk? You are more likely to develop this condition if: You care for school-age children, or are around school-age children. Children are more likely to get strep throat and may spread it to others. You spend time in crowded places where the infection can spread easily. You have close contact with someone who has strep throat. What are the signs or symptoms? Symptoms of this condition include: Fever or chills. Redness, swelling, or pain in  the tonsils or throat. Pain or difficulty when swallowing. White or yellow spots on the tonsils or throat. Tender glands in the neck and under the jaw. Bad smelling breath. Red rash all over the body. This is rare. How is this diagnosed? This condition is diagnosed by tests that check for the presence and the amount of bacteria that cause strep throat. They are: Rapid strep test. Your throat is swabbed and checked for the presence of bacteria. Results are usually ready in minutes. Throat culture test.  Your throat is swabbed. The sample is placed in a cup that allows infections to grow. Results are usually ready in 1 or 2 days. How is this treated? This condition may be treated with: Medicines that kill germs (antibiotics). Medicines that relieve pain or fever. These include: Ibuprofen or acetaminophen. Aspirin, only for people who are over the age of 24. Throat lozenges. Throat sprays. Follow these instructions at home: Medicines  Take over-the-counter and prescription medicines only as told by your health care provider. Take your antibiotic medicine as told by your health care provider. Do not stop taking the antibiotic even if you start to feel better. Eating and drinking  If you have trouble swallowing, try eating soft foods until your sore throat feels better. Drink enough fluid to keep your urine pale yellow. To help relieve pain, you may have: Warm fluids, such as soup and tea. Cold fluids, such as frozen desserts or popsicles. General instructions Gargle with a salt-water mixture 3-4 times a day or as needed. To make a salt-water mixture, completely dissolve -1 tsp (3-6 g) of salt in 1 cup (237 mL) of warm water. Get plenty of rest. Stay home from work or school until you have been taking antibiotics for 24 hours. Do not use any products that contain nicotine or tobacco. These products include cigarettes, chewing tobacco, and vaping devices, such as e-cigarettes. If you need help quitting, ask your health care provider. It is up to you to get your test results. Ask your health care provider, or the department that is doing the test, when your results will be ready. Keep all follow-up visits. This is important. How is this prevented?  Do not share food, drinking cups, or personal items that could cause the infection to spread to other people. Wash your hands often with soap and water for at least 20 seconds. If soap and water are not available, use hand sanitizer. Make sure  that all people in your house wash their hands well. Have family members tested if they have a sore throat or fever. They may need an antibiotic if they have strep throat. Contact a health care provider if: You have swelling in your neck that keeps getting bigger. You develop a rash, cough, or earache. You cough up a thick mucus that is green, yellow-brown, or bloody. You have pain or discomfort that does not get better with medicine. Your symptoms seem to be getting worse. You have a fever. Get help right away if: You have new symptoms, such as vomiting, severe headache, stiff or painful neck, chest pain, or shortness of breath. You have severe throat pain, drooling, or changes in your voice. You have swelling of the neck, or the skin on the neck becomes red and tender. You have signs of dehydration, such as tiredness (fatigue), dry mouth, and decreased urination. You become increasingly sleepy, or you cannot wake up completely. Your joints become red or painful. These symptoms may represent a serious problem that  is an emergency. Do not wait to see if the symptoms will go away. Get medical help right away. Call your local emergency services (911 in the [place] Do not drive yourself to the hospital. Summary Strep throat is an infection in the throat that is caused by the Streptococcus pyogenes bacteria. This infection is spread from person to person (is contagious) through coughing, sneezing, or having close contact. Take your medicines, including antibiotics, as told by your health care provider. Do not stop taking the antibiotic even if you start to feel better. To prevent the spread of germs, wash your hands well with soap and water. Have others do the same. Do not share food, drinking cups, or personal items. Get help right away if you have new symptoms, such as vomiting, severe headache, stiff or painful neck, chest pain, or shortness of breath. This information is not intended to replace  advice given to you by your health care provider. Make sure you discuss any questions you have with your health care provider. Document Revised: 11/15/2020 Document Reviewed: 11/15/2020 Elsevier Patient Education  2022 [employer].    If you have been instructed to have an in-person evaluation today at a local Urgent Care facility, please use the link below. It will take you to a list of all of our available [facility], including address, phone number and hours of operation. Please do not delay care.  [facility]  If you or a family member do not have a primary care provider, use the link below to schedule a visit and establish care. When you choose a [facility] primary care physician or advanced practice provider, you gain a long-term partner in health. Find a Primary Care Provider  Learn more about [facility]'s in-office and virtual care options:  - Get Care Now

## 2021-08-17 ENCOUNTER — Other Ambulatory Visit: Payer: Self-pay | Admitting: Primary Care

## 2021-08-17 DIAGNOSIS — Z1231 Encounter for screening mammogram for malignant neoplasm of breast: Secondary | ICD-10-CM

## 2021-08-30 ENCOUNTER — Encounter: Payer: Self-pay | Admitting: Internal Medicine

## 2021-08-30 ENCOUNTER — Ambulatory Visit: Payer: 59 | Admitting: Internal Medicine

## 2021-08-30 ENCOUNTER — Other Ambulatory Visit: Payer: Self-pay

## 2021-08-30 VITALS — BP 138/100 | HR 76 | Ht 61.0 in | Wt 214.0 lb

## 2021-08-30 DIAGNOSIS — Z800 Family history of malignant neoplasm of digestive organs: Secondary | ICD-10-CM

## 2021-08-30 DIAGNOSIS — R152 Fecal urgency: Secondary | ICD-10-CM

## 2021-08-30 DIAGNOSIS — K219 Gastro-esophageal reflux disease without esophagitis: Secondary | ICD-10-CM

## 2021-08-30 DIAGNOSIS — R109 Unspecified abdominal pain: Secondary | ICD-10-CM

## 2021-08-30 MED ORDER — NA SULFATE-K SULFATE-MG SULF 17.5-3.13-1.6 GM/177ML PO SOLN
1.0000 | Freq: Once | ORAL | 0 refills | Status: AC
  Filled 2021-08-30: qty 354, fill #0
  Filled 2021-08-30: qty 354, 1d supply, fill #0

## 2021-08-30 NOTE — Progress Notes (Signed)
Patient ID: Kristen [name], female   DOB: 1970-12-22, 51 y.o.   MRN: [id number] HPI: Kristen [name] is a 51 year old female with past medical history significant for mild asthma, dysmenorrhea with previous hysterectomy and right ovary removal who is seen to evaluate abdominal pain, also with GERD fecal urgency.  Patient reports that for the last month or so she has developed a right sided abdominal pain.  This is a cramping or a "spasm" type sensation.  Can last for a few seconds.  Not necessarily daily.  Seems to be worse if she eats beef.  She reports that beef was previously a frequent part of her diet.  Seems to happen pretty close to eating.  She reports her bowel movements as like clockwork and daily but they have been more urgent with little warning.  She states that if she cannot get to the bathroom quickly she could have an accident.  She is not had blood in her stool or melena.  Stools are loose but not diarrhea.  At times she is seeing oil droplets and white mucus in her stool.  Initial bowel movement can be incomplete and require several bowel movements to feel completely evacuated.  Occasionally there is lower abdominal cramping with bowel movement.  From a reflux perspective pantoprazole previously was working well though she has had some nocturnal pyrosis which she attributes to stress in her life.  Her mother was just diagnosed with uterine cancer.  Her dad had colon cancer.  Patient's appetite has been normal without nausea or vomiting.  No dysphagia or odynophagia symptoms.  She had her screening colonoscopy on 02/08/2017.  This was normal with the exception of diverticulosis without diverticulitis.  Also in May 2018 she was seen by Mike GipAmy Esterwood, PA-C with 2 weeks of left-sided abdominal pain and constipation.  Bowel purge resolved her symptoms.  CT scan revealed cystic type structure in the left ovary and she eventually had hysterectomy and left ovary removal.  Pathology on this was  benign.    Past Medical History:  Diagnosis Date   Abnormal Pap smear of cervix    dysplasia    Acute sinusitis 05/26/2018   Anemia    history of   Anxiety    Asthma    seasonal asthma, has not required use of inhaler in over 2 years   Cervical dysplasia 1993   COVID-19 09/13/2020   Depression    Diverticulitis    Dysmenorrhea    Essential hypertension    GERD (gastroesophageal reflux disease)    Headache    history of migraines   IBS (irritable bowel syndrome)    STD (sexually transmitted disease) ~1998   chlamydia     Past Surgical History:  Procedure Laterality Date   ABDOMINAL HYSTERECTOMY     ADENOIDECTOMY     lymph nodes removed   APPENDECTOMY  1990   COLONOSCOPY     COLPOSCOPY  1992   CYSTOSCOPY N/A 02/12/2017   Procedure: CYSTOSCOPY;  Surgeon: Jerene BearsMiller, Mary S, MD;  Location: [facility];  Service: Gynecology;  Laterality: N/A;   TONSILLECTOMY     TOTAL LAPAROSCOPIC HYSTERECTOMY WITH SALPINGECTOMY Bilateral 02/12/2017   Procedure: HYSTERECTOMY TOTAL LAPAROSCOPIC WITH SALPINGECTOMY;  Surgeon: Jerene BearsMiller, Mary S, MD;  Location: [facility];  Service: Gynecology;  Laterality: Bilateral;   TUBAL LIGATION      Outpatient Medications Prior to Visit  Medication Sig Dispense Refill   albuterol (VENTOLIN HFA) 108 (90 Base) MCG/ACT inhaler Inhale 2 puffs into the lungs every 4 (  four) hours as needed. 1 each 0   amLODipine (NORVASC) 5 MG tablet Take 1 tablet (5 mg total) by mouth daily. For blood pressure. 90 tablet 0   cefdinir (OMNICEF) 300 MG capsule Take 1 capsule (300 mg total) by mouth 2 (two) times daily. 14 capsule 0   estradiol (VIVELLE-DOT) 0.1 MG/24HR patch PLACE 1 PATCH ONTO THE SKIN 2 TIMES A WEEK. (USE [facility] [phone]) 8 patch 1   hydrochlorothiazide (HYDRODIURIL) 25 MG tablet Take 1 tablet (25 mg total) by mouth daily. For blood pressure. 90 tablet 0   ketorolac (TORADOL) 10 MG tablet Take 1 tablet (10 mg total) by mouth every 6 (six) hours as needed. 20 tablet 0    orphenadrine (NORFLEX) 100 MG tablet Take 1 tablet (100 mg total) by mouth 2 (two) times daily. 10 tablet 0   pantoprazole (PROTONIX) 40 MG tablet Take 1 tablet (40 mg total) by mouth daily. For heartburn. 90 tablet 0   temazepam (RESTORIL) 15 MG capsule Take 1 capsule (15 mg total) by mouth at bedtime as needed for sleep. 30 capsule 0   No facility-administered medications prior to visit.    Allergies  Allergen Reactions   Amoxicillin Nausea Only    Has patient had a PCN reaction causing immediate rash, facial/tongue/throat swelling, SOB or lightheadedness with hypotension: No Has patient had a PCN reaction causing severe rash involving mucus membranes or skin necrosis: No Has patient had a PCN reaction that required hospitalization: No Has patient had a PCN reaction occurring within the last 10 years: Yes If all of the above answers are "NO", then may proceed with Cephalosporin use.    Doxycycline    Penicillin G Rash    Has patient had a PCN reaction causing immediate rash, facial/tongue/throat swelling, SOB or lightheadedness with hypotension: Unknown Has patient had a PCN reaction causing severe rash involving mucus membranes or skin necrosis: Unknown Has patient had a PCN reaction that required hospitalization: Unknown Has patient had a PCN reaction occurring within the last 10 years: No If all of the above answers are "NO", then may proceed with Cephalosporin use.     Family History  Problem Relation Age of Onset   Hypertension Mother    Depression Mother    Diabetes Maternal Grandmother    Hypertension Maternal Grandmother    Colon cancer Father 79   Diabetes Brother    Stomach cancer Neg Hx    Breast cancer Neg Hx     Social History   Tobacco Use   Smoking status: Never   Smokeless tobacco: Never  Vaping Use   Vaping Use: Never used  Substance Use Topics   Alcohol use: No   Drug use: No    ROS: As per history of present illness, otherwise negative  Ht 5'  1" (1.549 m)    Wt 214 lb (97.1 kg)    LMP 01/27/2017 (Exact Date)    BMI 40.43 kg/m  Constitutional: Well-developed and well-nourished. No distress. Gen: awake, alert, NAD HEENT: anicteric  CV: RRR, no mrg Pulm: CTA b/l Abd: soft, tenderness in the right upper and mid abdomen without rebound or guarding,  +BS throughout Ext: no c/c/e Neuro: nonfocal   RELEVANT LABS AND IMAGING: CBC    Component Value Date/Time   WBC 5.8 03/06/2019 0918   WBC 7.2 04/03/2018 1848   RBC 4.21 03/06/2019 0918   RBC 4.08 04/03/2018 1848   HGB 13.3 03/06/2019 0918   HCT 41.3 03/06/2019 0918   PLT  360 03/06/2019 0918   MCV 98 (H) 03/06/2019 0918   MCH 31.6 03/06/2019 0918   MCH 33.2 04/03/2018 1848   MCHC 32.2 03/06/2019 0918   MCHC 34.4 04/03/2018 1848   RDW 12.1 03/06/2019 0918   LYMPHSABS 2.2 12/26/2016 1308   MONOABS 0.7 12/26/2016 1308   EOSABS 0.1 12/26/2016 1308   BASOSABS 0.1 12/26/2016 1308    CMP     Component Value Date/Time   NA 138 02/29/2020 1537   NA 142 03/06/2019 0918   K 4.0 02/29/2020 1537   CL 102 02/29/2020 1537   CO2 30 02/29/2020 1537   GLUCOSE 75 02/29/2020 1537   BUN 12 02/29/2020 1537   BUN 9 03/06/2019 0918   CREATININE 1.00 02/29/2020 1537   CALCIUM 9.6 02/29/2020 1537   PROT 7.7 02/29/2020 1537   PROT 7.3 03/06/2019 0918   ALBUMIN 4.2 02/29/2020 1537   ALBUMIN 4.2 03/06/2019 0918   AST 13 02/29/2020 1537   ALT 16 02/29/2020 1537   ALKPHOS 44 02/29/2020 1537   BILITOT 0.4 02/29/2020 1537   BILITOT <0.2 03/06/2019 0918   GFRNONAA 77 03/06/2019 0918   GFRAA 89 03/06/2019 0918    ASSESSMENT/PLAN: 51 year old female with past medical history significant for mild asthma, dysmenorrhea with previous hysterectomy and right ovary removal who is seen to evaluate abdominal pain, also with GERD fecal urgency.  Right-sided abdominal pain --not consistent with dyspepsia.  Could be an irritable type response or related to her bowel habits. --Right upper quadrant  ultrasound  2.  GERD --previously well controlled on pantoprazole once a day, recent nocturnal pyrosis not on a nightly basis. --Continue pantoprazole 40 mg daily --Add famotidine 20 mg at bedtime on an as-needed basis, I suggested she try this for several weeks to see if she can recapture complete control of her GERD.  After this if effective she can use the famotidine only as needed  3.  Family history of colon cancer --her father had colon cancer, she had a normal colonoscopy 5 years ago.  Repeat screening colonoscopy recommended at this time.  We reviewed the risk, benefits and alternatives and she is agreeable and wishes to proceed  4.  Fecal urgency/question steatorrhea --some fecal urgency and she describes mucus and occasional oil droplets. --Check fecal elastase --If symptoms persist could consider short trial of Creon      NA:[facility], [facility] [place],  [place] [address]

## 2021-08-30 NOTE — Patient Instructions (Signed)
Your provider has requested that you go to the basement level for lab work before leaving today. Press "B" on the elevator. The lab is located at the first door on the left as you exit the elevator.  Continue pantoprazole.   You can add over the counter Pepcid 20 mg if needed at bedtime.   You have been scheduled for an abdominal ultrasound at [facility] ([address]) on 09/21/21 at 8:00am. Please arrive 15 minutes prior to your appointment for registration. Make certain not to have anything to eat or drink 6 hours prior to your appointment. Should you need to reschedule your appointment, please contact radiology at [phone]. This test typically takes about 30 minutes to perform.  You have been scheduled for a colonoscopy. Please follow written instructions given to you at your visit today.  Please pick up your prep supplies at the pharmacy within the next 1-3 days. If you use inhalers (even only as needed), please bring them with you on the day of your procedure.  Due to recent changes in healthcare laws, you may see the results of your imaging and laboratory studies on MyChart before your provider has had a chance to review them.  We understand that in some cases there may be results that are confusing or concerning to you. Not all laboratory results come back in the same time frame and the provider may be waiting for multiple results in order to interpret others.  Please give [place] 48 hours in order for your provider to thoroughly review all the results before contacting the office for clarification of your results.   The [facility] providers would like to encourage you to use [facility] to communicate with providers for non-urgent requests or questions.  Due to long hold times on the telephone, sending your provider a message by [facility] may be a faster and more efficient way to get a response.  Please allow 48 business hours for a response.  Please  remember that this is for non-urgent requests.

## 2021-09-05 ENCOUNTER — Ambulatory Visit (HOSPITAL_BASED_OUTPATIENT_CLINIC_OR_DEPARTMENT_OTHER): Payer: 59 | Admitting: Obstetrics & Gynecology

## 2021-09-05 ENCOUNTER — Other Ambulatory Visit: Payer: 59

## 2021-09-05 ENCOUNTER — Other Ambulatory Visit: Payer: Self-pay

## 2021-09-05 ENCOUNTER — Encounter (HOSPITAL_BASED_OUTPATIENT_CLINIC_OR_DEPARTMENT_OTHER): Payer: Self-pay | Admitting: Obstetrics & Gynecology

## 2021-09-05 VITALS — BP 142/108 | HR 71 | Ht 61.5 in | Wt 215.4 lb

## 2021-09-05 DIAGNOSIS — N951 Menopausal and female climacteric states: Secondary | ICD-10-CM

## 2021-09-05 DIAGNOSIS — Z658 Other specified problems related to psychosocial circumstances: Secondary | ICD-10-CM

## 2021-09-05 DIAGNOSIS — R152 Fecal urgency: Secondary | ICD-10-CM | POA: Diagnosis not present

## 2021-09-05 MED ORDER — ESTRADIOL 0.05 MG/24HR TD PTWK
0.0500 mg | MEDICATED_PATCH | TRANSDERMAL | 1 refills | Status: DC
  Filled 2021-09-05: qty 12, 84d supply, fill #0
  Filled 2021-12-27: qty 12, 84d supply, fill #1

## 2021-09-05 MED ORDER — SERTRALINE HCL 50 MG PO TABS
50.0000 mg | ORAL_TABLET | Freq: Every day | ORAL | 1 refills | Status: DC
  Filled 2021-09-05: qty 30, 30d supply, fill #0

## 2021-09-05 NOTE — Progress Notes (Signed)
GYNECOLOGY  VISIT  CC:   hot flashes, mood changes  HPI: 51 y.o. [id number] Married Black or [place] American female here for discussion of hot flashes and mood changes.  Stopped her estradiol patch when it ran out.  It having a lot of symptoms again.  Has a lot of social stressors going on at the moment at well.  Her mother has been diagnosed with metastatic uterine cancer with lung mets.  This is very stressful for the pt.  She is tearful today.  Her mother just had a port placed and is going to start chemotherapy.  Pt has been on SSRI, effexor and wellbutrin in the past.  Not sure what she needs to start with at this time.  Options discussed.  GYNECOLOGIC HISTORY: Patient's last menstrual period was 01/27/2017 (exact date). Contraception: hysterectomy Menopausal hormone therapy: none  Patient Active Problem List   Diagnosis Date Noted   COVID-19 virus infection 09/30/2020   Goiter 02/29/2020   Vitamin D deficiency 02/29/2020   Chronic right hip pain 05/30/2018   Abnormal facial hair 05/30/2018   GAD (generalized anxiety disorder) 04/25/2018   Chest pain 09/04/2017   Hot flashes 05/28/2017   Obesity (BMI 30-39.9) 09/03/2016   Intracranial hypertension, benign 06/22/2016   Visual disturbance 05/18/2016   Other fatigue 03/01/2016   Preventative health care 01/03/2016   Insomnia 11/29/2015   Essential hypertension 11/11/2015   GERD (gastroesophageal reflux disease) 11/11/2015    Past Medical History:  Diagnosis Date   Abnormal Pap smear of cervix    dysplasia    Acute sinusitis 05/26/2018   Anemia    history of   Anxiety    Asthma    seasonal asthma, has not required use of inhaler in over 2 years   Cervical dysplasia 1993   COVID-19 09/13/2020   Depression    Diverticulitis    Dysmenorrhea    Essential hypertension    GERD (gastroesophageal reflux disease)    Headache    history of migraines   IBS (irritable bowel syndrome)    STD (sexually transmitted disease) ~1998    chlamydia     Past Surgical History:  Procedure Laterality Date   ABDOMINAL HYSTERECTOMY     ADENOIDECTOMY     lymph nodes removed   Mobile N/A 02/12/2017   Procedure: CYSTOSCOPY;  Surgeon: Megan Salon, MD;  Location: [facility];  Service: Gynecology;  Laterality: N/A;   TONSILLECTOMY     TOTAL LAPAROSCOPIC HYSTERECTOMY WITH SALPINGECTOMY Bilateral 02/12/2017   Procedure: HYSTERECTOMY TOTAL LAPAROSCOPIC WITH SALPINGECTOMY;  Surgeon: Megan Salon, MD;  Location: [facility];  Service: Gynecology;  Laterality: Bilateral;   TUBAL LIGATION      MEDS:   Current Outpatient Medications on File Prior to Visit  Medication Sig Dispense Refill   albuterol (VENTOLIN HFA) 108 (90 Base) MCG/ACT inhaler Inhale 2 puffs into the lungs every 4 (four) hours as needed. 1 each 0   amLODipine (NORVASC) 5 MG tablet Take 1 tablet (5 mg total) by mouth daily. For blood pressure. 90 tablet 0   pantoprazole (PROTONIX) 40 MG tablet Take 1 tablet (40 mg total) by mouth daily. For heartburn. 90 tablet 0   temazepam (RESTORIL) 15 MG capsule Take 1 capsule (15 mg total) by mouth at bedtime as needed for sleep. 30 capsule 0   hydrochlorothiazide (HYDRODIURIL) 25 MG tablet Take 1 tablet (25 mg total) by mouth daily.  For blood pressure. 90 tablet 0   No current facility-administered medications on file prior to visit.    ALLERGIES: Amoxicillin, Doxycycline, and Penicillin g  Family History  Problem Relation Age of Onset   Hypertension Mother    Depression Mother    Diabetes Maternal Grandmother    Hypertension Maternal Grandmother    Colon cancer Father 53   Diabetes Brother    Stomach cancer Neg Hx    Breast cancer Neg Hx     SH:  engaged, non smoker  Review of Systems  Psychiatric/Behavioral:         Stressors   PHYSICAL EXAMINATION:    BP (!) 142/108 (BP Location: Right Arm, Patient Position: Sitting, Cuff Size: Large)    Pulse 71    Ht 5'  1.5" (1.562 m) Comment: reported   Wt 215 lb 6.4 oz (97.7 kg)    LMP 01/27/2017 (Exact Date)    BMI 40.04 kg/m     Physical Exam Constitutional:      Appearance: Normal appearance.  Neurological:     General: No focal deficit present.     Mental Status: She is alert.  Psychiatric:        Mood and Affect: Mood normal.    Assessment/Plan: 1. Vasomotor symptoms due to menopause - will restart HRT.  Risks reviewed.  Needs yearly MMG and this is already scheduled - estradiol (CLIMARA) 0.05 mg/24hr patch; Place 1 patch (0.05 mg total) onto the skin once a week.  Dispense: 12 patch; Refill: 1  2. Psychosocial stressors - sertraline (ZOLOFT) 50 MG tablet; Take 1 tablet (50 mg total) by mouth daily.  Dispense: 30 tablet; Refill: 1 - pt to give updated 3-4 weeks and she may needs medication adjustments depending on how her mother responds to therapy

## 2021-09-06 ENCOUNTER — Ambulatory Visit (AMBULATORY_SURGERY_CENTER): Payer: 59 | Admitting: Internal Medicine

## 2021-09-06 ENCOUNTER — Other Ambulatory Visit: Payer: Self-pay

## 2021-09-06 ENCOUNTER — Encounter: Payer: Self-pay | Admitting: Internal Medicine

## 2021-09-06 VITALS — BP 134/102 | HR 83 | Temp 97.8°F | Resp 15 | Ht 61.0 in | Wt 214.0 lb

## 2021-09-06 DIAGNOSIS — Z800 Family history of malignant neoplasm of digestive organs: Secondary | ICD-10-CM | POA: Diagnosis not present

## 2021-09-06 DIAGNOSIS — Z1211 Encounter for screening for malignant neoplasm of colon: Secondary | ICD-10-CM

## 2021-09-06 DIAGNOSIS — I10 Essential (primary) hypertension: Secondary | ICD-10-CM | POA: Diagnosis not present

## 2021-09-06 DIAGNOSIS — J45909 Unspecified asthma, uncomplicated: Secondary | ICD-10-CM | POA: Diagnosis not present

## 2021-09-06 MED ORDER — SODIUM CHLORIDE 0.9 % IV SOLN: 500.0000 mL | Freq: Once | INTRAVENOUS | Status: DC

## 2021-09-06 NOTE — Progress Notes (Signed)
Report given to PACU, vss 

## 2021-09-06 NOTE — Op Note (Signed)
[facility] Patient Name: Kristen [name] Procedure Date: 09/06/2021 3:45 PM MRN: [id number] Endoscopist: Jerene Bears , MD Age: 51 Referring MD:  Date of Birth: February 08, 1971 Gender: Female Account #: [id number] Procedure:                Colonoscopy Indications:              Screening in patient at increased risk: Family                            history of 1st-degree relative with colorectal                            cancer, Last colonoscopy: July 2018 Medicines:                Monitored Anesthesia Care Procedure:                Pre-Anesthesia Assessment:                           - Prior to the procedure, a History and Physical                            was performed, and patient medications and                            allergies were reviewed. The patient's tolerance of                            previous anesthesia was also reviewed. The risks                            and benefits of the procedure and the sedation                            options and risks were discussed with the patient.                            All questions were answered, and informed consent                            was obtained. Prior Anticoagulants: The patient has                            taken no previous anticoagulant or antiplatelet                            agents. ASA Grade Assessment: III - A patient with                            severe systemic disease. After reviewing the risks                            and benefits, the patient was deemed in  satisfactory condition to undergo the procedure.                           After obtaining informed consent, the colonoscope                            was passed under direct vision. Throughout the                            procedure, the patient's blood pressure, pulse, and                            oxygen saturations were monitored continuously. The                            Olympus CF-HQ190L  [id number]) Colonoscope was                            introduced through the anus and advanced to the the                            cecum, identified by appendiceal orifice and                            ileocecal valve. The colonoscopy was performed                            without difficulty. The patient tolerated the                            procedure well. The quality of the bowel                            preparation was good. The ileocecal valve,                            appendiceal orifice, and rectum were photographed. Scope In: 4:01:50 PM Scope Out: 4:15:37 PM Scope Withdrawal Time: 0 hours 11 minutes 7 seconds  Total Procedure Duration: 0 hours 13 minutes 47 seconds  Findings:                 The digital rectal exam was normal.                           The colon (entire examined portion) appeared normal.                           External and internal hemorrhoids were found during                            retroflexion and during digital exam. The                            hemorrhoids were small. Complications:            No  immediate complications. Estimated Blood Loss:     Estimated blood loss was minimal. Estimated blood                            loss: none. Impression:               - The entire examined colon is normal.                           - External and internal hemorrhoids.                           - No specimens collected. Recommendation:           - Patient has a contact number available for                            emergencies. The signs and symptoms of potential                            delayed complications were discussed with the                            patient. Return to normal activities tomorrow.                            Written discharge instructions were provided to the                            patient.                           - Resume previous diet.                           - Continue present medications.                            - Repeat colonoscopy in 5 years for screening                            purposes. Jerene Bears, MD 09/06/2021 4:18:28 PM This report has been signed electronically.

## 2021-09-06 NOTE — Progress Notes (Signed)
VS-AS  

## 2021-09-06 NOTE — Progress Notes (Signed)
Patient presents for colonoscopy in setting of family history of colon cancer.  Seen in the office on 08/30/2021.  See the note for details.  She remains appropriate for colonoscopy in the [facility] today.

## 2021-09-06 NOTE — Patient Instructions (Signed)
Thank you for allowing [place] to care for you today! Resume previous diet and medications today, return to normal daily activities tomorrow, 09/07/21. Recommend next screening colonoscopy in 5 years due to family history.   YOU HAD AN ENDOSCOPIC PROCEDURE TODAY AT THE [facility]:   Refer to the procedure report that was given to you for any specific questions about what was found during the examination.  If the procedure report does not answer your questions, please call your gastroenterologist to clarify.  If you requested that your care partner not be given the details of your procedure findings, then the procedure report has been included in a sealed envelope for you to review at your convenience later.  YOU SHOULD EXPECT: Some feelings of bloating in the abdomen. Passage of more gas than usual.  Walking can help get rid of the air that was put into your GI tract during the procedure and reduce the bloating. If you had a lower endoscopy (such as a colonoscopy or flexible sigmoidoscopy) you may notice spotting of blood in your stool or on the toilet paper. If you underwent a bowel prep for your procedure, you may not have a normal bowel movement for a few days.  Please Note:  You might notice some irritation and congestion in your nose or some drainage.  This is from the oxygen used during your procedure.  There is no need for concern and it should clear up in a day or so.  SYMPTOMS TO REPORT IMMEDIATELY:  Following lower endoscopy (colonoscopy or flexible sigmoidoscopy):  Excessive amounts of blood in the stool  Significant tenderness or worsening of abdominal pains  Swelling of the abdomen that is new, acute  Fever of 100F or higher   For urgent or emergent issues, a gastroenterologist can be reached at any hour by calling [phone]. Do not use MyChart messaging for urgent concerns.    DIET:  We do recommend a small meal at first, but then you may proceed to your regular  diet.  Drink plenty of fluids but you should avoid alcoholic beverages for 24 hours.  ACTIVITY:  You should plan to take it easy for the rest of today and you should NOT DRIVE or use heavy machinery until tomorrow (because of the sedation medicines used during the test).    FOLLOW UP: Our staff will call the number listed on your records 48-72 hours following your procedure to check on you and address any questions or concerns that you may have regarding the information given to you following your procedure. If we do not reach you, we will leave a message.  We will attempt to reach you two times.  During this call, we will ask if you have developed any symptoms of COVID 19. If you develop any symptoms (ie: fever, flu-like symptoms, shortness of breath, cough etc.) before then, please call [phone].  If you test positive for Covid 19 in the 2 weeks post procedure, please call and report this information to [place].    If any biopsies were taken you will be contacted by phone or by letter within the next 1-3 weeks.  Please call us at [phone] if you have not heard about the biopsies in 3 weeks.    SIGNATURES/CONFIDENTIALITY: You and/or your care partner have signed paperwork which will be entered into your electronic medical record.  These signatures attest to the fact that that the information above on your After Visit Summary has been reviewed and is  understood.  Full responsibility of the confidentiality of this discharge information lies with you and/or your care-partner.

## 2021-09-07 ENCOUNTER — Other Ambulatory Visit: Payer: Self-pay

## 2021-09-08 ENCOUNTER — Telehealth: Payer: Self-pay

## 2021-09-08 NOTE — Telephone Encounter (Signed)
°  Follow up Call-  Call back number 09/06/2021  Post procedure Call Back phone  # [phone]  Permission to leave phone message Yes  Some recent data might be hidden     Patient questions:  Do you have a fever, pain , or abdominal swelling? No. Pain Score  0 *  Have you tolerated food without any problems? Yes.    Have you been able to return to your normal activities? Yes.    Do you have any questions about your discharge instructions: Diet   No. Medications  No. Follow up visit  No.  Do you have questions or concerns about your Care? No.  Actions: * If pain score is 4 or above: No action needed, pain <4.  Have you developed a fever since your procedure? no  2.   Have you had an respiratory symptoms (SOB or cough) since your procedure? no  3.   Have you tested positive for COVID 19 since your procedure no  4.   Have you had any family members/close contacts diagnosed with the COVID 19 since your procedure?  no   If yes to any of these questions please route to Laverna Peace, RN and Karlton Lemon, RN

## 2021-09-12 LAB — PANCREATIC ELASTASE, FECAL: Pancreatic Elastase-1, Stool: >500 mcg/g

## 2021-09-13 ENCOUNTER — Other Ambulatory Visit: Payer: Self-pay

## 2021-09-13 ENCOUNTER — Ambulatory Visit
Admission: RE | Admit: 2021-09-13 | Discharge: 2021-09-13 | Disposition: A | Discharge status: Home / Self Care | Payer: 59 | Source: Ambulatory Visit | Attending: Primary Care | Admitting: Primary Care

## 2021-09-13 DIAGNOSIS — Z1231 Encounter for screening mammogram for malignant neoplasm of breast: Secondary | ICD-10-CM | POA: Diagnosis not present

## 2021-09-21 ENCOUNTER — Other Ambulatory Visit: Payer: Self-pay

## 2021-09-21 ENCOUNTER — Ambulatory Visit
Admission: RE | Admit: 2021-09-21 | Discharge: 2021-09-21 | Disposition: A | Discharge status: Home / Self Care | Payer: 59 | Source: Ambulatory Visit | Attending: Internal Medicine | Admitting: Internal Medicine

## 2021-09-21 DIAGNOSIS — R109 Unspecified abdominal pain: Secondary | ICD-10-CM | POA: Diagnosis not present

## 2021-11-01 ENCOUNTER — Encounter: Payer: 59 | Admitting: Primary Care

## 2021-12-27 ENCOUNTER — Other Ambulatory Visit: Payer: Self-pay

## 2021-12-28 ENCOUNTER — Other Ambulatory Visit: Payer: Self-pay

## 2022-01-03 ENCOUNTER — Other Ambulatory Visit: Payer: Self-pay

## 2022-03-01 ENCOUNTER — Other Ambulatory Visit (HOSPITAL_BASED_OUTPATIENT_CLINIC_OR_DEPARTMENT_OTHER): Payer: Self-pay | Admitting: Obstetrics & Gynecology

## 2022-03-01 ENCOUNTER — Telehealth (HOSPITAL_BASED_OUTPATIENT_CLINIC_OR_DEPARTMENT_OTHER): Payer: Self-pay

## 2022-03-01 ENCOUNTER — Other Ambulatory Visit (HOSPITAL_BASED_OUTPATIENT_CLINIC_OR_DEPARTMENT_OTHER): Payer: Self-pay

## 2022-03-01 ENCOUNTER — Encounter (HOSPITAL_BASED_OUTPATIENT_CLINIC_OR_DEPARTMENT_OTHER): Payer: Self-pay | Admitting: Obstetrics & Gynecology

## 2022-03-01 MED ORDER — ESTRADIOL 0.05 MG/24HR TD PTTW
1.0000 | MEDICATED_PATCH | TRANSDERMAL | 1 refills | Status: DC
  Filled 2022-03-01 – 2022-03-06 (×3): qty 24, 84d supply, fill #0
  Filled 2022-03-06: qty 8, 28d supply, fill #0

## 2022-03-01 NOTE — Telephone Encounter (Signed)
Patient called today wanting to refill her estradiol patch. She states that she is currently on one patch a week. She would like to change it to twice a week as she is running into the patches coming off quite often. She states originally she was using the patches twice a week. Please advise. tbw

## 2022-03-01 NOTE — Progress Notes (Signed)
es

## 2022-03-06 ENCOUNTER — Other Ambulatory Visit (HOSPITAL_BASED_OUTPATIENT_CLINIC_OR_DEPARTMENT_OTHER): Payer: Self-pay

## 2022-03-06 ENCOUNTER — Other Ambulatory Visit: Payer: Self-pay

## 2022-03-07 ENCOUNTER — Other Ambulatory Visit (HOSPITAL_BASED_OUTPATIENT_CLINIC_OR_DEPARTMENT_OTHER): Payer: Self-pay

## 2022-03-08 ENCOUNTER — Other Ambulatory Visit (HOSPITAL_BASED_OUTPATIENT_CLINIC_OR_DEPARTMENT_OTHER): Payer: Self-pay

## 2022-03-14 ENCOUNTER — Other Ambulatory Visit (HOSPITAL_BASED_OUTPATIENT_CLINIC_OR_DEPARTMENT_OTHER): Payer: Self-pay

## 2022-03-19 ENCOUNTER — Telehealth: Payer: Self-pay | Admitting: Primary Care

## 2022-03-19 NOTE — Telephone Encounter (Signed)
Patient states that she would like to transfer care from [facility] to Dr. Ruthine Dose since she has moved to [place], [place] and her daughter will be being seen at the same office. Is this okay with you?

## 2022-03-20 NOTE — Telephone Encounter (Signed)
Absolutely! We wish her the very best!

## 2022-03-27 ENCOUNTER — Other Ambulatory Visit (HOSPITAL_BASED_OUTPATIENT_CLINIC_OR_DEPARTMENT_OTHER): Payer: Self-pay | Admitting: Obstetrics & Gynecology

## 2022-03-27 ENCOUNTER — Other Ambulatory Visit: Payer: Self-pay | Admitting: *Deleted

## 2022-03-27 ENCOUNTER — Other Ambulatory Visit: Payer: Self-pay

## 2022-03-27 ENCOUNTER — Other Ambulatory Visit: Payer: Self-pay | Admitting: Obstetrics & Gynecology

## 2022-03-27 MED ORDER — ESTRADIOL 0.1 MG/24HR TD PTWK
0.1000 mg | MEDICATED_PATCH | TRANSDERMAL | 2 refills | Status: DC
  Filled 2022-03-27: qty 4, 28d supply, fill #0

## 2022-03-27 MED FILL — Estradiol TD Patch Twice Weekly 0.1 MG/24HR: TRANSDERMAL | 28 days supply | Qty: 8 | Fill #0 | Status: AC

## 2022-03-28 NOTE — Progress Notes (Addendum)
HPI: FU hypertension. Echo March 2019 showed normal LV function, moderate diastolic dysfunction.  Exercise treadmill March 2019 no ischemia.  CTA of neck August 2019 negative.  Carotid Dopplers July 2021 showed no stenosis.  Since last seen she denies exertional chest pain, syncope.  She has some dyspnea on exertion but no orthopnea, PND or pedal edema.  She feels some leg fatigue with ambulation.  Her blood pressure has been running high at home with a diastolic of 88.  Current Outpatient Medications  Medication Sig Dispense Refill   albuterol (VENTOLIN HFA) 108 (90 Base) MCG/ACT inhaler Inhale 2 puffs into the lungs every 4 (four) hours as needed. 1 each 0   amLODipine (NORVASC) 5 MG tablet Take 1 tablet (5 mg total) by mouth daily. For blood pressure. 90 tablet 0   estradiol (VIVELLE-DOT) 0.1 MG/24HR patch PLACE 1 PATCH ONTO THE SKIN 2 TIMES A WEEK. (USE NDC [phone]) 8 patch 1   hydrochlorothiazide (HYDRODIURIL) 25 MG tablet Take 1 tablet (25 mg total) by mouth daily. For blood pressure. 90 tablet 0   pantoprazole (PROTONIX) 40 MG tablet Take 1 tablet (40 mg total) by mouth daily. For heartburn. 90 tablet 0   sertraline (ZOLOFT) 50 MG tablet Take 1 tablet (50 mg total) by mouth daily. (Patient not taking: Reported on 09/06/2021) 30 tablet 1   temazepam (RESTORIL) 15 MG capsule Take 1 capsule (15 mg total) by mouth at bedtime as needed for sleep. 30 capsule 0   No current facility-administered medications for this visit.     Past Medical History:  Diagnosis Date   Abnormal Pap smear of cervix    dysplasia    Acute sinusitis 05/26/2018   Allergy    Anemia    history of   Anxiety    Arthritis    per pt in her hands   Asthma    seasonal asthma, has not required use of inhaler in over 2 years   Cervical dysplasia 1993   COVID-19 09/13/2020   Depression    Diverticulitis    Dysmenorrhea    Essential hypertension    GERD (gastroesophageal reflux disease)    Headache     history of migraines   IBS (irritable bowel syndrome)    STD (sexually transmitted disease) ~1998   chlamydia     Past Surgical History:  Procedure Laterality Date   ADENOIDECTOMY     lymph nodes removed   APPENDECTOMY  1990   COLONOSCOPY     CYSTOSCOPY N/A 02/12/2017   Procedure: CYSTOSCOPY;  Surgeon: Jerene Bears, MD;  Location: [facility];  Service: Gynecology;  Laterality: N/A;   TONSILLECTOMY     TOTAL LAPAROSCOPIC HYSTERECTOMY WITH SALPINGECTOMY Bilateral 02/12/2017   Procedure: HYSTERECTOMY TOTAL LAPAROSCOPIC WITH SALPINGECTOMY;  Surgeon: Jerene Bears, MD;  Location: [facility];  Service: Gynecology;  Laterality: Bilateral;   TUBAL LIGATION      Social History   Socioeconomic History   Marital status: Married    Spouse name: Not on file   Number of children: 3   Years of education: 18   Highest education level: Not on file  Occupational History    Comment: [employer], [employer] Coach  Tobacco Use   Smoking status: Never   Smokeless tobacco: Never  Vaping Use   Vaping Use: Never used  Substance and Sexual Activity   Alcohol use: No   Drug use: No   Sexual activity: Not Currently    Partners: Male  Birth control/protection: Surgical  Other Topics Concern   Not on file  Social History Narrative   Separated.    3 children.    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    Social Determinants of Corporate investment banker Strain: Not on file  Food Insecurity: Not on file  Transportation Needs: Not on file  Physical Activity: Not on file  Stress: Not on file  Social Connections: Not on file  Intimate Partner Violence: Not on file    Family History  Problem Relation Age of Onset   Uterine cancer Mother        mets to lungs   Hypertension Mother    Depression Mother    Colon cancer Father 58   Diabetes Brother    Diabetes Maternal Grandmother    Hypertension Maternal Grandmother    Stomach cancer Neg  Hx    Breast cancer Neg Hx    Esophageal cancer Neg Hx    Rectal cancer Neg Hx     ROS: Some fatigue but no fevers or chills, productive cough, hemoptysis, dysphasia, odynophagia, melena, hematochezia, dysuria, hematuria, rash, seizure activity, orthopnea, PND, pedal edema, claudication. Remaining systems are negative.  Physical Exam: Well-developed well-nourished in no acute distress.  Skin is warm and dry.  HEENT is normal.  Neck is supple.  Chest is clear to auscultation with normal expansion.  Cardiovascular exam is regular rate and rhythm.  Abdominal exam nontender or distended. No masses palpated. Extremities show no edema. neuro grossly intact  ECG-normal sinus rhythm at a rate of 86, no ST changes.  Personally reviewed  A/P  1 hypertension-patient's blood pressure is mildly elevated at home.  We will begin Avapro 150 mg daily.  Adjust as needed.  Check potassium and renal function in 1 week.  2 history of diastolic dysfunction-no CHF on examination.  Continue to optimize blood pressure control.  3 obesity-we discussed the importance of diet, exercise and weight loss.  Olga Millers, MD

## 2022-03-29 ENCOUNTER — Ambulatory Visit: Payer: 59 | Admitting: Family Medicine

## 2022-03-29 ENCOUNTER — Ambulatory Visit
Admission: RE | Admit: 2022-03-29 | Discharge: 2022-03-29 | Disposition: A | Discharge status: Home / Self Care | Payer: 59 | Source: Ambulatory Visit | Attending: Family Medicine | Admitting: Family Medicine

## 2022-03-29 ENCOUNTER — Encounter: Payer: Self-pay | Admitting: Family Medicine

## 2022-03-29 VITALS — BP 118/82 | HR 73 | Temp 97.9°F | Ht 61.5 in | Wt 216.2 lb

## 2022-03-29 DIAGNOSIS — K219 Gastro-esophageal reflux disease without esophagitis: Secondary | ICD-10-CM | POA: Diagnosis not present

## 2022-03-29 DIAGNOSIS — Z0000 Encounter for general adult medical examination without abnormal findings: Secondary | ICD-10-CM | POA: Diagnosis not present

## 2022-03-29 DIAGNOSIS — Z114 Encounter for screening for human immunodeficiency virus [HIV]: Secondary | ICD-10-CM | POA: Diagnosis not present

## 2022-03-29 DIAGNOSIS — Z6841 Body Mass Index (BMI) 40.0 and over, adult: Secondary | ICD-10-CM | POA: Diagnosis not present

## 2022-03-29 DIAGNOSIS — E049 Nontoxic goiter, unspecified: Secondary | ICD-10-CM | POA: Diagnosis not present

## 2022-03-29 DIAGNOSIS — Z1159 Encounter for screening for other viral diseases: Secondary | ICD-10-CM

## 2022-03-29 DIAGNOSIS — D500 Iron deficiency anemia secondary to blood loss (chronic): Secondary | ICD-10-CM | POA: Diagnosis not present

## 2022-03-29 DIAGNOSIS — E559 Vitamin D deficiency, unspecified: Secondary | ICD-10-CM | POA: Diagnosis not present

## 2022-03-29 DIAGNOSIS — E041 Nontoxic single thyroid nodule: Secondary | ICD-10-CM | POA: Diagnosis not present

## 2022-03-29 DIAGNOSIS — Z23 Encounter for immunization: Secondary | ICD-10-CM | POA: Diagnosis not present

## 2022-03-29 DIAGNOSIS — E66813 Obesity, class 3: Secondary | ICD-10-CM

## 2022-03-29 LAB — VITAMIN D 25 HYDROXY (VIT D DEFICIENCY, FRACTURES): VITD: 39.64 ng/mL (ref 30.00–100.00)

## 2022-03-29 LAB — LIPID PANEL
Cholesterol: 142 mg/dL (ref 0–200)
HDL: 52.1 mg/dL (ref >39.00)
LDL Cholesterol: 81 mg/dL (ref 0–99)
NonHDL: 89.51
Total CHOL/HDL Ratio: 3
Triglycerides: 45 mg/dL (ref 0.0–149.0)
VLDL: 9 mg/dL (ref 0.0–40.0)

## 2022-03-29 LAB — T4, FREE: Free T4: 1.03 ng/dL (ref 0.60–1.60)

## 2022-03-29 LAB — IBC + FERRITIN
Ferritin: 143.8 ng/mL (ref 10.0–291.0)
Iron: 129 ug/dL (ref 42–145)
Saturation Ratios: 35.6 % (ref 20.0–50.0)
TIBC: 362.6 ug/dL (ref 250.0–450.0)
Transferrin: 259 mg/dL (ref 212.0–360.0)

## 2022-03-29 LAB — CBC WITH DIFFERENTIAL/PLATELET
Basophils Absolute: 0 10*3/uL (ref 0.0–0.1)
Basophils Relative: 0.6 % (ref 0.0–3.0)
Eosinophils Absolute: 0.1 10*3/uL (ref 0.0–0.7)
Eosinophils Relative: 2.6 % (ref 0.0–5.0)
HCT: 40.4 % (ref 36.0–46.0)
Hemoglobin: 13.4 g/dL (ref 12.0–15.0)
Lymphocytes Relative: 39.8 % (ref 12.0–46.0)
Lymphs Abs: 2.2 10*3/uL (ref 0.7–4.0)
MCHC: 33.1 g/dL (ref 30.0–36.0)
MCV: 97.6 fl (ref 78.0–100.0)
Monocytes Absolute: 0.5 10*3/uL (ref 0.1–1.0)
Monocytes Relative: 8.8 % (ref 3.0–12.0)
Neutro Abs: 2.6 10*3/uL (ref 1.4–7.7)
Neutrophils Relative %: 48.2 % (ref 43.0–77.0)
Platelets: 319 10*3/uL (ref 150.0–400.0)
RBC: 4.14 Mil/uL (ref 3.87–5.11)
RDW: 12.5 % (ref 11.5–15.5)
WBC: 5.4 10*3/uL (ref 4.0–10.5)

## 2022-03-29 LAB — TSH: TSH: 2.71 u[IU]/mL (ref 0.35–5.50)

## 2022-03-29 LAB — HEMOGLOBIN A1C: Hgb A1c MFr Bld: 5.5 % (ref 4.6–6.5)

## 2022-03-29 LAB — COMPREHENSIVE METABOLIC PANEL
ALT: 24 U/L (ref 0–35)
AST: 22 U/L (ref 0–37)
Albumin: 4.1 g/dL (ref 3.5–5.2)
Alkaline Phosphatase: 41 U/L (ref 39–117)
BUN: 14 mg/dL (ref 6–23)
CO2: 27 mEq/L (ref 19–32)
Calcium: 9.3 mg/dL (ref 8.4–10.5)
Chloride: 105 mEq/L (ref 96–112)
Creatinine, Ser: 0.85 mg/dL (ref 0.40–1.20)
GFR: 79.75 mL/min (ref >60.00)
Glucose, Bld: 85 mg/dL (ref 70–99)
Potassium: 4.6 mEq/L (ref 3.5–5.1)
Sodium: 140 mEq/L (ref 135–145)
Total Bilirubin: 0.5 mg/dL (ref 0.2–1.2)
Total Protein: 7.7 g/dL (ref 6.0–8.3)

## 2022-03-29 LAB — MAGNESIUM: Magnesium: 2.1 mg/dL (ref 1.5–2.5)

## 2022-03-29 LAB — VITAMIN B12: Vitamin B-12: 338 pg/mL (ref 211–911)

## 2022-03-29 MED ORDER — PANTOPRAZOLE SODIUM 40 MG PO TBEC
40.0000 mg | DELAYED_RELEASE_TABLET | Freq: Every day | ORAL | 3 refills | Status: AC
  Filled 2022-06-25: qty 90, 90d supply, fill #0
  Filled 2022-09-06 – 2022-09-07 (×2): qty 90, 90d supply, fill #1

## 2022-03-29 NOTE — Progress Notes (Signed)
Phone [phone]   Subjective:   Patient is a 51 y.o. female presenting for annual physical.    Chief Complaint  Patient presents with   Transfer of Care    Need protonix refilled Fasting for blood work    TOC-moved to [facility].   Annual exam GERD-on protonix.  Some dysphagia. Working well.  If misses dose-gets bad that same day, but doesn't take daily. Arabella Merles for 1 yr-getting larger. ?dysphagia.  Was getting u/s intermitt.  Last 2-3 yrs ago.  HTN- Dr. Jens Som for HTN.  See problem oriented charting- ROS- ROS: Gen: no fever, chills  Skin: no rash, itching ENT: no ear pain, ear drainage, nasal congestion, rhinorrhea, sinus pressure, sore throat Eyes: no blurry vision, double vision Resp: no cough, wheeze.  Some DOE wstairs.  CV: occ cp upper L chest wall-dull and occ palp-sees Dr. Jens Som.  GI: has IBS so all over the place.  urgency GU: no dysuria, urgency, frequency, hematuria MSK: R knee Neuro: no dizziness, headache, weakness, vertigo.  Some HA if not drinking enough Psych: no depression, anxiety, SI .  Intermitt insomnia  H/o IDA-feels achey.   The following were reviewed and entered/updated in epic: Past Medical History:  Diagnosis Date   Abnormal Pap smear of cervix    dysplasia    Acute sinusitis 05/26/2018   Allergy    Anemia    history of   Anxiety    Arthritis    per pt in her hands   Asthma    seasonal asthma, has not required use of inhaler in over 2 years   Cervical dysplasia 1993   COVID-19 09/13/2020   Depression    Diverticulitis    Dysmenorrhea    Essential hypertension    GERD (gastroesophageal reflux disease)    Headache    history of migraines   IBS (irritable bowel syndrome)    STD (sexually transmitted disease) ~1998   chlamydia    Patient Active Problem List   Diagnosis Date Noted   COVID-19 virus infection 09/30/2020   Goiter 02/29/2020   Vitamin D deficiency 02/29/2020   Chronic right hip pain 05/30/2018   Abnormal  facial hair 05/30/2018   GAD (generalized anxiety disorder) 04/25/2018   Chest pain 09/04/2017   Hot flashes 05/28/2017   Obesity (BMI 30-39.9) 09/03/2016   Intracranial hypertension, benign 06/22/2016   Visual disturbance 05/18/2016   Other fatigue 03/01/2016   Preventative health care 01/03/2016   Insomnia 11/29/2015   Essential hypertension 11/11/2015   GERD (gastroesophageal reflux disease) 11/11/2015   Past Surgical History:  Procedure Laterality Date   ADENOIDECTOMY     lymph nodes removed   APPENDECTOMY  1990   COLONOSCOPY     CYSTOSCOPY N/A 02/12/2017   Procedure: CYSTOSCOPY;  Surgeon: Jerene Bears, MD;  Location: [facility];  Service: Gynecology;  Laterality: N/A;   TONSILLECTOMY     TOTAL LAPAROSCOPIC HYSTERECTOMY WITH SALPINGECTOMY Bilateral 02/12/2017   Procedure: HYSTERECTOMY TOTAL LAPAROSCOPIC WITH SALPINGECTOMY;  Surgeon: Jerene Bears, MD;  Location: [facility];  Service: Gynecology;  Laterality: Bilateral;   TUBAL LIGATION      Family History  Problem Relation Age of Onset   Uterine cancer Mother        mets to lungs   Hypertension Mother    Depression Mother    Colon cancer Father 71   Diabetes Brother    Diabetes Maternal Grandmother    Hypertension Maternal Grandmother    Stomach cancer Neg Hx  Breast cancer Neg Hx    Esophageal cancer Neg Hx    Rectal cancer Neg Hx     Medications- reviewed and updated Current Outpatient Medications  Medication Sig Dispense Refill   albuterol (VENTOLIN HFA) 108 (90 Base) MCG/ACT inhaler Inhale 2 puffs into the lungs every 4 (four) hours as needed. 1 each 0   estradiol (VIVELLE-DOT) 0.1 MG/24HR patch PLACE 1 PATCH ONTO THE SKIN 2 TIMES A WEEK. (USE NDC [phone]) 8 patch 1   temazepam (RESTORIL) 15 MG capsule Take 1 capsule (15 mg total) by mouth at bedtime as needed for sleep. 30 capsule 0   hydrochlorothiazide (HYDRODIURIL) 25 MG tablet Take 1 tablet (25 mg total) by mouth daily. For blood pressure. 90 tablet 0    pantoprazole (PROTONIX) 40 MG tablet Take 1 tablet (40 mg total) by mouth daily. For heartburn. 90 tablet 3   No current facility-administered medications for this visit.    Allergies-reviewed and updated Allergies  Allergen Reactions   Amoxicillin Nausea Only    Has patient had a PCN reaction causing immediate rash, facial/tongue/throat swelling, SOB or lightheadedness with hypotension: No Has patient had a PCN reaction causing severe rash involving mucus membranes or skin necrosis: No Has patient had a PCN reaction that required hospitalization: No Has patient had a PCN reaction occurring within the last 10 years: Yes If all of the above answers are "NO", then may proceed with Cephalosporin use.    Doxycycline    Penicillin G Rash    Has patient had a PCN reaction causing immediate rash, facial/tongue/throat swelling, SOB or lightheadedness with hypotension: Unknown Has patient had a PCN reaction causing severe rash involving mucus membranes or skin necrosis: Unknown Has patient had a PCN reaction that required hospitalization: Unknown Has patient had a PCN reaction occurring within the last 10 years: No If all of the above answers are "NO", then may proceed with Cephalosporin use.     Social History   Social History Narrative   Separated.    3 children.    Works as an Charity fundraiser in [employer]   Enjoys relaxing, spending time with her daughter.    Objective  Objective:  BP 118/82   Pulse 73   Temp 97.9 F (36.6 C) (Temporal)   Ht 5' 1.5" (1.562 m)   Wt 216 lb 4 oz (98.1 kg)   LMP 01/27/2017 (Exact Date)   SpO2 96%   BMI 40.20 kg/m  Physical Exam  Gen: WDWN NAD MOAAF HEENT: NCAT, conjunctiva not injected, sclera nonicteric TM WNL B, OP moist, no exudates  NECK:  supple,+R thyromegaly, no nodes, no carotid bruits CARDIAC: RRR, S1S2+, no murmur. DP 2+B LUNGS: CTAB. No wheezes ABDOMEN:  BS+, soft, NTND, No HSM, no masses EXT:  no edema MSK: no gross abnormalities. MS  5/5 all 4 NEURO: A&O x3.  CN II-XII intact.  PSYCH: normal mood. Good eye contact    Assessment and Plan   Health Maintenance counseling: 1. Anticipatory guidance: Patient counseled regarding regular dental exams q6 months, eye exams,  avoiding smoking and second hand smoke, limiting alcohol to 1 beverage per day, no illicit drugs.   2. Risk factor reduction:  Advised patient of need for regular exercise and diet rich and fruits and vegetables to reduce risk of heart attack and stroke. Exercise- encouraged.  Wt Readings from Last 3 Encounters:  03/29/22 216 lb 4 oz (98.1 kg)  09/06/21 214 lb (97.1 kg)  09/05/21 215 lb 6.4 oz (97.7  kg)   3. Immunizations/screenings/ancillary studies Immunization History  Administered Date(s) Administered   Influenza,inj,Quad PF,6+ Mos 05/08/2017, 04/25/2018   PFIZER(Purple Top)SARS-COV-2 Vaccination 04/01/2020, 04/22/2020   Pfizer Covid-19 Vaccine Bivalent Booster 58yrs & up 02/10/2021   Tdap 07/27/2015   Health Maintenance Due  Topic Date Due   Hepatitis C Screening  Never done   PAP SMEAR-Modifier  01/09/2020    4. Cervical cancer screening- due in Jan 5. Breast cancer screening-  mammogram UTD 6. Colon cancer screening - UTD  Problem List Items Addressed This Visit       Digestive   GERD (gastroesophageal reflux disease)   Relevant Medications   pantoprazole (PROTONIX) 40 MG tablet     Endocrine   Goiter   Relevant Orders   TSH   T4, free   US THYROID     Other   Vitamin D deficiency   Relevant Orders   VITAMIN D 25 Hydroxy (Vit-D Deficiency, Fractures)   Other Visit Diagnoses     Wellness examination    -  Primary   Relevant Orders   Comprehensive metabolic panel   Hemoglobin A1c   Hepatitis C antibody   HIV Antibody (routine testing w rflx)   IBC + Ferritin   Lipid panel   TSH   T4, free   VITAMIN D 25 Hydroxy (Vit-D Deficiency, Fractures)   Vitamin B12   CBC with Differential/Platelet   Gastroesophageal reflux  disease       Relevant Medications   pantoprazole (PROTONIX) 40 MG tablet   Class 3 severe obesity due to excess calories with serious comorbidity and body mass index (BMI) of 40.0 to 44.9 in adult [facility])       Relevant Orders   Hemoglobin A1c   Encounter for hepatitis C screening test for low risk patient       Relevant Orders   Hepatitis C antibody   Screening for HIV without presence of risk factors       Relevant Orders   HIV Antibody (routine testing w rflx)   Iron deficiency anemia due to chronic blood loss       Relevant Orders   IBC + Ferritin   Vitamin B12   Need for shingles vaccine       Relevant Orders   Zoster Recombinant (Shingrix )     Wellness-anticipatory guidance.  Work on [employer].  Check CBC,CMP,lipids,TSH, A1C.  F/u 1 yr  GERD-.  Chronic.  Fairly well controlled.  Not taking Protonix daily.  Having some intermittent chest pain which may or may not be related.  She will see Dr. Jens Som.  Advised to take Protonix 40 mg daily.  Renewed.  Check magnesium and B12 Goiter-chronic.  Some mild dysphagia which may be GERD or enlarging goiter.  Check TSH, free T4.  Check ultrasound thyroid Iron deficiency anemia-history of.  Check CBC, iron panel, B12 5.  Vitamin D deficiency-check vitamin D we will  Recommended follow up: annual Return in about 1 year (around 03/30/2023) for annual. Future Appointments  Date Time Provider Department Center  04/11/2022 10:40 AM Lewayne Bunting, MD [facility]  08/10/2022  8:15 AM Jerene Bears, MD [facility]    Lab/Order associations:+ fasting   ICD-10-CM   1. Wellness examination  Z00.00 Comprehensive metabolic panel    Hemoglobin A1c    Hepatitis C antibody    HIV Antibody (routine testing w rflx)    IBC + Ferritin    Lipid panel    TSH  T4, free    VITAMIN D 25 Hydroxy (Vit-D Deficiency, Fractures)    Vitamin B12    CBC with Differential/Platelet    2. Gastroesophageal reflux disease  K21.9 pantoprazole (PROTONIX) 40  MG tablet    3. Gastroesophageal reflux disease without esophagitis  K21.9     4. Goiter  E04.9 TSH    T4, free    US THYROID    5. Class 3 severe obesity due to excess calories with serious comorbidity and body mass index (BMI) of 40.0 to 44.9 in adult (HCC)  E66.01 Hemoglobin A1c   Z68.41     6. Vitamin D deficiency  E55.9 VITAMIN D 25 Hydroxy (Vit-D Deficiency, Fractures)    7. Encounter for hepatitis C screening test for low risk patient  Z11.59 Hepatitis C antibody    8. Screening for HIV without presence of risk factors  Z11.4 HIV Antibody (routine testing w rflx)    9. Iron deficiency anemia due to chronic blood loss  D50.0 IBC + Ferritin    Vitamin B12    10. Need for shingles vaccine  Z23 Zoster Recombinant (Shingrix )      Meds ordered this encounter  Medications   pantoprazole (PROTONIX) 40 MG tablet    Sig: Take 1 tablet (40 mg total) by mouth daily. For heartburn.    Dispense:  90 tablet    Refill:  3    Angelena Sole, MD

## 2022-03-29 NOTE — Patient Instructions (Signed)
It was very nice to see you today!  Work on exercise     PLEASE NOTE:  If you had any lab tests please let us know if you have not heard back within a few days. You may see your results on MyChart before we have a chance to review them but we will give you a call once they are reviewed by us. If we ordered any referrals today, please let us know if you have not heard from their office within the next week.   Please try these tips to maintain a healthy lifestyle:  Eat most of your calories during the day when you are active. Eliminate processed foods including packaged sweets (pies, cakes, cookies), reduce intake of potatoes, white bread, white pasta, and white rice. Look for whole grain options, oat flour or almond flour.  Each meal should contain half fruits/vegetables, one quarter protein, and one quarter carbs (no bigger than a computer mouse).  Cut down on sweet beverages. This includes juice, soda, and sweet tea. Also watch fruit intake, though this is a healthier sweet option, it still contains natural sugar! Limit to 3 servings daily.  Drink at least 1 glass of water with each meal and aim for at least 8 glasses per day  Exercise at least 150 minutes every week.   

## 2022-03-30 LAB — HEPATITIS C ANTIBODY: Hepatitis C Ab: NONREACTIVE

## 2022-03-30 LAB — HIV ANTIBODY (ROUTINE TESTING W REFLEX): HIV 1&2 Ab, 4th Generation: NONREACTIVE

## 2022-04-01 NOTE — Progress Notes (Signed)
Thyroid ultrasound normal.  If having problems still, schedule appointment

## 2022-04-11 ENCOUNTER — Encounter: Payer: Self-pay | Admitting: Cardiology

## 2022-04-11 ENCOUNTER — Ambulatory Visit: Payer: 59 | Attending: Cardiology | Admitting: Cardiology

## 2022-04-11 ENCOUNTER — Ambulatory Visit: Payer: 59 | Admitting: Cardiology

## 2022-04-11 VITALS — BP 128/88 | HR 90 | Ht 61.0 in | Wt 216.0 lb

## 2022-04-11 DIAGNOSIS — I10 Essential (primary) hypertension: Secondary | ICD-10-CM | POA: Diagnosis not present

## 2022-04-11 DIAGNOSIS — E669 Obesity, unspecified: Secondary | ICD-10-CM

## 2022-04-11 MED ORDER — IRBESARTAN 150 MG PO TABS
150.0000 mg | ORAL_TABLET | Freq: Every day | ORAL | 3 refills | Status: DC
  Filled 2022-06-06 – 2022-06-07 (×2): qty 90, 90d supply, fill #0
  Filled 2022-09-06: qty 90, 90d supply, fill #1

## 2022-04-11 NOTE — Patient Instructions (Signed)
Medication Instructions:   START IRBESARTAN 150 MG ONCE DAILY  *If you need a refill on your cardiac medications before your next appointment, please call your pharmacy*   Lab Work:  Your physician recommends that you return for lab work in: ONE WEEK-DO NOT NEED TO FAST  If you have labs (blood work) drawn today and your tests are completely normal, you will receive your results only by: MyChart Message (if you have MyChart) OR A paper copy in the mail If you have any lab test that is abnormal or we need to change your treatment, we will call you to review the results.   Follow-Up: At [facility], you and your health needs are our priority.  As part of our continuing mission to provide you with exceptional heart care, we have created designated Provider Care Teams.  These Care Teams include your primary Cardiologist (physician) and Advanced Practice Providers (APPs -  Physician Assistants and Nurse Practitioners) who all work together to provide you with the care you need, when you need it.  We recommend signing up for the patient portal called "MyChart".  Sign up information is provided on this After Visit Summary.  MyChart is used to connect with patients for Virtual Visits (Telemedicine).  Patients are able to view lab/test results, encounter notes, upcoming appointments, etc.  Non-urgent messages can be sent to your provider as well.   To learn more about what you can do with MyChart, go to [email or web address].    Your next appointment:   12 month(s)  The format for your next appointment:   In Person  Provider:   Olga Millers MD

## 2022-05-04 ENCOUNTER — Other Ambulatory Visit: Payer: Self-pay

## 2022-05-04 MED FILL — Estradiol TD Patch Twice Weekly 0.1 MG/24HR: TRANSDERMAL | 28 days supply | Qty: 8 | Fill #1 | Status: AC

## 2022-05-07 ENCOUNTER — Other Ambulatory Visit: Payer: Self-pay

## 2022-05-10 DIAGNOSIS — M9901 Segmental and somatic dysfunction of cervical region: Secondary | ICD-10-CM | POA: Diagnosis not present

## 2022-05-10 DIAGNOSIS — I10 Essential (primary) hypertension: Secondary | ICD-10-CM | POA: Diagnosis not present

## 2022-05-10 DIAGNOSIS — M50323 Other cervical disc degeneration at C6-C7 level: Secondary | ICD-10-CM | POA: Diagnosis not present

## 2022-05-10 DIAGNOSIS — M50321 Other cervical disc degeneration at C4-C5 level: Secondary | ICD-10-CM | POA: Diagnosis not present

## 2022-05-10 DIAGNOSIS — M9906 Segmental and somatic dysfunction of lower extremity: Secondary | ICD-10-CM | POA: Diagnosis not present

## 2022-05-10 DIAGNOSIS — M531 Cervicobrachial syndrome: Secondary | ICD-10-CM | POA: Diagnosis not present

## 2022-05-10 DIAGNOSIS — M50322 Other cervical disc degeneration at C5-C6 level: Secondary | ICD-10-CM | POA: Diagnosis not present

## 2022-05-10 DIAGNOSIS — M4312 Spondylolisthesis, cervical region: Secondary | ICD-10-CM | POA: Diagnosis not present

## 2022-05-11 LAB — BASIC METABOLIC PANEL
BUN/Creatinine Ratio: 13 (ref 9–23)
BUN: 11 mg/dL (ref 6–24)
CO2: 23 mmol/L (ref 20–29)
Calcium: 9.7 mg/dL (ref 8.7–10.2)
Chloride: 103 mmol/L (ref 96–106)
Creatinine, Ser: 0.84 mg/dL (ref 0.57–1.00)
Glucose: 81 mg/dL (ref 70–99)
Potassium: 4.2 mmol/L (ref 3.5–5.2)
Sodium: 139 mmol/L (ref 134–144)
eGFR: 85 mL/min/{1.73_m2} (ref >59)

## 2022-05-14 ENCOUNTER — Encounter: Payer: Self-pay | Admitting: *Deleted

## 2022-05-15 DIAGNOSIS — M50323 Other cervical disc degeneration at C6-C7 level: Secondary | ICD-10-CM | POA: Diagnosis not present

## 2022-05-15 DIAGNOSIS — M50322 Other cervical disc degeneration at C5-C6 level: Secondary | ICD-10-CM | POA: Diagnosis not present

## 2022-05-15 DIAGNOSIS — M9901 Segmental and somatic dysfunction of cervical region: Secondary | ICD-10-CM | POA: Diagnosis not present

## 2022-05-15 DIAGNOSIS — M531 Cervicobrachial syndrome: Secondary | ICD-10-CM | POA: Diagnosis not present

## 2022-05-15 DIAGNOSIS — M50321 Other cervical disc degeneration at C4-C5 level: Secondary | ICD-10-CM | POA: Diagnosis not present

## 2022-05-15 DIAGNOSIS — M9906 Segmental and somatic dysfunction of lower extremity: Secondary | ICD-10-CM | POA: Diagnosis not present

## 2022-05-15 DIAGNOSIS — M4312 Spondylolisthesis, cervical region: Secondary | ICD-10-CM | POA: Diagnosis not present

## 2022-05-17 DIAGNOSIS — M50321 Other cervical disc degeneration at C4-C5 level: Secondary | ICD-10-CM | POA: Diagnosis not present

## 2022-05-17 DIAGNOSIS — M531 Cervicobrachial syndrome: Secondary | ICD-10-CM | POA: Diagnosis not present

## 2022-05-17 DIAGNOSIS — M9901 Segmental and somatic dysfunction of cervical region: Secondary | ICD-10-CM | POA: Diagnosis not present

## 2022-05-17 DIAGNOSIS — M50323 Other cervical disc degeneration at C6-C7 level: Secondary | ICD-10-CM | POA: Diagnosis not present

## 2022-05-17 DIAGNOSIS — M9906 Segmental and somatic dysfunction of lower extremity: Secondary | ICD-10-CM | POA: Diagnosis not present

## 2022-05-17 DIAGNOSIS — M50322 Other cervical disc degeneration at C5-C6 level: Secondary | ICD-10-CM | POA: Diagnosis not present

## 2022-05-17 DIAGNOSIS — M4312 Spondylolisthesis, cervical region: Secondary | ICD-10-CM | POA: Diagnosis not present

## 2022-05-22 DIAGNOSIS — M4312 Spondylolisthesis, cervical region: Secondary | ICD-10-CM | POA: Diagnosis not present

## 2022-05-22 DIAGNOSIS — M9901 Segmental and somatic dysfunction of cervical region: Secondary | ICD-10-CM | POA: Diagnosis not present

## 2022-05-22 DIAGNOSIS — M531 Cervicobrachial syndrome: Secondary | ICD-10-CM | POA: Diagnosis not present

## 2022-05-22 DIAGNOSIS — M50321 Other cervical disc degeneration at C4-C5 level: Secondary | ICD-10-CM | POA: Diagnosis not present

## 2022-05-22 DIAGNOSIS — M9906 Segmental and somatic dysfunction of lower extremity: Secondary | ICD-10-CM | POA: Diagnosis not present

## 2022-05-22 DIAGNOSIS — M50323 Other cervical disc degeneration at C6-C7 level: Secondary | ICD-10-CM | POA: Diagnosis not present

## 2022-05-22 DIAGNOSIS — M50322 Other cervical disc degeneration at C5-C6 level: Secondary | ICD-10-CM | POA: Diagnosis not present

## 2022-05-28 ENCOUNTER — Encounter: Payer: Self-pay | Admitting: *Deleted

## 2022-06-06 ENCOUNTER — Other Ambulatory Visit: Payer: Self-pay

## 2022-06-06 ENCOUNTER — Other Ambulatory Visit (HOSPITAL_BASED_OUTPATIENT_CLINIC_OR_DEPARTMENT_OTHER): Payer: Self-pay

## 2022-06-06 ENCOUNTER — Other Ambulatory Visit (HOSPITAL_COMMUNITY): Payer: Self-pay

## 2022-06-07 ENCOUNTER — Other Ambulatory Visit (HOSPITAL_BASED_OUTPATIENT_CLINIC_OR_DEPARTMENT_OTHER): Payer: Self-pay

## 2022-06-07 ENCOUNTER — Other Ambulatory Visit (HOSPITAL_BASED_OUTPATIENT_CLINIC_OR_DEPARTMENT_OTHER): Payer: Self-pay | Admitting: Obstetrics & Gynecology

## 2022-06-07 ENCOUNTER — Other Ambulatory Visit (HOSPITAL_COMMUNITY): Payer: Self-pay

## 2022-06-08 ENCOUNTER — Other Ambulatory Visit (HOSPITAL_BASED_OUTPATIENT_CLINIC_OR_DEPARTMENT_OTHER): Payer: Self-pay

## 2022-06-08 ENCOUNTER — Other Ambulatory Visit (HOSPITAL_COMMUNITY): Payer: Self-pay

## 2022-06-08 ENCOUNTER — Other Ambulatory Visit: Payer: Self-pay

## 2022-06-08 MED ORDER — ESTRADIOL 0.1 MG/24HR TD PTTW
MEDICATED_PATCH | TRANSDERMAL | 1 refills | Status: DC
  Filled 2022-06-08 (×3): qty 8, 28d supply, fill #0

## 2022-06-11 ENCOUNTER — Other Ambulatory Visit (HOSPITAL_COMMUNITY): Payer: Self-pay

## 2022-06-25 ENCOUNTER — Encounter: Payer: Self-pay | Admitting: Cardiology

## 2022-06-25 ENCOUNTER — Other Ambulatory Visit (HOSPITAL_BASED_OUTPATIENT_CLINIC_OR_DEPARTMENT_OTHER): Payer: Self-pay

## 2022-06-25 DIAGNOSIS — I10 Essential (primary) hypertension: Secondary | ICD-10-CM

## 2022-06-25 MED ORDER — HYDROCHLOROTHIAZIDE 25 MG PO TABS
25.0000 mg | ORAL_TABLET | Freq: Every day | ORAL | 3 refills | Status: AC
  Filled 2022-06-25: qty 90, 90d supply, fill #0
  Filled 2022-09-06: qty 90, 90d supply, fill #1

## 2022-07-19 ENCOUNTER — Encounter: Payer: Self-pay | Admitting: *Deleted

## 2022-08-10 ENCOUNTER — Other Ambulatory Visit (HOSPITAL_COMMUNITY): Payer: Self-pay

## 2022-08-10 ENCOUNTER — Encounter (HOSPITAL_BASED_OUTPATIENT_CLINIC_OR_DEPARTMENT_OTHER): Payer: Self-pay | Admitting: Obstetrics & Gynecology

## 2022-08-10 ENCOUNTER — Ambulatory Visit (INDEPENDENT_AMBULATORY_CARE_PROVIDER_SITE_OTHER): Payer: Commercial Managed Care - PPO | Admitting: Obstetrics & Gynecology

## 2022-08-10 ENCOUNTER — Other Ambulatory Visit (HOSPITAL_BASED_OUTPATIENT_CLINIC_OR_DEPARTMENT_OTHER): Payer: Self-pay

## 2022-08-10 ENCOUNTER — Other Ambulatory Visit (HOSPITAL_COMMUNITY)
Admission: RE | Admit: 2022-08-10 | Discharge: 2022-08-10 | Disposition: A | Discharge status: Home / Self Care | Payer: Commercial Managed Care - PPO | Source: Ambulatory Visit | Attending: Obstetrics & Gynecology | Admitting: Obstetrics & Gynecology

## 2022-08-10 VITALS — BP 136/82 | HR 80 | Ht 61.0 in | Wt 222.4 lb

## 2022-08-10 DIAGNOSIS — Z90710 Acquired absence of both cervix and uterus: Secondary | ICD-10-CM | POA: Diagnosis not present

## 2022-08-10 DIAGNOSIS — I10 Essential (primary) hypertension: Secondary | ICD-10-CM | POA: Diagnosis not present

## 2022-08-10 DIAGNOSIS — Z01419 Encounter for gynecological examination (general) (routine) without abnormal findings: Secondary | ICD-10-CM

## 2022-08-10 DIAGNOSIS — N898 Other specified noninflammatory disorders of vagina: Secondary | ICD-10-CM

## 2022-08-10 DIAGNOSIS — Z800 Family history of malignant neoplasm of digestive organs: Secondary | ICD-10-CM | POA: Diagnosis not present

## 2022-08-10 DIAGNOSIS — R232 Flushing: Secondary | ICD-10-CM

## 2022-08-10 DIAGNOSIS — R6882 Decreased libido: Secondary | ICD-10-CM

## 2022-08-10 DIAGNOSIS — N941 Unspecified dyspareunia: Secondary | ICD-10-CM | POA: Diagnosis not present

## 2022-08-10 MED ORDER — ESTRADIOL 0.1 MG/24HR TD PTTW
MEDICATED_PATCH | TRANSDERMAL | 4 refills | Status: DC
  Filled 2022-08-10 – 2022-09-06 (×4): qty 24, 84d supply, fill #0

## 2022-08-10 NOTE — Progress Notes (Addendum)
52 y.o. Kristen [name] Married [place] or [place] American female here for annual exam.  The holidays were hard.  Her mother passed 09/19/2021.  She has gotten married since I saw her last.    Using vivelle dot patch.  The generic ones cause blisters.  She is having some issues with pain after intercourse and having some libido issues.  Feels like she has little paper cuts in the vagina.  Takes two days to feel back to normal.  Has only tried lubricant.  Patient's last menstrual period was 01/27/2017 (exact date).          Sexually active: Yes.    The current method of family planning is status post hysterectomy.    Smoker:  no  Health Maintenance: Pap:  2018 History of abnormal Pap:  remote hx of dysplasia 1993 MMG:  09/2021 Colonoscopy:  09/2021, follow up 5 years BMD:   not indicated Screening Labs: 03/2022   reports that she has never smoked. She has never used smokeless tobacco. She reports that she does not drink alcohol and does not use drugs.  Past Medical History:  Diagnosis Date   Allergy    Anxiety    Arthritis    per pt in her hands   Asthma    seasonal asthma, has not required use of inhaler in over 2 years   Cervical dysplasia 1993   COVID-19 09/13/2020   Depression    Diverticulitis    Essential hypertension    GERD (gastroesophageal reflux disease)    Headache    history of migraines   IBS (irritable bowel syndrome)    STD (sexually transmitted disease) ~1998   chlamydia     Past Surgical History:  Procedure Laterality Date   ADENOIDECTOMY     lymph nodes removed   APPENDECTOMY  1990   COLONOSCOPY     CYSTOSCOPY N/A 02/12/2017   Procedure: CYSTOSCOPY;  Surgeon: Megan Salon, MD;  Location: [facility];  Service: Gynecology;  Laterality: N/A;   TONSILLECTOMY     TOTAL LAPAROSCOPIC HYSTERECTOMY WITH SALPINGECTOMY Bilateral 02/12/2017   Procedure: HYSTERECTOMY TOTAL LAPAROSCOPIC WITH SALPINGECTOMY;  Surgeon: Megan Salon, MD;  Location: [facility];  Service: Gynecology;   Laterality: Bilateral;   TUBAL LIGATION      Current Outpatient Medications  Medication Sig Dispense Refill   albuterol (VENTOLIN HFA) 108 (90 Base) MCG/ACT inhaler Inhale 2 puffs into the lungs every 4 (four) hours as needed. 1 each 0   hydrochlorothiazide (HYDRODIURIL) 25 MG tablet Take 1 tablet (25 mg total) by mouth daily. For blood pressure. 90 tablet 3   irbesartan (AVAPRO) 150 MG tablet Take 1 tablet (150 mg total) by mouth daily. 90 tablet 3   pantoprazole (PROTONIX) 40 MG tablet Take 1 tablet (40 mg total) by mouth daily for heartburn. 90 tablet 3   temazepam (RESTORIL) 15 MG capsule Take 1 capsule (15 mg total) by mouth at bedtime as needed for sleep. 30 capsule 0   estradiol (VIVELLE-DOT) 0.1 MG/24HR patch PLACE 1 PATCH ONTO THE SKIN 2 TIMES A WEEK. 24 patch 4   No current facility-administered medications for this visit.    Family History  Problem Relation Age of Onset   Uterine cancer Mother        mets to lungs   Hypertension Mother    Depression Mother    Colon cancer Father 39   Diabetes Brother    Diabetes Maternal Grandmother    Hypertension Maternal Grandmother    Stomach  cancer Neg Hx    Breast cancer Neg Hx    Esophageal cancer Neg Hx    Rectal cancer Neg Hx     ROS: Constitutional: negative Genitourinary:negative  Exam:   BP 136/82 (BP Location: Right Arm, Patient Position: Sitting, Cuff Size: Normal)   Pulse 80   Ht 5\' 1"  (1.549 m)   Wt 222 lb 6.4 oz (100.9 kg)   LMP 01/27/2017 (Exact Date)   BMI 42.02 kg/m   Height: 5\' 1"  (154.9 cm)  General appearance: alert, cooperative and appears stated age Head: Normocephalic, without obvious abnormality, atraumatic Neck: no adenopathy, supple, symmetrical, trachea midline and thyroid normal to inspection and palpation Lungs: clear to auscultation bilaterally Breasts: normal appearance, no masses or tenderness Heart: regular rate and rhythm Abdomen: soft, non-tender; bowel sounds normal; no masses,  no  organomegaly Extremities: extremities normal, atraumatic, no cyanosis or edema Skin: Skin color, texture, turgor normal. No rashes or lesions Lymph nodes: Cervical, supraclavicular, and axillary nodes normal. No abnormal inguinal nodes palpated Neurologic: Grossly normal   Pelvic: External genitalia:  no lesions              Urethra:  normal appearing urethra with no masses, tenderness or lesions              Bartholins and Skenes: normal                 Vagina: erythema noted, discharge noted, mild odor noted, no lesions              Cervix: absent              Pap taken: No. Bimanual Exam:  Uterus:  uterus absent              Adnexa: normal adnexa               Rectovaginal: Confirms               Anus:  normal sphincter tone, no lesions  Chaperone, Banham, Kimalexis, RMA, was present for exam.  Assessment/Plan: 1. Well woman exam with routine gynecological exam - Pap smear not indicated - Mammogram 09/2021 - Colonoscopy 2023.  Follow up 5 years - Bone mineral density  not indicated - lab work done with PCP - vaccines reviewed/updated  2. Hot flashes - estradiol (VIVELLE-DOT) 0.1 MG/24HR patch; PLACE 1 PATCH ONTO THE SKIN 2 TIMES A WEEK.  Dispense: 24 patch; Refill: 4 - Estradiol  3. H/O: hysterectomy - 2018  4. Primary hypertension - followed by Dr. Stanford Breed  5. Family history of colon cancer  6. Dyspareunia in female - with erythema and discharge, vaginitis testing obtained today

## 2022-08-11 LAB — ESTRADIOL: Estradiol: 67.5 pg/mL

## 2022-08-13 LAB — CERVICOVAGINAL ANCILLARY ONLY
Bacterial Vaginitis (gardnerella): NEGATIVE
Candida Glabrata: NEGATIVE
Candida Vaginitis: POSITIVE — AB
Chlamydia: NEGATIVE
Comment: NEGATIVE
Comment: NEGATIVE
Comment: NEGATIVE
Comment: NEGATIVE
Comment: NEGATIVE
Comment: NORMAL
Neisseria Gonorrhea: NEGATIVE
Trichomonas: POSITIVE — AB

## 2022-08-13 LAB — TESTOSTERONE, TOTAL, LC/MS/MS: Testosterone, total: 12.6 ng/dL

## 2022-08-14 ENCOUNTER — Other Ambulatory Visit (HOSPITAL_BASED_OUTPATIENT_CLINIC_OR_DEPARTMENT_OTHER): Payer: Self-pay | Admitting: *Deleted

## 2022-08-14 ENCOUNTER — Other Ambulatory Visit (HOSPITAL_BASED_OUTPATIENT_CLINIC_OR_DEPARTMENT_OTHER): Payer: Self-pay | Admitting: Advanced Practice Midwife

## 2022-08-14 DIAGNOSIS — A599 Trichomoniasis, unspecified: Secondary | ICD-10-CM

## 2022-08-14 MED ORDER — METRONIDAZOLE 500 MG PO TABS: ORAL_TABLET | ORAL | 1 refills | Status: DC

## 2022-08-14 MED ORDER — TERCONAZOLE 0.4 % VA CREA: 1.0000 | TOPICAL_CREAM | Freq: Every day | VAGINAL | 0 refills | Status: DC

## 2022-08-14 NOTE — Progress Notes (Signed)
Rx for Flagyl 2 g in a single dose sent to pt pharmacy.  Pt prefers single dosing. Expedited partner therapy with Rx for one female patient.

## 2022-08-21 ENCOUNTER — Other Ambulatory Visit (HOSPITAL_COMMUNITY): Payer: Self-pay

## 2022-08-23 ENCOUNTER — Other Ambulatory Visit (HOSPITAL_COMMUNITY): Payer: Self-pay

## 2022-09-06 ENCOUNTER — Other Ambulatory Visit (HOSPITAL_COMMUNITY): Payer: Self-pay

## 2022-09-07 ENCOUNTER — Other Ambulatory Visit (HOSPITAL_COMMUNITY): Payer: Self-pay

## 2022-09-10 DIAGNOSIS — J069 Acute upper respiratory infection, unspecified: Secondary | ICD-10-CM | POA: Diagnosis not present

## 2022-09-10 DIAGNOSIS — R051 Acute cough: Secondary | ICD-10-CM | POA: Diagnosis not present

## 2022-09-10 DIAGNOSIS — J029 Acute pharyngitis, unspecified: Secondary | ICD-10-CM | POA: Diagnosis not present

## 2023-01-09 ENCOUNTER — Other Ambulatory Visit: Payer: Self-pay | Admitting: Cardiology

## 2023-01-09 DIAGNOSIS — I10 Essential (primary) hypertension: Secondary | ICD-10-CM

## 2023-01-30 ENCOUNTER — Other Ambulatory Visit (HOSPITAL_COMMUNITY): Payer: Self-pay

## 2023-01-30 ENCOUNTER — Other Ambulatory Visit (HOSPITAL_BASED_OUTPATIENT_CLINIC_OR_DEPARTMENT_OTHER): Payer: Self-pay

## 2023-06-06 ENCOUNTER — Other Ambulatory Visit (HOSPITAL_COMMUNITY): Payer: Self-pay

## 2023-08-16 ENCOUNTER — Encounter (HOSPITAL_BASED_OUTPATIENT_CLINIC_OR_DEPARTMENT_OTHER): Payer: Self-pay | Admitting: Obstetrics & Gynecology

## 2023-08-19 ENCOUNTER — Ambulatory Visit (HOSPITAL_BASED_OUTPATIENT_CLINIC_OR_DEPARTMENT_OTHER): Payer: Commercial Managed Care - PPO | Admitting: Obstetrics & Gynecology

## 2023-08-21 ENCOUNTER — Encounter (HOSPITAL_BASED_OUTPATIENT_CLINIC_OR_DEPARTMENT_OTHER): Payer: Self-pay | Admitting: Obstetrics & Gynecology

## 2023-08-21 ENCOUNTER — Ambulatory Visit (INDEPENDENT_AMBULATORY_CARE_PROVIDER_SITE_OTHER): Payer: BC Managed Care – PPO | Admitting: Obstetrics & Gynecology

## 2023-08-21 ENCOUNTER — Other Ambulatory Visit (HOSPITAL_COMMUNITY)
Admission: RE | Admit: 2023-08-21 | Discharge: 2023-08-21 | Disposition: A | Discharge status: Home / Self Care | Payer: BC Managed Care – PPO | Source: Ambulatory Visit | Attending: Obstetrics & Gynecology | Admitting: Obstetrics & Gynecology

## 2023-08-21 VITALS — BP 124/80 | HR 75 | Ht 62.25 in | Wt 226.0 lb

## 2023-08-21 DIAGNOSIS — R232 Flushing: Secondary | ICD-10-CM

## 2023-08-21 DIAGNOSIS — N760 Acute vaginitis: Secondary | ICD-10-CM | POA: Diagnosis not present

## 2023-08-21 DIAGNOSIS — Z1231 Encounter for screening mammogram for malignant neoplasm of breast: Secondary | ICD-10-CM

## 2023-08-21 DIAGNOSIS — N898 Other specified noninflammatory disorders of vagina: Secondary | ICD-10-CM

## 2023-08-21 DIAGNOSIS — Z01419 Encounter for gynecological examination (general) (routine) without abnormal findings: Secondary | ICD-10-CM | POA: Diagnosis not present

## 2023-08-21 DIAGNOSIS — G4709 Other insomnia: Secondary | ICD-10-CM

## 2023-08-21 MED ORDER — ESTRADIOL 0.1 MG/24HR TD PTTW: 1.0000 | MEDICATED_PATCH | TRANSDERMAL | 2 refills | Status: DC

## 2023-08-21 MED ORDER — TRAZODONE HCL 50 MG PO TABS: 50.0000 mg | ORAL_TABLET | Freq: Every day | ORAL | 1 refills | Status: DC

## 2023-08-21 MED ORDER — FLUCONAZOLE 150 MG PO TABS: ORAL_TABLET | ORAL | 0 refills | Status: DC

## 2023-08-21 NOTE — Progress Notes (Signed)
 53 y.o. Kristen [name] Married [place] or [place] American female here for annual exam.  Doing well.  Holidays were hard.  Last her mom in 2023.    Denies vaginal bleeding.  Having painful insertion.  Has used some lubrication.  Using vagisil product and this has helped a little.  Treatment options discussed.  Reports she feels like she is "short" with her family.  Has been on Zoloft  in the past.  It was 50mg  but this didn't seem to make that big of a difference for her.  Continues to have issues with sleep.  Used restoril  15mg  dosage.  This just made her feel too groggy.    Sometimes she feels she has some vaginal odor.    Patient's last menstrual period was 01/27/2017 (exact date).          Sexually active: Yes.    The current method of family planning is status post hysterectomy.    Smoker:  no  Health Maintenance: Pap:  01/08/2017 Negative History of abnormal Pap:  remote hx MMG:  09/13/2021 Negative.  She knows this is overdue.   Colonoscopy:  09/06/2021 BMD:   no indicated Screening Labs: does with Dr. Leontine Rana   reports that she has never smoked. She has never used smokeless tobacco. She reports that she does not drink alcohol and does not use drugs.  Past Medical History:  Diagnosis Date   Allergy    Anxiety    Arthritis    per pt in her hands   Asthma    seasonal asthma, has not required use of inhaler in over 2 years   Cervical dysplasia 1993   COVID-19 09/13/2020   Depression    Diverticulitis    Essential hypertension    GERD (gastroesophageal reflux disease)    Headache    history of migraines   IBS (irritable bowel syndrome)    STD (sexually transmitted disease) ~1998   chlamydia     Past Surgical History:  Procedure Laterality Date   ADENOIDECTOMY     lymph nodes removed   APPENDECTOMY  1990   COLONOSCOPY     CYSTOSCOPY N/A 02/12/2017   Procedure: CYSTOSCOPY;  Surgeon: Lillian Rein, MD;  Location: [facility];  Service: Gynecology;  Laterality: N/A;    TONSILLECTOMY     TOTAL LAPAROSCOPIC HYSTERECTOMY WITH SALPINGECTOMY Bilateral 02/12/2017   Procedure: HYSTERECTOMY TOTAL LAPAROSCOPIC WITH SALPINGECTOMY;  Surgeon: Lillian Rein, MD;  Location: [facility];  Service: Gynecology;  Laterality: Bilateral;   TUBAL LIGATION      Current Outpatient Medications  Medication Sig Dispense Refill   albuterol  (VENTOLIN  HFA) 108 (90 Base) MCG/ACT inhaler Inhale 2 puffs into the lungs every 4 (four) hours as needed. 1 each 0   [START ON 08/22/2023] estradiol  (VIVELLE -DOT) 0.1 MG/24HR patch Place 1 patch (0.1 mg total) onto the skin 2 (two) times a week. 8 patch 2   hydrochlorothiazide  (HYDRODIURIL ) 25 MG tablet Take 1 tablet (25 mg total) by mouth daily. For blood pressure. 90 tablet 3   irbesartan  (AVAPRO ) 150 MG tablet TAKE 1 TABLET BY MOUTH EVERY DAY 90 tablet 3   pantoprazole  (PROTONIX ) 40 MG tablet Take 1 tablet (40 mg total) by mouth daily for heartburn. 90 tablet 3   traZODone  (DESYREL ) 50 MG tablet Take 1 tablet (50 mg total) by mouth at bedtime. 30 tablet 1   No current facility-administered medications for this visit.    Family History  Problem Relation Age of Onset   Uterine cancer Mother  mets to lungs   Hypertension Mother    Depression Mother    Colon cancer Father 33   Diabetes Brother    Diabetes Maternal Grandmother    Hypertension Maternal Grandmother    Stomach cancer Neg Hx    Breast cancer Neg Hx    Esophageal cancer Neg Hx    Rectal cancer Neg Hx     ROS: Constitutional: negative Genitourinary:negative  Exam:   BP 124/80 (BP Location: Right Arm, Patient Position: Sitting, Cuff Size: Large)   Pulse 75   Ht 5' 2.25" (1.581 m)   Wt 226 lb (102.5 kg)   LMP 01/27/2017 (Exact Date)   BMI 41.00 kg/m   Height: 5' 2.25" (158.1 cm)  General appearance: alert, cooperative and appears stated age Head: Normocephalic, without obvious abnormality, atraumatic Neck: no adenopathy, supple, symmetrical, trachea midline and  thyroid  normal to inspection and palpation Lungs: clear to auscultation bilaterally Breasts: normal appearance, no masses or tenderness Heart: regular rate and rhythm Abdomen: soft, non-tender; bowel sounds normal; no masses,  no organomegaly Extremities: extremities normal, atraumatic, no cyanosis or edema Skin: Skin color, texture, turgor normal. No rashes or lesions Lymph nodes: Cervical, supraclavicular, and axillary nodes normal. No abnormal inguinal nodes palpated Neurologic: Grossly normal  Pelvic: External genitalia:  no lesions              Urethra:  normal appearing urethra with no masses, tenderness or lesions              Bartholins and Skenes: normal                 Vagina: normal appearing vagina with normal color and no discharge, no lesions              Cervix: absent              Pap taken: No. Bimanual Exam:  Uterus:  uterus absent              Adnexa: no mass, fullness, tenderness               Rectovaginal: Confirms               Anus:  normal sphincter tone, no lesions  Chaperone, Arnetta Bianchi, CMA, was present for exam.  Assessment/Plan: 1. Well woman exam with routine gynecological exam (Primary) - Pap smear 01/2017 - Mammogram 2023.  Pt is aware this is overdue. - Colonoscopy 2023, follow up 5 years - Bone mineral density not indicated - lab work done with PCP, Dr. Geroldine Kotyk - vaccines reviewed/updated  2. Other insomnia - will try trazodone  - traZODone  (DESYREL ) 50 MG tablet; Take 1 tablet (50 mg total) by mouth at bedtime.  Dispense: 30 tablet; Refill: 1  3. Vaginal odor - Cervicovaginal ancillary only( [place])  4. Encounter for screening mammogram for malignant neoplasm of breast - MM 3D SCREENING MAMMOGRAM BILATERAL BREAST; Future  5. Hot flashes - estradiol  (VIVELLE -DOT) 0.1 MG/24HR patch; Place 1 patch (0.1 mg total) onto the skin 2 (two) times a week.  Dispense: 8 patch; Refill: 2  6. Acute vaginitis - will treated with antifungal.  If  vaginal discomfort does not improved, will try vaginal estrogen - fluconazole  (DIFLUCAN ) 150 MG tablet; 1 tablet ever 3 days for 3 doses  Dispense: 3 tablet; Refill: 0

## 2023-08-23 LAB — CERVICOVAGINAL ANCILLARY ONLY
Bacterial Vaginitis (gardnerella): NEGATIVE
Candida Glabrata: NEGATIVE
Candida Vaginitis: NEGATIVE
Comment: NEGATIVE
Comment: NEGATIVE
Comment: NEGATIVE

## 2023-08-25 ENCOUNTER — Encounter (HOSPITAL_BASED_OUTPATIENT_CLINIC_OR_DEPARTMENT_OTHER): Payer: Self-pay | Admitting: Obstetrics & Gynecology

## 2023-08-26 ENCOUNTER — Other Ambulatory Visit: Payer: Self-pay

## 2023-08-26 ENCOUNTER — Other Ambulatory Visit (HOSPITAL_BASED_OUTPATIENT_CLINIC_OR_DEPARTMENT_OTHER): Payer: Self-pay | Admitting: Obstetrics & Gynecology

## 2023-08-26 DIAGNOSIS — N941 Unspecified dyspareunia: Secondary | ICD-10-CM

## 2023-08-26 MED ORDER — ESTRADIOL 0.1 MG/GM VA CREA
TOPICAL_CREAM | VAGINAL | 61 refills | Status: AC
  Filled 2023-08-26: qty 42.5, 30d supply, fill #0

## 2023-08-27 ENCOUNTER — Other Ambulatory Visit: Payer: Self-pay

## 2023-09-10 ENCOUNTER — Other Ambulatory Visit (HOSPITAL_BASED_OUTPATIENT_CLINIC_OR_DEPARTMENT_OTHER): Payer: Self-pay | Admitting: Obstetrics & Gynecology

## 2023-09-10 DIAGNOSIS — R232 Flushing: Secondary | ICD-10-CM

## 2023-09-12 ENCOUNTER — Other Ambulatory Visit (HOSPITAL_BASED_OUTPATIENT_CLINIC_OR_DEPARTMENT_OTHER): Payer: Self-pay | Admitting: Obstetrics & Gynecology

## 2023-09-12 DIAGNOSIS — G4709 Other insomnia: Secondary | ICD-10-CM

## 2023-09-18 ENCOUNTER — Ambulatory Visit
Admission: RE | Admit: 2023-09-18 | Discharge: 2023-09-18 | Disposition: A | Discharge status: Home / Self Care | Payer: BC Managed Care – PPO | Source: Ambulatory Visit | Attending: Obstetrics & Gynecology | Admitting: Obstetrics & Gynecology

## 2023-09-18 DIAGNOSIS — Z1231 Encounter for screening mammogram for malignant neoplasm of breast: Secondary | ICD-10-CM

## 2023-11-28 ENCOUNTER — Encounter (HOSPITAL_BASED_OUTPATIENT_CLINIC_OR_DEPARTMENT_OTHER): Payer: Self-pay | Admitting: Obstetrics & Gynecology

## 2023-12-02 ENCOUNTER — Telehealth (INDEPENDENT_AMBULATORY_CARE_PROVIDER_SITE_OTHER): Admitting: Obstetrics & Gynecology

## 2023-12-02 ENCOUNTER — Encounter (HOSPITAL_BASED_OUTPATIENT_CLINIC_OR_DEPARTMENT_OTHER): Payer: Self-pay | Admitting: Obstetrics & Gynecology

## 2023-12-02 DIAGNOSIS — R232 Flushing: Secondary | ICD-10-CM | POA: Diagnosis not present

## 2023-12-02 DIAGNOSIS — R6882 Decreased libido: Secondary | ICD-10-CM

## 2023-12-02 DIAGNOSIS — G4700 Insomnia, unspecified: Secondary | ICD-10-CM

## 2023-12-02 MED ORDER — ESTRADIOL 0.1 MG/24HR TD PTTW: 1.0000 | MEDICATED_PATCH | TRANSDERMAL | Status: DC

## 2023-12-02 MED ORDER — NORETHINDRONE ACETATE 5 MG PO TABS: 5.0000 mg | ORAL_TABLET | Freq: Every day | ORAL | 1 refills | Status: DC

## 2023-12-02 MED ORDER — NONFORMULARY OR COMPOUNDED ITEM: 1 refills | Status: AC

## 2023-12-02 NOTE — Progress Notes (Signed)
 Virtual Visit via Video Note  I connected with Kristen [name] on 12/02/23 at  1:55 PM EDT by a video enabled telemedicine application and verified that I am speaking with the correct person using two identifiers.  Location: Patient: home Provider: office   I discussed the limitations of evaluation and management by telemedicine and the availability of in person appointments. The patient expressed understanding and agreed to proceed.  History of Present Illness: 53 yo [id number] MAAF with hx of hysterectomy in 2018 who then started HRT later in the calendar year.  She is using estradiol  patch 0.1mg  patches twice weekly and having good control of symptoms.  She is using vaginal estradiol  cream 2 grams pv twice weekly.  She is also using vaginal moisturizer and this has helped with vaginal "pins and needles" sensation she was having.  Painful intercourse is improved as well.    Right now, her biggest issue, is sleep.  Usually, she tries to go to sleep around 10pm.  She is wearing an [place] ring.  This is telling her she wakes up 3-4 times a night.  Husband's alarm goes off around 3:45AM and this wakes her up as well.  So, she just isn't getting any restful sleep.  She did have a sleep study years ago and didn't have sleep apnea at that time.    She has considered using topical testosterone  as well due to decreased libido.  Now that she has a better handle on vaginal pain/dyspareunia, she would like to start this.  Side effects discussed.  Does not typically have acne.  [facility]     Observations/Objective: WNWD, NAD  Assessment and Plan: 1. Hot flashes - will continue estradiol  patches.  Updated the manufacturer that she wants to use. - estradiol  (VIVELLE -DOT) 0.1 MG/24HR patch; Place 1 patch (0.1 mg total) onto the skin 2 (two) times a week.  2. Insomnia, unspecified type (Primary) - Ambulatory referral to Neurology - will also start oral progesterone ,  norethindrone 5mg  at bedtime.  #30/1RF. - she will give update in two weeks  3. Decreased libido - will start topical testosterone  - NONFORMULARY OR COMPOUNDED ITEM; Topical testosterone  cream 1mg /0.56ml.  Apply topically 0.1ml topically three times weekly.  Disp: 3 month supply.  #1RF.  Dispense: 1 each; Refill: 1   Follow Up Instructions: I discussed the assessment and treatment plan with the patient. The patient was provided an opportunity to ask questions and all were answered. The patient agreed with the plan and demonstrated an understanding of the instructions.   The patient was advised to call back or seek an in-person evaluation if the symptoms worsen or if the condition fails to improve as anticipated.  I provided 23 minutes of non-face-to-face time during this encounter.   Lillian Rein, MD

## 2023-12-25 ENCOUNTER — Other Ambulatory Visit (HOSPITAL_BASED_OUTPATIENT_CLINIC_OR_DEPARTMENT_OTHER): Payer: Self-pay | Admitting: Obstetrics & Gynecology

## 2024-05-12 ENCOUNTER — Other Ambulatory Visit (HOSPITAL_BASED_OUTPATIENT_CLINIC_OR_DEPARTMENT_OTHER): Payer: Self-pay | Admitting: Obstetrics & Gynecology

## 2024-05-12 DIAGNOSIS — R232 Flushing: Secondary | ICD-10-CM

## 2024-06-25 ENCOUNTER — Other Ambulatory Visit (HOSPITAL_BASED_OUTPATIENT_CLINIC_OR_DEPARTMENT_OTHER): Payer: Self-pay | Admitting: Obstetrics & Gynecology

## 2024-08-27 ENCOUNTER — Other Ambulatory Visit: Payer: Self-pay | Admitting: Internal Medicine

## 2024-08-27 DIAGNOSIS — Z1231 Encounter for screening mammogram for malignant neoplasm of breast: Secondary | ICD-10-CM

## 2024-10-26 ENCOUNTER — Ambulatory Visit
